# Patient Record
Sex: Male | Born: 1995 | Race: Black or African American | Hispanic: No | Marital: Single | Smoking: Current some day smoker
Health system: Southern US, Community
[De-identification: ages and names within clinical notes are randomized; demographics above are authoritative.]

## PROBLEM LIST (undated history)

## (undated) DIAGNOSIS — Q431 Hirschsprung's disease: Secondary | ICD-10-CM

## (undated) NOTE — *Deleted (*Deleted)
  Problem: Education: Goal: Knowledge of General Education information will improve Description: Including pain rating scale, medication(s)/side effects and non-pharmacologic comfort measures Outcome: Progressing   Problem: Health Behavior/Discharge Planning: Goal: Ability to manage health-related needs will improve Outcome: Progressing   Problem: Clinical Measurements: Goal: Ability to maintain clinical measurements within normal limits will improve Outcome: Progressing Goal: Will remain free from infection Outcome: Progressing Goal: Diagnostic test results will improve Outcome: Progressing Goal: Respiratory complications will improve Outcome: Progressing Goal: Cardiovascular complication will be avoided Outcome: Progressing   Problem: Activity: Goal: Risk for activity intolerance will decrease Outcome: Progressing   Problem: Nutrition: Goal: Adequate nutrition will be maintained Outcome: Progressing   Problem: Coping: Goal: Level of anxiety will decrease Outcome: Progressing   Problem: Elimination: Goal: Will not experience complications related to bowel motility Outcome: Progressing Goal: Will not experience complications related to urinary retention Outcome: Progressing   Problem: Pain Managment: Goal: General experience of comfort will improve Outcome: Progressing   Problem: Safety: Goal: Ability to remain free from injury will improve Outcome: Progressing   Problem: Skin Integrity: Goal: Risk for impaired skin integrity will decrease Outcome: Progressing   

---

## 2019-04-22 ENCOUNTER — Emergency Department (HOSPITAL_COMMUNITY): Payer: Medicaid Other

## 2019-04-22 ENCOUNTER — Other Ambulatory Visit: Payer: Self-pay

## 2019-04-22 ENCOUNTER — Inpatient Hospital Stay (HOSPITAL_COMMUNITY)
Admission: EM | Admit: 2019-04-22 | Discharge: 2019-04-25 | DRG: 389 | Disposition: A | Discharge status: Home / Self Care | Payer: Medicaid Other | Attending: Internal Medicine | Admitting: Internal Medicine

## 2019-04-22 ENCOUNTER — Encounter (HOSPITAL_COMMUNITY): Payer: Self-pay | Admitting: Emergency Medicine

## 2019-04-22 DIAGNOSIS — Z1159 Encounter for screening for other viral diseases: Secondary | ICD-10-CM

## 2019-04-22 DIAGNOSIS — R1032 Left lower quadrant pain: Secondary | ICD-10-CM | POA: Diagnosis not present

## 2019-04-22 DIAGNOSIS — D72829 Elevated white blood cell count, unspecified: Secondary | ICD-10-CM | POA: Diagnosis present

## 2019-04-22 DIAGNOSIS — K219 Gastro-esophageal reflux disease without esophagitis: Secondary | ICD-10-CM | POA: Diagnosis present

## 2019-04-22 DIAGNOSIS — E861 Hypovolemia: Secondary | ICD-10-CM | POA: Diagnosis present

## 2019-04-22 DIAGNOSIS — K5641 Fecal impaction: Secondary | ICD-10-CM | POA: Diagnosis present

## 2019-04-22 DIAGNOSIS — R000 Tachycardia, unspecified: Secondary | ICD-10-CM | POA: Diagnosis present

## 2019-04-22 DIAGNOSIS — Q431 Hirschsprung's disease: Secondary | ICD-10-CM

## 2019-04-22 DIAGNOSIS — F1290 Cannabis use, unspecified, uncomplicated: Secondary | ICD-10-CM | POA: Diagnosis present

## 2019-04-22 DIAGNOSIS — K5904 Chronic idiopathic constipation: Secondary | ICD-10-CM

## 2019-04-22 DIAGNOSIS — F419 Anxiety disorder, unspecified: Secondary | ICD-10-CM | POA: Diagnosis present

## 2019-04-22 DIAGNOSIS — E860 Dehydration: Secondary | ICD-10-CM | POA: Diagnosis present

## 2019-04-22 DIAGNOSIS — E871 Hypo-osmolality and hyponatremia: Secondary | ICD-10-CM | POA: Diagnosis present

## 2019-04-22 DIAGNOSIS — R109 Unspecified abdominal pain: Secondary | ICD-10-CM

## 2019-04-22 DIAGNOSIS — N179 Acute kidney failure, unspecified: Secondary | ICD-10-CM | POA: Diagnosis present

## 2019-04-22 DIAGNOSIS — F17210 Nicotine dependence, cigarettes, uncomplicated: Secondary | ICD-10-CM | POA: Diagnosis present

## 2019-04-22 DIAGNOSIS — K5909 Other constipation: Secondary | ICD-10-CM

## 2019-04-22 DIAGNOSIS — K5901 Slow transit constipation: Secondary | ICD-10-CM | POA: Diagnosis present

## 2019-04-22 DIAGNOSIS — Z720 Tobacco use: Secondary | ICD-10-CM

## 2019-04-22 HISTORY — DX: Hirschsprung's disease: Q43.1

## 2019-04-22 LAB — COMPREHENSIVE METABOLIC PANEL
ALT: 12 U/L (ref 0–44)
AST: 13 U/L — ABNORMAL LOW (ref 15–41)
Albumin: 3.4 g/dL — ABNORMAL LOW (ref 3.5–5.0)
Alkaline Phosphatase: 90 U/L (ref 38–126)
Anion gap: 15 (ref 5–15)
BUN: 19 mg/dL (ref 6–20)
CO2: 23 mmol/L (ref 22–32)
Calcium: 9.2 mg/dL (ref 8.9–10.3)
Chloride: 97 mmol/L — ABNORMAL LOW (ref 98–111)
Creatinine, Ser: 1.28 mg/dL — ABNORMAL HIGH (ref 0.61–1.24)
GFR calc Af Amer: >60 mL/min (ref >60)
GFR calc non Af Amer: >60 mL/min (ref >60)
Glucose, Bld: 121 mg/dL — ABNORMAL HIGH (ref 70–99)
Potassium: 5.1 mmol/L (ref 3.5–5.1)
Sodium: 135 mmol/L (ref 135–145)
Total Bilirubin: 0.5 mg/dL (ref 0.3–1.2)
Total Protein: 6.9 g/dL (ref 6.5–8.1)

## 2019-04-22 LAB — URINALYSIS, ROUTINE W REFLEX MICROSCOPIC
Bilirubin Urine: NEGATIVE
Glucose, UA: NEGATIVE mg/dL
Hgb urine dipstick: NEGATIVE
Ketones, ur: 20 mg/dL — AB
Leukocytes,Ua: NEGATIVE
Nitrite: NEGATIVE
Protein, ur: NEGATIVE mg/dL
Specific Gravity, Urine: >1.046 — ABNORMAL HIGH (ref 1.005–1.030)
pH: 5 (ref 5.0–8.0)

## 2019-04-22 LAB — CBC
HCT: 58.6 % — ABNORMAL HIGH (ref 39.0–52.0)
Hemoglobin: 19.5 g/dL — ABNORMAL HIGH (ref 13.0–17.0)
MCH: 28.3 pg (ref 26.0–34.0)
MCHC: 33.3 g/dL (ref 30.0–36.0)
MCV: 84.9 fL (ref 80.0–100.0)
Platelets: 447 10*3/uL — ABNORMAL HIGH (ref 150–400)
RBC: 6.9 MIL/uL — ABNORMAL HIGH (ref 4.22–5.81)
RDW: 13.7 % (ref 11.5–15.5)
WBC: 17.2 10*3/uL — ABNORMAL HIGH (ref 4.0–10.5)
nRBC: 0 % (ref 0.0–0.2)

## 2019-04-22 LAB — TSH: TSH: 1.342 u[IU]/mL (ref 0.350–4.500)

## 2019-04-22 LAB — LACTIC ACID, PLASMA
Lactic Acid, Venous: 2.2 mmol/L (ref 0.5–1.9)
Lactic Acid, Venous: 1.7 mmol/L (ref 0.5–1.9)

## 2019-04-22 LAB — SARS CORONAVIRUS 2 BY RT PCR (HOSPITAL ORDER, PERFORMED IN ~~LOC~~ HOSPITAL LAB): SARS Coronavirus 2: NEGATIVE

## 2019-04-22 LAB — LIPASE, BLOOD: Lipase: 16 U/L (ref 11–51)

## 2019-04-22 MED ORDER — IOHEXOL 300 MG/ML  SOLN
100.0000 mL | Freq: Once | INTRAMUSCULAR | Status: AC | PRN
  Administered 2019-04-22: 16:00:00 100 mL via INTRAVENOUS

## 2019-04-22 MED ORDER — SODIUM CHLORIDE 0.9 % IV BOLUS
1000.0000 mL | Freq: Once | INTRAVENOUS | Status: DC
  Administered 2019-04-22: 1000 mL via INTRAVENOUS

## 2019-04-22 MED ORDER — ONDANSETRON HCL 4 MG/2ML IJ SOLN: 4.0000 mg | Freq: Four times a day (QID) | INTRAMUSCULAR | Status: DC | PRN

## 2019-04-22 MED ORDER — ACETAMINOPHEN 325 MG PO TABS
650.0000 mg | ORAL_TABLET | Freq: Four times a day (QID) | ORAL | Status: DC | PRN
  Administered 2019-04-22 – 2019-04-24 (×2): 650 mg via ORAL
  Filled 2019-04-22 (×2): qty 2

## 2019-04-22 MED ORDER — ACETAMINOPHEN 650 MG RE SUPP: 650.0000 mg | Freq: Four times a day (QID) | RECTAL | Status: DC | PRN

## 2019-04-22 MED ORDER — HYDROXYZINE HCL 25 MG PO TABS: 50.0000 mg | ORAL_TABLET | Freq: Four times a day (QID) | ORAL | Status: DC | PRN

## 2019-04-22 MED ORDER — SODIUM CHLORIDE 0.9 % IV BOLUS
1000.0000 mL | Freq: Once | INTRAVENOUS | Status: AC
  Administered 2019-04-22: 17:00:00 1000 mL via INTRAVENOUS

## 2019-04-22 MED ORDER — HYDROXYZINE HCL 25 MG PO TABS
25.0000 mg | ORAL_TABLET | Freq: Four times a day (QID) | ORAL | Status: DC | PRN
  Administered 2019-04-22: 25 mg via ORAL
  Filled 2019-04-22: qty 1

## 2019-04-22 MED ORDER — ONDANSETRON HCL 4 MG PO TABS: 4.0000 mg | ORAL_TABLET | Freq: Four times a day (QID) | ORAL | Status: DC | PRN

## 2019-04-22 MED ORDER — SENNA 8.6 MG PO TABS
2.0000 | ORAL_TABLET | Freq: Two times a day (BID) | ORAL | Status: DC
  Administered 2019-04-22 – 2019-04-24 (×3): 17.2 mg via ORAL
  Filled 2019-04-22 (×4): qty 2

## 2019-04-22 MED ORDER — HEPARIN SODIUM (PORCINE) 5000 UNIT/ML IJ SOLN
5000.0000 [IU] | Freq: Three times a day (TID) | INTRAMUSCULAR | Status: DC
  Filled 2019-04-22 (×2): qty 1

## 2019-04-22 MED ORDER — POLYETHYLENE GLYCOL 3350 17 G PO PACK
17.0000 g | PACK | Freq: Once | ORAL | Status: DC
  Filled 2019-04-22: qty 1

## 2019-04-22 MED ORDER — SODIUM CHLORIDE 0.9% FLUSH: 3.0000 mL | Freq: Once | INTRAVENOUS | Status: DC

## 2019-04-22 MED ORDER — SODIUM CHLORIDE 0.9% FLUSH
3.0000 mL | Freq: Two times a day (BID) | INTRAVENOUS | Status: DC
  Administered 2019-04-22 – 2019-04-24 (×4): 3 mL via INTRAVENOUS

## 2019-04-22 MED ORDER — POLYETHYLENE GLYCOL 3350 17 G PO PACK
17.0000 g | PACK | Freq: Two times a day (BID) | ORAL | Status: DC
  Administered 2019-04-22 – 2019-04-23 (×2): 17 g via ORAL
  Filled 2019-04-22 (×2): qty 1

## 2019-04-22 NOTE — Progress Notes (Signed)
Patient's heart rate elevated 119-130 bpm. He states that he has not history of HTN or anxiety but is very anxious about being in the hospital. He denies increase abdominal pain, but admits to lower back pain. No point tenderness or CVA tenderness. Given Atarax 25mg  q6hours prn anxiety. Patient has elevated lactic acid level of 2.2.

## 2019-04-22 NOTE — ED Notes (Signed)
Attempted report- bed approval from floor pending on Covid result.

## 2019-04-22 NOTE — H&P (Addendum)
Date: 04/22/2019               Patient Name:  Thomas Rivas MRN: [id number]  DOB: 11/11/1995 Age / Sex: 23 y.o., male   PCP: Patient, No Pcp Per              Medical Service: Internal Medicine Teaching Service              Attending Physician: Dr. Inez CatalinaMullen, Emily B, MD    First Contact: Elveria Risingimelie Dulse [place], MS3 Pager: [phone]  Second Contact: Dr. Huel CoteBasaraba Pager: [phone]  Third Contact Dr. Evelene CroonSantos Pager: [phone]       After Hours (After 5p/  First Contact Pager: [phone]  weekends / holidays): Second Contact Pager: [phone]   Chief Complaint: abdominal pain  History of Present Illness: Thomas Rivas is a [age] y.o. with PMHx of Hirschsprung's disease who presents to the ED complaining of aching abdominal pain ongoing for 8 days. Patient reports intermittent, waxing and waning pain that is usually 5/10, but is 10/10 in intensity at it's worst. Patient reports treatment with GasX prior to arrival without improvement. Patient is unable to identify aggravating factors. Patient reports associated bloating, decreased appetite for the last three days due to sensation of fullness, fatigue, lower back pain; he is ablebto tolerate water. Patient has not been able to work in the last eight days due to severity of his pain. Patient denies fever, chills, nausea, vomiting, chest pain, hematochezia, fecal incontinence, numbness or weakness. Patient has a long history of severe constipation and states his last episode was a few month ago that was resolved at home with enema treatment. With his constipation he reports occasional episodes of diarrhea. He remembers last being hospitalized for his constipation at 23 years old, but is unsure of interventions. Patient reports associated acid reflux that presented in his childhood; these symptoms are worse with eating spicy foods and chocolate.  Patient denies any other medical issues.    Meds: Patient does not take any prescribed medications and denies use  of OTC drugs.  No outpatient medications have been marked as taking for the 04/22/19 encounter [facility](Hospital Encounter).     Allergies: Allergies as of 04/22/2019  . (No Known Allergies)   Past Medical History:  Diagnosis Date  . Hirschsprung's disease     Family History: Patient has one sister who is healthy and he denies any chronic illness in his parents. No one in his family has similar history of constipation.   Social History: Patient works at [employer]. States that his diet consists of "junk food" and fast food. He smokes ~2 cigarettes a day, smokes weed, and drinks socially.   Surgical History: No previous surgeries.     Review of Systems: Review of Systems  Constitutional: Negative for appetite change, chills and fever.  HENT: Negative for rhinorrhea and sore throat.   Respiratory: Negative for cough and shortness of breath.   Cardiovascular: Negative for chest pain and palpitations.  Gastrointestinal: Positive for abdominal distention, abdominal pain, constipation and diarrhea. Negative for blood in stool, nausea and vomiting.  Genitourinary: Negative for dysuria, frequency and hematuria.  Musculoskeletal: Positive for back pain. Negative for arthralgias and myalgias.  Skin: Negative for rash.  Neurological: Negative for weakness and numbness.  Hematological: Negative for adenopathy.     Physical Exam: Blood pressure 130/84, pulse 65, temperature 98.6 F (37 C), resp. rate (!) 23, SpO2 100 % on room air. Physical Exam Vitals signs reviewed.  Constitutional:  Comments: lying in bed on right side, he appears uncomfortable  HENT:     Head: Normocephalic and atraumatic.  Eyes:     Extraocular Movements: Extraocular movements intact.  Cardiovascular:     Rate and Rhythm: Tachycardia present.     Heart sounds: Normal heart sounds. No murmur.  Pulmonary:     Effort: Pulmonary effort is normal.     Breath sounds: Normal breath sounds.  Abdominal:     General: There is  distension.     Palpations: Abdomen is rigid.     Tenderness: There is abdominal tenderness.     Comments: abdomen is firm, significantly distended with taut skin  Skin:    General: Skin is warm.  Neurological:     Mental Status: He is alert and oriented to person, place, and time.      EKG: personally reviewed my interpretation is sinus tachycardia.    Abnormal Labs Reviewed  COMPREHENSIVE METABOLIC PANEL - Abnormal; Notable for the following components:      Result Value   Chloride 97 (*)    Glucose, Bld 121 (*)    Creatinine, Ser 1.28 (*)    Albumin 3.4 (*)    AST 13 (*)    All other components within normal limits  CBC - Abnormal; Notable for the following components:   WBC 17.2 (*)    RBC 6.90 (*)    Hemoglobin 19.5 (*)    HCT 58.6 (*)    Platelets 447 (*)    All other components within normal limits      Assessment & Plan by Problem: Active Problems:   Hirschsprung's disease   Tobacco use   Constipation by delayed colonic transit  Thomas Rivas is a 23 y.o. male with PMHx of Hirschsprung's disease who presents to the ED complaining of abdominal pain ongoing for 8 days with associated sensation of fullness, lower back pain, bloating, and anorexia.       Abdominal Pain      Constipation  Patient has never been formally diagnosed with Hirschsprung's disease but this is the suspected etiology that has been explained to the patient by his doctors. On admission patient's CT abdomen reveals massively distended colon from the cecum to the rectum containing an enormous stool burden, no evidence of free intraperitoneal air. Patient is hemoconcentrated with an AKI, most likely secondary to chronic dehydration and malnutrition. Lactic acid pending. Patient is tachycardiac to 155 in the ED; this is likely multifactorial resulting from the patient's pain severity, cardiac compression by his distended bowel, and compensatory response to hypovolemia.   Given the severity  of patient's colonic distention, he is at risk for perforation and massive bleeding. Per ED note, on call surgeon's recommendations are to relieve stool with enemas and oral Miralax. Patient will require monitoring for emergent complications eg acute onset/worsening of abdominal or chest pain, focal tenderness or peritoneal signs.         Leukocytosis Patient presents with leukocytosis at 17.2, though he is afebrile. This leukocytosis could be a result of acute stress response or infection as patient is at risk for enterocolitis, intra abdominal infection and sepsis. Patient may warrant further infectious work up.     Plan -telemetry -IVF for volume resuscitation -Miralax + enemas q6 hours -NPO -Tylenol for pain -Zofran for nausea -repeat CBC, CMP, lactic acid       Dispo: Admit patient to Inpatient with expected length of stay greater than 2 midnights.  Signed: Mercy Moore, Medical Student  04/22/2019, 11:24 PM  Pager: [phone]

## 2019-04-22 NOTE — ED Provider Notes (Signed)
[facility] Provider Note   CSN: [id number] Arrival date & time: 04/22/19  1427    History   Chief Complaint Chief Complaint  Patient presents with  . Abdominal Pain    HPI Thomas Rivas is a 23 y.o. male with a PMH of Hirschsprung's disease who presents with left lower quadrant abdominal pain.  He says that his pain started about 8 days ago and he lost his appetite soon after.  He denies diarrhea or vomiting but does state that he has felt nauseated.  He has also reduced his fluid intake due to nausea over the last few days.  He says that due to his Hirschsprung's disease, he has a bowel movement only when he uses an enema and does this about once every 2 weeks.  His last bowel movement was about 2 weeks ago.  He says that he will often get a distended abdomen due to his Hirschsprung's disease and his abdominal distention today as not as severe as it can sometimes be.  He is not sure if he has had a fever but does endorse feeling sweaty frequently recently.  He denies upper respiratory symptoms, chest pain, shortness of breath.  He does smoke cigarettes smokes marijuana and drinks alcohol occasionally, but he denies any other drug use.  The patient reports that his Hirschsprung disease was not diagnosed in infancy but when he was about 23 years old.  He says that he was supposed to get a rectal biopsy but was lost to follow-up.    Past Medical History:  Diagnosis Date  . Hirschsprung's disease     Patient Active Problem List   Diagnosis Date Noted  . 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 Tobacco use 04/22/2019    History reviewed. No pertinent surgical history.      Home Medications    Prior to Admission medications   Not on File    Family History No family history on file.  Social History Social History   Tobacco Use  . Smoking status: Current Every Day Smoker    Types: Cigarettes  Substance Use Topics  . Alcohol  use: Not on file  . Drug use: Not on file     Allergies   Patient has no known allergies.   Review of Systems Review of Systems  Constitutional: Positive for appetite change and diaphoresis. Negative for activity change and unexpected weight change.  HENT: Negative for congestion and sore throat.   Respiratory: Negative for cough and shortness of breath.   Cardiovascular: Negative for chest pain, palpitations and leg swelling.  Gastrointestinal: Positive for abdominal pain, constipation (chronic) and nausea. Negative for vomiting.  Genitourinary: Negative for decreased urine volume and difficulty urinating.  Musculoskeletal: Negative for back pain.  Neurological: Negative for headaches.  Psychiatric/Behavioral: The patient is nervous/anxious.      Physical Exam Updated Vital Signs BP 130/84   Pulse 65   Temp 98.6 F (37 C)   Resp (!) 24   SpO2 100%   Physical Exam Constitutional:      General: He is not in acute distress.    Appearance: He is well-developed and normal weight.  HENT:     Head: Normocephalic and atraumatic.     Mouth/Throat:     Mouth: Mucous membranes are moist.     Pharynx: No oropharyngeal exudate.  Eyes:     Extraocular Movements: Extraocular movements intact.     Pupils: Pupils are equal, round, and reactive to light.  Cardiovascular:     Rate and Rhythm: Regular rhythm. Tachycardia present.     Heart sounds: No murmur.  Pulmonary:     Effort: Pulmonary effort is normal. No respiratory distress.     Breath sounds: Normal breath sounds.  Abdominal:     General: Abdomen is protuberant. Bowel sounds are decreased. There is distension. There are no signs of injury.     Palpations: Abdomen is rigid.     Tenderness: There is abdominal tenderness in the left upper quadrant and left lower quadrant. There is no guarding or rebound.     Hernia: No hernia is present.  Skin:    General: Skin is warm and dry.  Neurological:     General: No focal  deficit present.     Mental Status: He is alert and oriented to person, place, and time.  Psychiatric:        Mood and Affect: Mood is anxious.      ED Treatments / Results  Labs (all labs ordered are listed, but only abnormal results are displayed) Labs Reviewed  COMPREHENSIVE METABOLIC PANEL - Abnormal; Notable for the following components:      Result Value   Chloride 97 (*)    Glucose, Bld 121 (*)    Creatinine, Ser 1.28 (*)    Albumin 3.4 (*)    AST 13 (*)    All other components within normal limits  CBC - Abnormal; Notable for the following components:   WBC 17.2 (*)    RBC 6.90 (*)    Hemoglobin 19.5 (*)    HCT 58.6 (*)    Platelets 447 (*)    All other components within normal limits  LIPASE, BLOOD  URINALYSIS, ROUTINE W REFLEX MICROSCOPIC  LACTIC ACID, PLASMA  LACTIC ACID, PLASMA  TSH    EKG EKG Interpretation  Date/Time:  Saturday April 22 2019 15:07:41 EDT Ventricular Rate:  152 PR Interval:    QRS Duration: 74 QT Interval:  270 QTC Calculation: 430 R Axis:   112 Text Interpretation:  Sinus tachycardia Right axis deviation Abnormal T, consider ischemia, inferior leads Borderline ST elevation, lateral leads Confirmed by Blane OharaZavitz, Joshua [id number]) on 04/22/2019 3:56:27 PM   Radiology No results found.  Procedures Procedures (including critical care time)  Medications Ordered in ED Medications  sodium chloride flush (NS) 0.9 % injection 3 mL (3 mLs Intravenous Not Given 04/22/19 1449)  sodium chloride 0.9 % bolus 1,000 mL (has no administration in time range)  sodium chloride 0.9 % bolus 1,000 mL (has no administration in time range)  iohexol (OMNIPAQUE) 300 MG/ML solution 100 mL (100 mLs Intravenous Contrast Given 04/22/19 1608)     Initial Impression / Assessment and Plan / ED Course  I have reviewed the triage vital signs and the nursing notes.  Pertinent labs & imaging results that were available during my care of the patient were reviewed by me  and considered in my medical decision making (see chart for details).       No peritoneal signs on exam, patient is very protuberant and has a rigid abdomen.  Differential includes constipation, obstruction, stercoral ulcers, enterocolitis, volvulus, and diverticulitis.  Will obtain CT abdomen/pelvis.  Also appears dehydrated on exam and has hemoconcentration on CBC.  Also has an AKI, likely due to dehydration.  Although his tachycardia is likely due to dehydration as well as anxiety, will obtain lactic acid and TSH as well.  Increased heart rate and respiratory rate also likely due to the  lack of thoracic space due to the dilation of his colon.  CT abdomen/pelvis significant for massively dilated colon and very large stool burden with impingement of abdominal viscera.  Spoke with surgeon on call, and he recommended admission to medicine with enemas every 6 hours and oral MiraLAX as well as a clear diet.  He does not think that surgery needs to be consulted on this patient during admission.  Internal medicine was consulted for admission. Final Clinical Impressions(s) / ED Diagnoses   Final diagnoses:  Abdominal pain    ED Discharge Orders    None       Lennox SoldersWinfrey, Amanda C, MD 04/22/19 [id number]    Blane OharaZavitz, Joshua, MD 04/23/19 [phone]

## 2019-04-22 NOTE — ED Triage Notes (Signed)
Pt reports LLQ pain and decreased appetite x3 days, denies chills, vomiting or diarrhea but does report some sweating.

## 2019-04-22 NOTE — ED Notes (Addendum)
Juliann Pulse 2W refuses to read hand off report in Epic, stating she did not know she was getting a patient,  will give bed side report

## 2019-04-22 NOTE — ED Notes (Signed)
Attempted report 

## 2019-04-22 NOTE — ED Notes (Signed)
Reported that pt it tachy and has untreated Hirschsprung's disease

## 2019-04-22 NOTE — ED Notes (Signed)
ED TO INPATIENT HANDOFF REPORT  ED Nurse Name and Phone #: [phone]  S Name/Age/Gender Thomas Rivas 23 y.o. male Room/Bed: 015C/015C  Code Status   Code Status: Full Code  Home/SNF/Other   Triage Complete: Triage complete  Chief Complaint left sided pain  Triage Note Pt reports LLQ pain and decreased appetite x3 days, denies chills, vomiting or diarrhea but does report some sweating.    Allergies No Known Allergies  Level of Care/Admitting Diagnosis ED Disposition    ED Disposition Condition Comment   Admit  Hospital Area: [facility] [[id number]]  Level of Care: Progressive [102]  Covid Evaluation: Confirmed COVID Negative  Diagnosis: Constipation by delayed colonic transit [671245]  Admitting Physician: Sid Falcon [phone]  Attending Physician: Sid Falcon [phone]  Estimated length of stay: past midnight tomorrow  Certification:: I certify this patient will need inpatient services for at least 2 midnights  PT Class (Do Not Modify): Inpatient [101]  PT Acc Code (Do Not Modify): Private [1]       B Medical/Surgery History Past Medical History:  Diagnosis Date  . Hirschsprung's disease    History reviewed. No pertinent surgical history.   A IV Location/Drains/Wounds Patient Lines/Drains/Airways Status   Active Line/Drains/Airways    Name:   Placement date:   Placement time:   Site:   Days:   Peripheral IV 04/22/19 Left Antecubital   04/22/19    1525    Antecubital   less than 1          Intake/Output Last 24 hours No intake or output data in the 24 hours ending 04/22/19 1805  Labs/Imaging Results for orders placed or performed during the hospital encounter of 04/22/19 (from the past 48 hour(s))  Lipase, blood     Status: None   Collection Time: 04/22/19  2:38 PM  Result Value Ref Range   Lipase 16 11 - 51 U/L    Comment: Performed at [facility], [place] [address], [place], [place] [address]  Comprehensive metabolic  panel     Status: Abnormal   Collection Time: 04/22/19  2:38 PM  Result Value Ref Range   Sodium 135 135 - 145 mmol/L   Potassium 5.1 3.5 - 5.1 mmol/L   Chloride 97 (L) 98 - 111 mmol/L   CO2 23 22 - 32 mmol/L   Glucose, Bld 121 (H) 70 - 99 mg/dL   BUN 19 6 - 20 mg/dL   Creatinine, Ser 1.28 (H) 0.61 - 1.24 mg/dL   Calcium 9.2 8.9 - 10.3 mg/dL   Total Protein 6.9 6.5 - 8.1 g/dL   Albumin 3.4 (L) 3.5 - 5.0 g/dL   AST 13 (L) 15 - 41 U/L   ALT 12 0 - 44 U/L   Alkaline Phosphatase 90 38 - 126 U/L   Total Bilirubin 0.5 0.3 - 1.2 mg/dL   GFR calc non Af Amer >60 >60 mL/min   GFR calc Af Amer >60 >60 mL/min   Anion gap 15 5 - 15    Comment: Performed at [facility] [address], [place], [place] [address]  CBC     Status: Abnormal   Collection Time: 04/22/19  2:38 PM  Result Value Ref Range   WBC 17.2 (H) 4.0 - 10.5 K/uL   RBC 6.90 (H) 4.22 - 5.81 MIL/uL   Hemoglobin 19.5 (H) 13.0 - 17.0 g/dL   HCT 58.6 (H) 39.0 - 52.0 %    Comment: REPEATED TO  VERIFY   MCV 84.9 80.0 - 100.0 fL   MCH 28.3 26.0 - 34.0 pg   MCHC 33.3 30.0 - 36.0 g/dL   RDW 16.113.7 09.611.5 - 04.515.5 %   Platelets 447 (H) 150 - 400 K/uL   nRBC 0.0 0.0 - 0.2 %    Comment: Performed at [facility], [address] [place], [place] [address]   Ct Abdomen Pelvis W Contrast  Result Date: 04/22/2019 CLINICAL DATA:  23 year old with three-day history of LEFT LOWER QUADRANT abdominal pain and diminished appetite. Personal history of Hirschsprung disease as an infant. EXAM: CT ABDOMEN AND PELVIS WITH CONTRAST TECHNIQUE: Multidetector CT imaging of the abdomen and pelvis was performed using the standard protocol following bolus administration of intravenous contrast. CONTRAST:  100mL OMNIPAQUE IOHEXOL 300 MG/ML IV. COMPARISON:  None. FINDINGS: Lower chest: Heart size normal.  Visualized lung bases clear. Hepatobiliary: Liver normal in size and appearance. Gallbladder normal in appearance without calcified gallstones. No  biliary ductal dilation. Pancreas: Normal in appearance without evidence of mass, ductal dilation, or inflammation. Spleen: Normal in size and appearance. Adrenals/Urinary Tract: Normal appearing adrenal glands. Kidneys normal in size and appearance without focal parenchymal abnormality. No hydronephrosis. No evidence of urinary tract calculi. Urinary bladder relatively decompressed, displaced anteriorly by the massively distended rectum. Stomach/Bowel: The stomach in the entire small bowel are unremarkable and are compressed to the midline by the colon which is massively distended from cecum to rectum, containing an enormous amount of stool. The colonic distension extends to just above the anal verge. The appendix is not conspicuous. Vascular/Lymphatic: No visible aortoiliofemoral atherosclerosis. Widely patent visceral arteries. Normal-appearing portal venous and systemic venous systems. No pathologic lymphadenopathy. Reproductive: Prostate gland and seminal vesicles normal in size and appearance for age. Other: No evidence free intraperitoneal air. Musculoskeletal: Regional skeleton unremarkable without acute or significant osseous abnormality. IMPRESSION: 1. Massively distended colon containing an enormous amount of stool. Rectosigmoid and descending colon fecal impaction. No evidence of free intraperitoneal air. 2. The massively distended colon causes mass effect upon the remaining abdominal viscera. 3. No acute abnormalities otherwise involving the abdomen or pelvis. Electronically Signed   By: Hulan Saashomas  Lawrence M.D.   On: 04/22/2019 16:39    Pending Labs Unresulted Labs (From admission, onward)    Start     Ordered   04/23/19 0500  CBC  Tomorrow morning,   R     04/22/19 1750   04/23/19 0500  Comprehensive metabolic panel  Tomorrow morning,   R     04/22/19 1750   04/23/19 0500  Magnesium  Tomorrow morning,   R     04/22/19 1750   04/22/19 1743  HIV antibody (Routine Testing)  Once,   STAT      04/22/19 1750   04/22/19 1556  Lactic acid, plasma  Now then every 2 hours,   STAT     04/22/19 1555   04/22/19 1556  TSH  Once,   STAT     04/22/19 1555   04/22/19 1436  Urinalysis, Routine w reflex microscopic  ONCE - STAT,   STAT     04/22/19 1435          Vitals/Pain Today's Vitals   04/22/19 1530 04/22/19 1645 04/22/19 1715 04/22/19 1802  BP:      Pulse:      Resp: (!) 24 16 (!) 23 18  Temp:      SpO2:      PainSc:  Isolation Precautions No active isolations  Medications Medications  heparin injection 5,000 Units (has no administration in time range)  sodium chloride flush (NS) 0.9 % injection 3 mL (has no administration in time range)  polyethylene glycol (MIRALAX / GLYCOLAX) packet 17 g (has no administration in time range)  acetaminophen (TYLENOL) tablet 650 mg (has no administration in time range)    Or  acetaminophen (TYLENOL) suppository 650 mg (has no administration in time range)  senna (SENOKOT) tablet 17.2 mg (has no administration in time range)  ondansetron (ZOFRAN) tablet 4 mg (has no administration in time range)    Or  ondansetron (ZOFRAN) injection 4 mg (has no administration in time range)  sodium chloride 0.9 % bolus 1,000 mL (1,000 mLs Intravenous New Bag/Given 04/22/19 1650)  iohexol (OMNIPAQUE) 300 MG/ML solution 100 mL (100 mLs Intravenous Contrast Given 04/22/19 1608)    Mobility  Low fall risk   Focused Assessments   R Recommendations: See Admitting Provider Note  Report given to:   Additional Notes: [id number]

## 2019-04-23 ENCOUNTER — Other Ambulatory Visit: Payer: Self-pay

## 2019-04-23 ENCOUNTER — Encounter (HOSPITAL_COMMUNITY): Payer: Self-pay

## 2019-04-23 DIAGNOSIS — K5900 Constipation, unspecified: Secondary | ICD-10-CM

## 2019-04-23 LAB — CBC
HCT: 47.8 % (ref 39.0–52.0)
Hemoglobin: 16.2 g/dL (ref 13.0–17.0)
MCH: 28.4 pg (ref 26.0–34.0)
MCHC: 33.9 g/dL (ref 30.0–36.0)
MCV: 83.9 fL (ref 80.0–100.0)
Platelets: 392 K/uL (ref 150–400)
RBC: 5.7 MIL/uL (ref 4.22–5.81)
RDW: 13.2 % (ref 11.5–15.5)
WBC: 23.7 K/uL — ABNORMAL HIGH (ref 4.0–10.5)
nRBC: 0 % (ref 0.0–0.2)

## 2019-04-23 LAB — COMPREHENSIVE METABOLIC PANEL
ALT: 14 U/L (ref 0–44)
AST: 17 U/L (ref 15–41)
Albumin: 2.9 g/dL — ABNORMAL LOW (ref 3.5–5.0)
Alkaline Phosphatase: 103 U/L (ref 38–126)
Anion gap: 11 (ref 5–15)
BUN: 19 mg/dL (ref 6–20)
CO2: 22 mmol/L (ref 22–32)
Calcium: 8.4 mg/dL — ABNORMAL LOW (ref 8.9–10.3)
Chloride: 102 mmol/L (ref 98–111)
Creatinine, Ser: 0.98 mg/dL (ref 0.61–1.24)
GFR calc Af Amer: >60 mL/min (ref >60)
GFR calc non Af Amer: >60 mL/min (ref >60)
Glucose, Bld: 117 mg/dL — ABNORMAL HIGH (ref 70–99)
Potassium: 4.5 mmol/L (ref 3.5–5.1)
Sodium: 135 mmol/L (ref 135–145)
Total Bilirubin: 0.5 mg/dL (ref 0.3–1.2)
Total Protein: 5.7 g/dL — ABNORMAL LOW (ref 6.5–8.1)

## 2019-04-23 LAB — LACTIC ACID, PLASMA: Lactic Acid, Venous: 1.6 mmol/L (ref 0.5–1.9)

## 2019-04-23 LAB — HIV ANTIBODY (ROUTINE TESTING W REFLEX): HIV Screen 4th Generation wRfx: NONREACTIVE

## 2019-04-23 LAB — MAGNESIUM: Magnesium: 2 mg/dL (ref 1.7–2.4)

## 2019-04-23 MED ORDER — LACTATED RINGERS IV SOLN
Freq: Once | INTRAVENOUS | Status: DC
  Administered 2019-04-23: 16:00:00 via INTRAVENOUS

## 2019-04-23 MED ORDER — BISACODYL 10 MG RE SUPP
10.0000 mg | Freq: Once | RECTAL | Status: DC
  Filled 2019-04-23: qty 1

## 2019-04-23 MED ORDER — FLEET ENEMA 7-19 GM/118ML RE ENEM
1.0000 | ENEMA | Freq: Four times a day (QID) | RECTAL | Status: DC
  Administered 2019-04-23: 1 via RECTAL
  Filled 2019-04-23 (×2): qty 1

## 2019-04-23 NOTE — Progress Notes (Signed)
Bedside report received from Autum RN from the emergency department.  Patient arrived to unit around 2045 on 04/22/19, alert and oriented x 4.  He complained of lower back pain as well as left lower abdomen pain.  Vitals were Hypertensive and tachycardia noted at 120-140 bpm.  Internal Medicine notified and provided orders to treat his anxiety.  Patient due to q6 hour soap suds enemas, he refused the midnight order.  Educated on rationale to comply with each enema order.  He verbalized understanding.  Patient attempted enema this am without success.  Will continue to with future intervention.

## 2019-04-23 NOTE — Progress Notes (Addendum)
Received a page from Mr. Defalco's nurse that most recent bladder scan was greater than 800cc. I spent 10 minutes with the patient at the bedside explaining the importance of helping him urinate since he is obstructed. However, he is insistent that he does not try in and out catheterization, or foley placement to help him urinate. He says that he "knows his body" and all he needs to do is drink water to make himself go.  I explained to him that until we are able to relieve the obstruction, drinking more water will not help.  I also noted that if were not able to get him to pee he runs the risk of injuring his bladder permanently.  He says he understands but he still does not want any in and out catheterization or foley placement.   Earlene Plater, MD Internal Medicine, PGY1 Pager: [phone]  04/23/2019,4:53 PM

## 2019-04-23 NOTE — Plan of Care (Signed)

## 2019-04-23 NOTE — Progress Notes (Signed)
   Subjective:   Patient reports feeling a bit better this morning. He is still feeling very distended, denies abdominal pain, nausea, vomiting, SOB, chest pain, palpitations, rectal bleeding. Confirms he is passing gas. Tolerating PO fluids.   He did not do his overnight enema. He attempted to do an enema around 6 am but was unsuccessful. He will try again this morning with the help of his nurse. He does not want to do Dulcolax though, as it scares him. He prefers Fleet enema and says it always works. States Miralax never works for him.   Objective:  Vital signs in last 24 hours: Vitals:   04/22/19 2045 04/22/19 2300 04/23/19 0535 04/23/19 0728  BP: (!) 154/101 (!) 157/134    Pulse: (!) 145     Resp:      Temp: 98.3 F (36.8 C) 98 F (36.7 C) 98.3 F (36.8 C)   TempSrc: Oral Oral Oral   SpO2:      Weight:    60.1 kg  Height:    5\' 10"  (1.778 m)   Physical Exam  Constitutional: He is oriented to person, place, and time. He appears well-developed and well-nourished. No distress.  HENT:  Head: Normocephalic and atraumatic.  Cardiovascular: Regular rhythm and normal heart sounds. Tachycardia present. Exam reveals no gallop.  No murmur heard. Pulmonary/Chest: Breath sounds normal. No accessory muscle usage. No respiratory distress.  Abdominal: He exhibits distension (Extremely distended on exam. No improvement since yesterday). There is no abdominal tenderness. There is no rebound.  Musculoskeletal:        General: No tenderness, deformity or edema.  Neurological: He is alert and oriented to person, place, and time.  Skin: Skin is warm and dry. He is not diaphoretic.  Psychiatric: He has a normal mood and affect. His speech is normal.  Nursing note and vitals reviewed.  Labs:   WBC: 23.7 (yesterday: 17.2) Lactic: 1.6  Albumin: 2.9  Assessment/Plan:  Active Problems:   Hirschsprung's disease   Tobacco use   Constipation by delayed colonic transit  #Abdominal  Distention, Hx of Hirschsprung's Disease (not confirmed by biopsy) Abdominal distention has not improved at all, however patient is no longer having abdominal pain. No fevers.  Patient remains tachycardic but improved from the 150s to the 130s.  Blood pressure has improved as well, likely from pain alleviation. He denies his enema last night   -Fleet enemas q6h  -Senna and Miralax BID  -Zofran PRN for nausea  -Telemetry for sinus tachycardia -Hydroxyzine 25mg  q6h PRN for anxiety  #Leukocytosis:  Worsening overnight, increasing from 17 to 23.  May be secondary to continued severe constipation.  No overt signs of peritonitis.  Afebrile.  No infectious sources on exam, likely reactive and/or inflammatory. Given risk of bowel perforation from severe colonic distention, low threshold for starting antibiotics should patient have a fever.   -CBC tomorrow  -Monitor closely for febrile events   Dispo: Anticipated discharge pending successful GI clean out.    Dr. Jose Persia Internal Medicine PGY-1  Pager: [phone] 04/23/2019, 7:59 AM

## 2019-04-23 NOTE — Progress Notes (Signed)
Paged by RN regarding Thomas Rivas's request for telemetry to be discontinued. Examined and evaluated him at bedside. He had multiple episodes of stool incontinence when getting off the bed and his bed and hospital room floor was noted to be covered in feces. Requesting telemtry leads to be off so he could take a shower. Discussed with Thomas Rivas regarding his persistent HR >140 and desire to monitor his rhythm. Thomas Rivas expressed understanding and is agreeable to resume telemetry after his shower.

## 2019-04-24 ENCOUNTER — Other Ambulatory Visit: Payer: Self-pay

## 2019-04-24 DIAGNOSIS — D72829 Elevated white blood cell count, unspecified: Secondary | ICD-10-CM

## 2019-04-24 DIAGNOSIS — R140 Abdominal distension (gaseous): Secondary | ICD-10-CM

## 2019-04-24 DIAGNOSIS — Z1159 Encounter for screening for other viral diseases: Secondary | ICD-10-CM

## 2019-04-24 DIAGNOSIS — R339 Retention of urine, unspecified: Secondary | ICD-10-CM

## 2019-04-24 DIAGNOSIS — K5909 Other constipation: Secondary | ICD-10-CM

## 2019-04-24 DIAGNOSIS — Q431 Hirschsprung's disease: Secondary | ICD-10-CM

## 2019-04-24 DIAGNOSIS — K5641 Fecal impaction: Principal | ICD-10-CM

## 2019-04-24 LAB — CBC WITH DIFFERENTIAL/PLATELET
Abs Immature Granulocytes: 0.31 10*3/uL — ABNORMAL HIGH (ref 0.00–0.07)
Basophils Absolute: 0.1 10*3/uL (ref 0.0–0.1)
Basophils Relative: 0 %
Eosinophils Absolute: 0.1 10*3/uL (ref 0.0–0.5)
Eosinophils Relative: 1 %
HCT: 43.9 % (ref 39.0–52.0)
Hemoglobin: 15 g/dL (ref 13.0–17.0)
Immature Granulocytes: 1 %
Lymphocytes Relative: 7 %
Lymphs Abs: 1.5 10*3/uL (ref 0.7–4.0)
MCH: 28.3 pg (ref 26.0–34.0)
MCHC: 34.2 g/dL (ref 30.0–36.0)
MCV: 82.8 fL (ref 80.0–100.0)
Monocytes Absolute: 1.7 10*3/uL — ABNORMAL HIGH (ref 0.1–1.0)
Monocytes Relative: 7 %
Neutro Abs: 19 10*3/uL — ABNORMAL HIGH (ref 1.7–7.7)
Neutrophils Relative %: 84 %
Platelets: 372 10*3/uL (ref 150–400)
RBC: 5.3 MIL/uL (ref 4.22–5.81)
RDW: 13.2 % (ref 11.5–15.5)
WBC: 22.7 10*3/uL — ABNORMAL HIGH (ref 4.0–10.5)
nRBC: 0 % (ref 0.0–0.2)

## 2019-04-24 LAB — BASIC METABOLIC PANEL
Anion gap: 9 (ref 5–15)
BUN: 15 mg/dL (ref 6–20)
CO2: 26 mmol/L (ref 22–32)
Calcium: 8 mg/dL — ABNORMAL LOW (ref 8.9–10.3)
Chloride: 99 mmol/L (ref 98–111)
Creatinine, Ser: 0.93 mg/dL (ref 0.61–1.24)
GFR calc Af Amer: >60 mL/min (ref >60)
GFR calc non Af Amer: >60 mL/min (ref >60)
Glucose, Bld: 91 mg/dL (ref 70–99)
Potassium: 3.8 mmol/L (ref 3.5–5.1)
Sodium: 134 mmol/L — ABNORMAL LOW (ref 135–145)

## 2019-04-24 MED ORDER — METOCLOPRAMIDE HCL 5 MG/ML IJ SOLN: 10.0000 mg | Freq: Four times a day (QID) | INTRAMUSCULAR | Status: DC | PRN

## 2019-04-24 MED ORDER — LACTATED RINGERS IV SOLN
INTRAVENOUS | Status: DC
  Administered 2019-04-24: 13:00:00 via INTRAVENOUS

## 2019-04-24 MED ORDER — POLYETHYLENE GLYCOL 3350 17 GM/SCOOP PO POWD
1.0000 | Freq: Once | ORAL | Status: AC
  Administered 2019-04-24: 255 g via ORAL
  Filled 2019-04-24: qty 255

## 2019-04-24 NOTE — Progress Notes (Signed)
Notified by telemetry of pt pulse between 160-170, then tele removed. When room entered, pt standing nude at the bedside with all monitoring wires disconnected. Pt stated he wanted to take a shower. When informed that a doctor's order was required to DC telemetry, especially in light off his current cardiac status, the pt verbally expressed displeasure at the rules of the facility. MD paged, informed of pt status. Pt requested discharge papers, stated he wanted to leave. MD paged to be informed of pt request- before page answered, MD on floor to assess pt.

## 2019-04-24 NOTE — Progress Notes (Signed)
MD notified pt heart rate reaching 130s unsustained. Orders for call parameters requested.  Will continue to monitor.

## 2019-04-24 NOTE — Progress Notes (Signed)
Pt refusing morning enema.  Pt educated about importance of q6 enemas.  Bladder scan showing 707 mL. RN discussed possible I/O cath to which patient stated he already discussed with doctors how he would not have I/O cath.

## 2019-04-24 NOTE — Consult Note (Addendum)
[facility] Consult: 12:27 PM 04/24/2019  LOS: 2 days    Referring Provider: Dr Rogelia BogaButcher  Primary Care Physician:  Patient, No Pcp Per Primary Gastroenterologist:  unassigned     Reason for Consultation:  ?  Hirschsprung's disease.   HPI: Thomas StanleyMalik Hakeem Rivas is a 23 y.o. male.   At 23 years old patient was told he had Hirschsprung's.  [place] in [place] with obstipation, constipation, distention which had been chronic.  Treated with enemas and discharged.  He was to have follow-up rectal biopsies [facility] but when he arrived for bx, the case was canceled because he had had eaten ice cream.  Lacking healthcare insurance he never followed up.   Manages his chronic, severe constipation with enemas periodically.  His pattern of bowel movements is sometimes to go for 3 or 4 weeks without a BM.  Sometimes his bowels will start moving spontaneously and for a week or so he will have 2 or 3 bowel movements within the week.  If he uses an enema he will have bowel movements.  Never sees blood in his stool.  When he gets constipated he develops abdominal distention with at most mild discomfort.  He never has nausea or vomiting or inability to eat/drink or anorexia.  His weight is stable.  Even during times of belonged constipation, he is still able to pass flatus, sometimes excessively.  Leading up to this admission, patient had not had a bowel movement for ~ 3 weeks and developed increasing abdominal distention and pain beyond what he is used to.  Denies nausea, vomiting..  By the time he presented to the ED on 7/18 he was dehydrated with a creatinine of 1.2 which resolved with IV fluids.  Slightly hyponatremic at 134.  Albumin 2.9 but otherwise LFTs normal. WBCs 23.7 >> 22.7.  No anemia. CTAP with contrast showed  massive colonic distention with an "enormous" amount of stool.  Rectosigmoid and descending colon fecal impaction.  Colonic distention causing mass-effect on remaining abdominal viscera. This morning he has 700 mL's of retained urine on a bladder scan. Has had tachycardia in  130s to 140 at times.  Medical team ordered q 6 hour fleets enemas, BID MiraLAX, Senokot.  However he has received just one fleets enema yesterday as well as 1 soapsuds enema.  These measures resulted in large amount of stool output, distention is better.  Currently he can tell that he still has quite a bit more stool to evacuate if his current pattern is consistent with previous stool pattern.  Family history Patient says that his father may have had Hirschsprung's but that he had some sort of intestinal problem.  No colon cancer. Social history Patient works at [employer] on [address]    Past Medical History:  Diagnosis Date  . Hirschsprung's disease     History reviewed. No pertinent surgical history.  Prior to Admission medications   Not on File    Scheduled Meds: . heparin  5,000 Units Subcutaneous Q8H  . polyethylene glycol  17 g Oral BID  .  senna  2 tablet Oral BID  . sodium chloride flush  3 mL Intravenous Q12H  . sodium phosphate  1 enema Rectal Q6H   Infusions: . lactated ringers 75 mL/hr at 04/23/19 1544   PRN Meds: acetaminophen **OR** acetaminophen, hydrOXYzine, ondansetron **OR** ondansetron (ZOFRAN) IV   Allergies as of 04/22/2019  . (No Known Allergies)    History reviewed. No pertinent family history.  Social History   Socioeconomic History  . Marital status: Single    Spouse name: Not on file  . Number of children: Not on file  . Years of education: Not on file  . Highest education level: Not on file  Occupational History  . Not on file  Social Needs  . Financial resource strain: Not on file  . Food insecurity    Worry: Not on file    Inability: Not on file  .  Transportation needs    Medical: Not on file    Non-medical: Not on file  Tobacco Use  . Smoking status: Current Every Day Smoker    Types: Cigarettes  . Smokeless tobacco: Never Used  Substance and Sexual Activity  . Alcohol use: Not on file  . Drug use: Not on file  . Sexual activity: Not on file  Lifestyle  . Physical activity    Days per week: Not on file    Minutes per session: Not on file  . Stress: Not on file  Relationships  . Social Herbalist on phone: Not on file    Gets together: Not on file    Attends religious service: Not on file    Active member of club or organization: Not on file    Attends meetings of clubs or organizations: Not on file    Relationship status: Not on file  . Intimate partner violence    Fear of current or ex partner: Not on file    Emotionally abused: Not on file    Physically abused: Not on file    Forced sexual activity: Not on file  Other Topics Concern  . Not on file  Social History Narrative  . Not on file    REVIEW OF SYSTEMS: Constitutional: Patient feels well.  Denies weakness and dizziness. ENT:  No nose bleeds Pulm: No trouble breathing. CV: Not subjectively aware of tachycardia or palpitations.  No LE edema.  No chest pain. GU:  No hematuria, no frequency.  Sometimes has difficulty voiding GI: See HPI.  No heartburn, no dysphagia. Heme: No excessive or unusual bleeding or bruising. Transfusions: None Neuro:  No dizziness, headaches.  No peripheral tingling or numbness Derm:  No itching, no rash or sores.  Endocrine:  No sweats or chills.  No polyuria or dysuria Immunization: Not queried Travel:  None beyond local counties in last few months.    PHYSICAL EXAM: Vital signs in last 24 hours: Vitals:   04/24/19 0730 04/24/19 1145  BP: 128/84 117/81  Pulse: (!) 110 97  Resp: 17 20  Temp: 98.1 F (36.7 C) 97.7 F (36.5 C)  SpO2:     Wt Readings from Last 3 Encounters:  04/23/19 60.1 kg    General:  Pleasant, comfortable appearing, non-ill-appearing. Head: No facial asymmetry or swelling.  No signs of head trauma. Eyes: No scleral icterus, no conjunctival pallor. Ears: Not hard of hearing. Nose: No congestion or discharge. Mouth: Oropharynx moist, pink, clear.  Good dentition.  Tongue midline. Neck: No JVD, no masses, no scleral icterus. Lungs: Clear  bilaterally no labored breathing or cough. Heart: Tachy, regular.  No MRG. Abdomen: Slightly distended, quite firm.  Not tender.  Bowel sounds diminished but not tinkling or tympanitic.  No organomegaly, bruits, hernias appreciated..   Rectal: Deferred. Musc/Skeltl: No joint redness, swelling or gross deformity. Extremities: No CCE. Neurologic: Alert.  Appropriate.  Oriented x3.  Moves all 4 limbs.  No tremor, no gross weakness or neurologic deficits. Skin: No rash, no sores, no suspicious lesions. Tattoos: Yes on the upper body. Nodes: No cervical adenopathy. Psych: Calm, pleasant, cooperative.  Intake/Output from previous day: 07/19 0701 - 07/20 0700 In: 18.4 [I.V.:18.4] Out: -  Intake/Output this shift: No intake/output data recorded.  LAB RESULTS: Recent Labs    04/22/19 1438 04/23/19 0808 04/24/19 0719  WBC 17.2* 23.7* 22.7*  HGB 19.5* 16.2 15.0  HCT 58.6* 47.8 43.9  PLT 447* 392 372   BMET Lab Results  Component Value Date   NA 134 (L) 04/24/2019   NA 135 04/23/2019   NA 135 04/22/2019   K 3.8 04/24/2019   K 4.5 04/23/2019   K 5.1 04/22/2019   CL 99 04/24/2019   CL 102 04/23/2019   CL 97 (L) 04/22/2019   CO2 26 04/24/2019   CO2 22 04/23/2019   CO2 23 04/22/2019   GLUCOSE 91 04/24/2019   GLUCOSE 117 (H) 04/23/2019   GLUCOSE 121 (H) 04/22/2019   BUN 15 04/24/2019   BUN 19 04/23/2019   BUN 19 04/22/2019   CREATININE 0.93 04/24/2019   CREATININE 0.98 04/23/2019   CREATININE 1.28 (H) 04/22/2019   CALCIUM 8.0 (L) 04/24/2019   CALCIUM 8.4 (L) 04/23/2019   CALCIUM 9.2 04/22/2019   LFT Recent Labs     04/22/19 1438 04/23/19 0808  PROT 6.9 5.7*  ALBUMIN 3.4* 2.9*  AST 13* 17  ALT 12 14  ALKPHOS 90 103  BILITOT 0.5 0.5   PT/INR No results found for: INR, PROTIME Hepatitis Panel No results for input(s): HEPBSAG, HCVAB, HEPAIGM, HEPBIGM in the last 72 hours. C-Diff No components found for: CDIFF Lipase     Component Value Date/Time   LIPASE 16 04/22/2019 1438    Drugs of Abuse  No results found for: LABOPIA, COCAINSCRNUR, LABBENZ, AMPHETMU, THCU, LABBARB   RADIOLOGY STUDIES: Ct Abdomen Pelvis W Contrast  Result Date: 04/22/2019 CLINICAL DATA:  23 year old with three-day history of LEFT LOWER QUADRANT abdominal pain and diminished appetite. Personal history of Hirschsprung disease as an infant. EXAM: CT ABDOMEN AND PELVIS WITH CONTRAST TECHNIQUE: Multidetector CT imaging of the abdomen and pelvis was performed using the standard protocol following bolus administration of intravenous contrast. CONTRAST:  100mL OMNIPAQUE IOHEXOL 300 MG/ML IV. COMPARISON:  None. FINDINGS: Lower chest: Heart size normal.  Visualized lung bases clear. Hepatobiliary: Liver normal in size and appearance. Gallbladder normal in appearance without calcified gallstones. No biliary ductal dilation. Pancreas: Normal in appearance without evidence of mass, ductal dilation, or inflammation. Spleen: Normal in size and appearance. Adrenals/Urinary Tract: Normal appearing adrenal glands. Kidneys normal in size and appearance without focal parenchymal abnormality. No hydronephrosis. No evidence of urinary tract calculi. Urinary bladder relatively decompressed, displaced anteriorly by the massively distended rectum. Stomach/Bowel: The stomach in the entire small bowel are unremarkable and are compressed to the midline by the colon which is massively distended from cecum to rectum, containing an enormous amount of stool. The colonic distension extends to just above the anal verge. The appendix is not conspicuous.  Vascular/Lymphatic: No visible aortoiliofemoral atherosclerosis. Widely patent visceral arteries.  Normal-appearing portal venous and systemic venous systems. No pathologic lymphadenopathy. Reproductive: Prostate gland and seminal vesicles normal in size and appearance for age. Other: No evidence free intraperitoneal air. Musculoskeletal: Regional skeleton unremarkable without acute or significant osseous abnormality. IMPRESSION: 1. Massively distended colon containing an enormous amount of stool. Rectosigmoid and descending colon fecal impaction. No evidence of free intraperitoneal air. 2. The massively distended colon causes mass effect upon the remaining abdominal viscera. 3. No acute abnormalities otherwise involving the abdomen or pelvis. Electronically Signed   By: Hulan Saashomas  Lawrence M.D.   On: 04/22/2019 16:39     IMPRESSION:   *   Chronic obstipation, constipation.  Patient advised he may have Hirschsprung's disease at age 23 but never ended up getting rectal biopsy.  He has been self managing with periodic enemas and adapted to having constipation lasting 4+ weeks at times.  *  Lack of health care insurance.    PLAN:     *      Dr. Lavon PaganiniNandigam will be seeing the patient. ?  Pursue contrast enema vs rectal biopsy vs manometry?  *   Advance diet if he tolerates current clears    Jennye MoccasinSarah Gribbin  04/24/2019, 12:27 PM Phone [phone]   Attending physician's note   I have taken a history, examined the patient and reviewed the chart. I agree with the Advanced Practitioner's note, impression and recommendations.  23 yr M with chronic constipation and diagnosed with ?Hirschsprung's at age 23, no history of surgery or intervention??  Leucocytosis with left shift Colon severely distended with fecal impaction  Continue bowel purge with Miralax, dulcolax suppository and enema.  Continue liquid diet as tolerated  Will plan for anorectal manometry to evaluate chronic severe constipation and  exclude or confirm diagnosis of Hirschsprung's. [facility] available [name], can be performed as outpatient on discharge  Will need full thickness rectal biopsies to confirm the diagnosis of Hirschsprung's, mucosal biopsies taken during colonoscopy will be inadequate and non diagnostic.       Iona BeardK. Veena Nandigam , MD [phone]

## 2019-04-24 NOTE — Progress Notes (Signed)
  Date: 04/24/2019  Patient name: Koby Pickup [facility]  Medical record number: [id number]  Date of birth: 21-Dec-1995   I have seen and evaluated this patient and I have discussed the plan of care with the house staff. Please see their note for complete details. I concur with their findings with the following additions/corrections: Mr Murchinson was seen this morning on team rounds.  He has used one enema and has had some stool output along with some urine output.  He subjectively feels better and is ready to try some oral intake.  His tachycardia has improved and he remains afebrile.  He has positive bowel sounds and per Dr Charleen Kirks is less distended and tense today.  We are continuing current treatment and have consulted GI.  To diagnose HD, he will need rectal biopsies and surgical consultation to see if he would be a surgical candidate.  However, his colon is so severely distended that it will need time to decompress.  Bartholomew Crews, MD 04/24/2019, 1:51 PM

## 2019-04-24 NOTE — Progress Notes (Signed)
Subjective:   Thomas Rivas reports that he is feeling much better today.  He had multiple bowel movements overnight after 2 enemas.  He endorses some rib pain due to abdominal distention.  He denies abdominal pain, nausea, vomiting, shortness of breath, palpitations.  Objective:  Vital signs in last 24 hours: Vitals:   04/23/19 2047 04/23/19 2351 04/24/19 0351 04/24/19 0730  BP: 134/79 (!) 133/93 131/89 128/84  Pulse: (!) 122 (!) 120 (!) 111 (!) 110  Resp: 20 19 18 17   Temp: 98.9 F (37.2 C) 98.1 F (36.7 C) 98.3 F (36.8 C) 98.1 F (36.7 C)  TempSrc: Oral Oral Oral Oral  SpO2: 100% 100% 100%   Weight:      Height:       Physical Exam  Constitutional: He is oriented to person, place, and time. He appears well-developed and well-nourished. No distress.  HENT:  Head: Normocephalic and atraumatic.  Cardiovascular: Regular rhythm. Tachycardia present.  No murmur heard. Pulmonary/Chest: Effort normal and breath sounds normal. No respiratory distress. He has no wheezes. He has no rales.  Abdominal: He exhibits distension (Significantly improved from yesterday). There is no abdominal tenderness. There is no rebound and no guarding.  Musculoskeletal:        General: No tenderness, deformity or edema.  Neurological: He is alert and oriented to person, place, and time.  Skin: Skin is warm and dry. No rash noted. He is not diaphoretic. No erythema.  Psychiatric: He has a normal mood and affect. His behavior is normal.   Labs:  WBC: 22.7 (23.7)   Assessment/Plan:  Active Problems:   Hirschsprung's disease   Tobacco use   Constipation by delayed colonic transit  #Abdominal Distention, Hx of Hirschsprung's Disease (not confirmed by biopsy) Abdominal distention has significantly improved since patient had multiple bowel movements after 2 enemas in the past 24 hours, one was a Fleet enema and one was soapsuds.  He reports he is feeling much better and less distended.  We will continue  enemas every 6 hours.  Telemetry was discontinued due to patient's discomfort.  He is starting to feel hungry and would like to try advancing his diet from n.p.o. to clears.   -Soapsuds enemas q6h  -Senna and Miralax BID  -Zofran PRN for nausea  -Hydroxyzine 25mg  q6h PRN for anxiety  -Advance diet to clears  -GI consulted and we appreciate their input. Dr. Glyn Ade noted that Dr. Silverio Decamp will be seeing the patient. She also noted workup may require contrast enema vs rectal biopsy vs manometry.    #Leukocytosis:  WBC has not improved significantly, however this is understandable as patient just had first bowel movement this morning.  He continues to be afebrile without any signs of peritonitis. Given risk of bowel perforation from severe colonic distention, low threshold for starting antibiotics should patient have a fever.   -CBC daily to trend WBC -Monitor closely for febrile events   #Urinary Retention: Urinary retention was noted on bladder scan this morning which showed 800 cc.  Could be secondary to bowel distention.  Patient refused an in and out cath and insisted he can urinate on his own.  When checking in on him this morning, he confirms he had a large urine output right before.  We will continue to BladderScan him though.   -Bladder scan q8h -Discuss in and out cath further with patient if continues to not have adequate output.     Dispo: Anticipated discharge pending bowel clean out.   Dr.  Verdene LennertIulia Hezekiah Veltre Internal Medicine PGY-1  Pager: [phone] 04/24/2019, 3:23 PM

## 2019-04-25 DIAGNOSIS — R000 Tachycardia, unspecified: Secondary | ICD-10-CM

## 2019-04-25 DIAGNOSIS — K5904 Chronic idiopathic constipation: Secondary | ICD-10-CM

## 2019-04-25 LAB — BASIC METABOLIC PANEL
Anion gap: 7 (ref 5–15)
BUN: 8 mg/dL (ref 6–20)
CO2: 31 mmol/L (ref 22–32)
Calcium: 8.1 mg/dL — ABNORMAL LOW (ref 8.9–10.3)
Chloride: 98 mmol/L (ref 98–111)
Creatinine, Ser: 0.82 mg/dL (ref 0.61–1.24)
GFR calc Af Amer: >60 mL/min (ref >60)
GFR calc non Af Amer: >60 mL/min (ref >60)
Glucose, Bld: 116 mg/dL — ABNORMAL HIGH (ref 70–99)
Potassium: 3.7 mmol/L (ref 3.5–5.1)
Sodium: 136 mmol/L (ref 135–145)

## 2019-04-25 LAB — CBC WITH DIFFERENTIAL/PLATELET
Abs Immature Granulocytes: 0.11 10*3/uL — ABNORMAL HIGH (ref 0.00–0.07)
Basophils Absolute: 0 10*3/uL (ref 0.0–0.1)
Basophils Relative: 0 %
Eosinophils Absolute: 0.1 10*3/uL (ref 0.0–0.5)
Eosinophils Relative: 1 %
HCT: 44.2 % (ref 39.0–52.0)
Hemoglobin: 14.7 g/dL (ref 13.0–17.0)
Immature Granulocytes: 1 %
Lymphocytes Relative: 12 %
Lymphs Abs: 1.4 10*3/uL (ref 0.7–4.0)
MCH: 27.9 pg (ref 26.0–34.0)
MCHC: 33.3 g/dL (ref 30.0–36.0)
MCV: 83.9 fL (ref 80.0–100.0)
Monocytes Absolute: 1 10*3/uL (ref 0.1–1.0)
Monocytes Relative: 8 %
Neutro Abs: 9.6 10*3/uL — ABNORMAL HIGH (ref 1.7–7.7)
Neutrophils Relative %: 78 %
Platelets: 343 10*3/uL (ref 150–400)
RBC: 5.27 MIL/uL (ref 4.22–5.81)
RDW: 13.3 % (ref 11.5–15.5)
WBC: 12.3 10*3/uL — ABNORMAL HIGH (ref 4.0–10.5)
nRBC: 0 % (ref 0.0–0.2)

## 2019-04-25 NOTE — Plan of Care (Signed)
  Problem: Education: Goal: Knowledge of General Education information will improve Description Including pain rating scale, medication(s)/side effects and non-pharmacologic comfort measures Outcome: Progressing   

## 2019-04-25 NOTE — Progress Notes (Addendum)
          Daily Rounding Note  04/25/2019, 12:14 PM  LOS: 3 days   SUBJECTIVE:   Chief complaint: Obstipation, constipation. Patient has been having liquid bowel movements.  3 or 4 so far today, not large volumes.  Abdominal distention and pain feels better.  No nausea or vomiting.  Advancing from full liquids to soft diet for lunch today.  OBJECTIVE:         Vital signs in last 24 hours:    Temp:  [97.7 F (36.5 C)-98 F (36.7 C)] 97.9 F (36.6 C) (07/21 0721) Pulse Rate:  [96-110] 96 (07/21 0721) Resp:  [14-20] 20 (07/21 0721) BP: (116-123)/(73-92) 123/92 (07/21 0721) SpO2:  [99 %-100 %] 99 % (07/21 0721) Weight:  [60 kg] 60 kg (07/21 0300) Last BM Date: 04/25/19 Filed Weights   04/23/19 0728 04/25/19 0300  Weight: 60.1 kg 60 kg   General: NAD. Heart: RRR Chest: Labored breathing, clear. Abdomen: Still somewhat firm, nontender.  Very slight distention active bowel sounds. Extremities: No CCE. Neuro/Psych: Oriented x3.  No gross weakness.  Intake/Output from previous day: 07/20 0701 - 07/21 0700 In: 1131.3 [P.O.:500; I.V.:631.3] Out: -   Intake/Output this shift: No intake/output data recorded.  Lab Results: Recent Labs    04/22/19 1438 04/23/19 0808 04/24/19 0719  WBC 17.2* 23.7* 22.7*  HGB 19.5* 16.2 15.0  HCT 58.6* 47.8 43.9  PLT 447* 392 372   BMET Recent Labs    04/22/19 1438 04/23/19 0808 04/24/19 0719  NA 135 135 134*  K 5.1 4.5 3.8  CL 97* 102 99  CO2 23 22 26   GLUCOSE 121* 117* 91  BUN 19 19 15   CREATININE 1.28* 0.98 0.93  CALCIUM 9.2 8.4* 8.0*   LFT Recent Labs    04/22/19 1438 04/23/19 0808  PROT 6.9 5.7*  ALBUMIN 3.4* 2.9*  AST 13* 17  ALT 12 14  ALKPHOS 90 103  BILITOT 0.5 0.5   Scheduled Meds: . heparin  5,000 Units Subcutaneous Q8H  . senna  2 tablet Oral BID  . sodium chloride flush  3 mL Intravenous Q12H   Continuous Infusions: . lactated ringers 75 mL/hr at  04/24/19 1235   PRN Meds:.acetaminophen **OR** acetaminophen, hydrOXYzine, metoCLOPramide (REGLAN) injection, ondansetron **OR** ondansetron (ZOFRAN) IV   ASSESMENT:   *     Chronic obstipation, constipation, suspicion for Hirschsprung's disease initially raised at age 23 but patient never followed up for confirmatory studies.  *     Tachycardia.   PLAN   *   Outpatient rectal manometry on 05/15/2019 at 19 at [facility].  Dr. Silverio Decamp will arrange outpatient follow-up after that.  *   Needs surgical, full-thickness rectal biopsies by a general surgeon.  Colonoscopy based biopsies would not be adequate.   I do not see any general surgical consultation in his current record.  *   Continue MiraLAX, Dulcolax PR, fleets or other type enemas as an outpatient  *    GI signing off.   Azucena Freed  04/25/2019, 12:14 PM Phone [phone]   Attending physician's note   I have taken an interval history, reviewed the chart and examined the patient. I agree with the Advanced Practitioner's note, impression and recommendations.   Continue bowel regimen  Scheduled for anorectal manometry as outpatient. Further work up based on findings.   We will sign off, available if have any questions  K. Denzil Magnuson , MD [phone]

## 2019-04-25 NOTE — Discharge Summary (Signed)
Name: Thomas Rivas MRN: [id number] DOB: 03/19/1996 23 y.o. PCP: Patient, No Pcp Per  Date of Admission: 04/22/2019  2:36 PM Date of Discharge: 04/25/2019 Attending Physician: No att. providers found  Discharge Diagnosis: 1. Severe constipation 2. Acute Leukocytosis 3. Sinus tachycardia  Discharge Medications: Allergies as of 04/25/2019   No Known Allergies     Medication List    You have not been prescribed any medications.     Disposition and follow-up:   Thomas Rivas [facility] Good [facility].  At the hospital follow up visit please address:  1.  Status of constipation (when was last BM) and abdominal distention. Ensure he is ready for his procedure on 08/10.   2.  Labs / imaging needed at time of follow-up: CBC  3.  Pending labs/ test needing follow-up:  Follow-up Appointments: Follow-up Information    [facility] Follow up on 05/15/2019.   Why: 12:30 PM. Contact information: [id number] [place] [id number] [phone]       [facility] Follow up.   Why: The clinic will call you to schedule a follow up visit. Contact information: [address] [place] [id number] [phone]          Hospital Course by problem list:  1. Severe Constipation, Possibly due to undiagnosed Hirschsprung's Disease.  Patient presented to the ER with significant abdominal distention and a history of no BM for two weeks. He was started on q6h enemas, soap sud or fleet, as well as Miralax. Overall, he only had a total of 3 enemas and 2 very large bowel movements, which relieved a majority of his distention. GI was consulted and agreed with treatment regimen. They recommended he have anorectal manometry first to start diagnosis process for HD. If positive, diagnosis could be confirmed with full thickness rectal  biopsy. Manometry is scheduled for August 10th.   2. Acute Leukocytosis:  No febrile events or peritoneal signs to suggest bowel perforation. Significantly improved after the two bowel movements. See below for lab values.   3. Sinus Tachycardia:  Likely secondary to severity of constipation, with abdominal pain and anxiety as contributory factors. Resolved after bowel movements.     Discharge Vitals:   BP 93/82 (BP Location: Left Arm)   Pulse (!) 101   Temp 97.9 F (36.6 C) (Oral)   Resp 16   Ht 5\' 10"  (1.778 m)   Wt 60 kg   SpO2 96%   BMI 18.98 kg/m   Pertinent Labs, Studies, and Procedures:   CT Abdomen: (04/22/2019) IMPRESSION: 1. Massively distended colon containing an enormous amount of stool. Rectosigmoid and descending colon fecal impaction. No evidence of free intraperitoneal air. 2. The massively distended colon causes mass effect upon the remaining abdominal viscera. 3. No acute abnormalities otherwise involving the abdomen or pelvis.    WBC: 23.7 > 22.7 > 12.3    U/A:  Negative, with the exception of Ketones: 20   Discharge Instructions: Discharge Instructions    Call MD for:  severe uncontrolled pain   Complete by: As directed    Call MD for:  temperature >100.4   Complete by: As directed    Discharge instructions   Complete by: As directed    Thank you for allowing us to take care of your during your hospitalization!   You were admitted for severe constipation due to possible Hirschsprung's Diease. After fleet  and soap sud enemas, you were able to have a couple large bowel movements. Gastroenterology was consulted and they came to see you. Their recommendation is to have an anal manometry, a procedure that measures the pressures in your rectum, which can help officially diagnose this disorder. It is the first step in making sure you get the correct treatment! This procedure is scheduled for May 15, 2019 at [facility].   Before that  procedure, we want to help you get health insurance. I provided you a list of paperwork to bring to your hospital follow up appointment to get the [facility]. If you are interested in reading more about this type of insurance before your appointment with [place], you can always google "[facility]." [facility] will call you to make this follow up visit.  In the meantime, continue doing Fleet Enemas at home as needed to make sure your constipation does not worsen again. If you do feel like your stomach is getting bloated again or you develop a fever, please return to the ER!   Increase activity slowly   Complete by: As directed       Signed:  Dr. Jose Persia Internal Medicine PGY-1  Pager: [phone] 04/26/2019, 5:06 PM

## 2019-04-25 NOTE — Progress Notes (Signed)
DISCHARGE NOTE HOME [facility] to be discharged Home per MD order. Discussed prescriptions and follow up appointments with the patient. Prescriptions given to patient; medication list explained in detail. Patient verbalized understanding.  Skin clean, dry and intact without evidence of skin break down, no evidence of skin tears noted. IV catheter discontinued intact. Site without signs and symptoms of complications. Dressing and pressure applied. Pt denies pain at the site currently. No complaints noted.  Patient free of lines, drains, and wounds.   An After Visit Summary (AVS) was printed and given to the patient. Patient escorted via wheelchair, and discharged home via private auto.  Arlyss Repress, RN

## 2019-04-25 NOTE — Progress Notes (Signed)
   Subjective:   Patient states that he has had two bowel movements overnight, after 1 additional enema.  Feels as though distention has come down quite a bit but would still like to do at least 1 more enema and have another bowel movement.  He has been able to urinate without difficulty.  States bladder pain has resolved.  Denies nausea, vomiting, abdominal pain.  Tolerated clear liquids diet.   Objective:  Vital signs in last 24 hours: Vitals:   04/24/19 2300 04/25/19 0300 04/25/19 0721 04/25/19 1218  BP: 116/73 119/83 (!) 123/92 117/84  Pulse: (!) 105 100 96 80  Resp: 14 14 20 16   Temp: 97.7 F (36.5 C) 97.9 F (36.6 C) 97.9 F (36.6 C) 98.2 F (36.8 C)  TempSrc: Oral Oral Oral Oral  SpO2: 100% 100% 99% 97%  Weight:  60 kg    Height:       Physical Exam  Constitutional: He is oriented to person, place, and time. He appears well-developed and well-nourished. No distress.  HENT:  Head: Normocephalic and atraumatic.  Cardiovascular: Regular rhythm and normal heart sounds. Tachycardia present.  No murmur heard. Pulmonary/Chest: Effort normal and breath sounds normal. No respiratory distress. He has no wheezes. He has no rales.  Abdominal: Soft. Bowel sounds are normal. He exhibits distension (improving). There is no abdominal tenderness. There is no rebound and no guarding.  Neurological: He is alert and oriented to person, place, and time.  Skin: Skin is warm and dry. No rash noted. He is not diaphoretic. No erythema. No pallor.  Psychiatric: He has a normal mood and affect. His behavior is normal.  Nursing note and vitals reviewed.    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 Consulted gen surgery regarding rectal biopsy.  They were unclear  regarding GIs recommendations so they spoke with GI directly.  GI clarified that they would like the full thickness rectal biopsy after manometry has ordered to be done, not to be done before.  Manometry has been scheduled for August 10.  Discharge pending an additional bowel movement and enema.  -Soapsuds enemasq6h  -Senna and Miralax BID -Zofran PRN for nausea  -Hydroxyzine 25mg  q6h PRN for anxiety -Advance diet to soft foods   #Leukocytosis:  CBC today still pending. If WBC is higher than previous values, patient may need additional work up to rule out any infectious causes. However, if it continues to trend downwards, we will just continue to monitor while patient is admitted.   - Follow up CBC results today. Still waiting for labs to be drawn.    #Urinary Retention: Wynell Balloon notes that he has been able to urinate without difficulty.  Bladder scan done last night showed only 18 cc.  -Bladder scan q8h     Dispo: Anticipated discharge pending bowel clean out.    Dr. Jose Persia Internal Medicine PGY-1  Pager: [phone] 04/25/2019, 12:49 PM

## 2019-05-08 ENCOUNTER — Ambulatory Visit: Payer: Self-pay

## 2019-05-08 ENCOUNTER — Encounter: Payer: Self-pay | Admitting: General Practice

## 2019-05-15 ENCOUNTER — Ambulatory Visit (HOSPITAL_COMMUNITY): Admission: RE | Admit: 2019-05-15 | Payer: Self-pay | Source: Home / Self Care | Admitting: Gastroenterology

## 2019-05-15 ENCOUNTER — Encounter (HOSPITAL_COMMUNITY): Admission: RE | Payer: Self-pay | Source: Home / Self Care

## 2019-05-15 ENCOUNTER — Telehealth: Payer: Self-pay

## 2019-05-15 SURGERY — MANOMETRY, ANORECTAL

## 2019-05-15 NOTE — Progress Notes (Signed)
Attempted to call patient regarding AR appointment . A person answered the phone that did not speak [place]. I then attempted to call back with an interpreter. No one answered. The spanish interpreter left a message asking the person to call [phone] because AR for today has to be cancelled due to not being covid testing .Will attempt to call again

## 2019-05-15 NOTE — Telephone Encounter (Signed)
-----   Message from Vena Rua, PA-C sent at 04/24/2019  2:54 PM EDT ----- Regarding: set up manometry [name] Delane Ginger would like this pt set up for out pt rectal manometry.  Indication is constipation, obstipation, possible Hirschsprungs disease.   She asked me to let you know.   Thanks, S

## 2019-05-15 NOTE — Progress Notes (Signed)
Made Dr. Woodward Ku nurse aware that patient did not show up for covid appointment and we were not able to get in touch with him regarding the Anal manometry

## 2019-12-21 HISTORY — PX: COLON SURGERY: SHX602

## 2019-12-25 HISTORY — PX: EXPLORATORY LAPAROTOMY: SUR591

## 2019-12-31 HISTORY — PX: GASTROJEJUNOSTOMY: SHX1697

## 2020-01-17 ENCOUNTER — Ambulatory Visit: Payer: Self-pay | Admitting: Surgery

## 2020-01-17 ENCOUNTER — Ambulatory Visit (INDEPENDENT_AMBULATORY_CARE_PROVIDER_SITE_OTHER): Payer: Self-pay | Admitting: Surgery

## 2020-01-17 ENCOUNTER — Encounter: Payer: Self-pay | Admitting: Surgery

## 2020-01-17 ENCOUNTER — Other Ambulatory Visit: Payer: Self-pay

## 2020-01-17 VITALS — BP 116/75 | HR 120 | Temp 98.0°F | Ht 70.0 in | Wt 122.0 lb

## 2020-01-17 DIAGNOSIS — Z932 Ileostomy status: Secondary | ICD-10-CM

## 2020-01-17 NOTE — Patient Instructions (Signed)
Continue to be sure that you drink plenty of fluids.  May use Ensure via tube to increase calories.  Follow up here in 1 month.   High-Protein and High-Calorie Diet Eating high-protein and high-calorie foods can help you to gain weight, heal after an injury, and recover after an illness or surgery. The specific amount of daily protein and calories you need depends on:  Your body weight.  The reason this diet is recommended for you. What is my plan? Generally, a high-protein, high-calorie diet involves:  Eating 250-500 extra calories each day.  Making sure that you get enough of your daily calories from protein. Ask your health care provider how many of your calories should come from protein. Talk with a health care provider, such as a diet and nutrition specialist (dietitian), about how much protein and how many calories you need each day. Follow the diet as directed by your health care provider. What are tips for following this plan?  Preparing meals  Add whole milk, half-and-half, or heavy cream to cereal, pudding, soup, or hot cocoa.  Add whole milk to instant breakfast drinks.  Add peanut butter to oatmeal or smoothies.  Add powdered milk to baked goods, smoothies, or milkshakes.  Add powdered milk, cream, or butter to mashed potatoes.  Add cheese to cooked vegetables.  Make whole-milk yogurt parfaits. Top them with granola, fruit, or nuts.  Add cottage cheese to your fruit.  Add avocado, cheese, or both to sandwiches or salads.  Add meat, poultry, or seafood to rice, pasta, casseroles, salads, and soups.  Use mayonnaise when making egg salad, chicken salad, or tuna salad.  Use peanut butter as a dip for vegetables or as a topping for pretzels, celery, or crackers.  Add beans to casseroles, dips, and spreads.  Add pureed beans to sauces and soups.  Replace calorie-free drinks with calorie-containing drinks, such as milk and fruit juice.  Replace water with milk  or heavy cream when making foods such as oatmeal, pudding, or cocoa. General instructions  Ask your health care provider if you should take a nutritional supplement.  Try to eat six small meals each day instead of three large meals.  Eat a balanced diet. In each meal, include one food that is high in protein.  Keep nutritious snacks available, such as nuts, trail mixes, dried fruit, and yogurt.  If you have kidney disease or diabetes, talk with your health care provider about how much protein is safe for you. Too much protein may put extra stress on your kidneys.  Drink your calories. Choose high-calorie drinks and have them after your meals. What high-protein foods should I eat?  Vegetables Soybeans. Peas. Grains Quinoa. Bulgur wheat. Meats and other proteins Beef, pork, and poultry. Fish and seafood. Eggs. Tofu. Textured vegetable protein (TVP). Peanut butter. Nuts and seeds. Dried beans. Protein powders. Dairy Whole milk. Whole-milk yogurt. Powdered milk. Cheese. [employer]. Eggnog. Beverages High-protein supplement drinks. Soy milk. Other foods Protein bars. The items listed above may not be a complete list of high-protein foods and beverages. Contact a dietitian for more options. What high-calorie foods should I eat? Fruits Dried fruit. Fruit leather. Canned fruit in syrup. Fruit juice. Avocado. Vegetables Vegetables cooked in oil or butter. Fried potatoes. Grains Pasta. Quick breads. Muffins. Pancakes. Ready-to-eat cereal. Meats and other proteins Peanut butter. Nuts and seeds. Dairy Heavy cream. Whipped cream. Cream cheese. Sour cream. Ice cream. Custard. Pudding. Beverages Meal-replacement beverages. Nutrition shakes. Fruit juice. Sugar-sweetened soft drinks. Seasonings and  condiments Salad dressing. Mayonnaise. Alfredo sauce. Fruit preserves or jelly. Honey. Syrup. Sweets and desserts Cake. Cookies. Pie. Pastries. Candy bars. Chocolate. Fats and oils Butter  or margarine. Oil. Gravy. Other foods Meal-replacement bars. The items listed above may not be a complete list of high-calorie foods and beverages. Contact a dietitian for more options. Summary  A high-protein, high-calorie diet can help you gain weight or heal faster after an injury, illness, or surgery.  To increase your protein and calories, add ingredients such as whole milk, peanut butter, cheese, beans, meat, or seafood to meal items.  To get enough extra calories each day, include high-calorie foods and beverages at each meal.  Adding a high-calorie drink or shake can be an easy way to help you get enough calories each day. Talk with your healthcare provider or dietitian about the best options for you. This information is not intended to replace advice given to you by your health care provider. Make sure you discuss any questions you have with your health care provider. Document Revised: 09/03/2017 Document Reviewed: 08/03/2017 Elsevier Patient Education  2020 [employer].

## 2020-01-18 NOTE — Progress Notes (Signed)
Patient ID: Thomas Rivas, male   DOB: 1996/03/19, 24 y.o.   MRN: [id number]  HPI Bravery Ketcham is a 24 y.o. male charge from the hospital 3 days ago he was Thomas Rivas. have personally reviewed extensive records from there . 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 He subsequently wire after intervention and gastrostomy tube was placed. He was in the hospital for about 3 weeks and now has recovered well. He is wearing and colostomy and has some intermittent incisional pain that is mild in nature intermittent. He is currently using Imodium and lomotil to control his output. No fevers no chills. He is eating well.  HPI  Past Medical History:  Diagnosis Date  . Hirschsprung's disease     Past Surgical History:  Procedure Laterality Date  . COLON SURGERY  12/21/2019   abdominal colectomy w/ileostomy  . EXPLORATORY LAPAROTOMY  12/25/2019  . GASTROJEJUNOSTOMY  12/31/2019    No family history on file.  Social History Social History   Tobacco Use  . Smoking status: Former Smoker    Types: Cigarettes  . Smokeless tobacco: Never Used  Substance Use Topics  . Alcohol use: Not Currently  . Drug use: Yes    Types: Marijuana    No Known Allergies  Current Outpatient Medications  Medication Sig Dispense Refill  . Acetaminophen 325 MG CAPS Take 2 tablets by mouth every 6 (six) hours as needed.    . diltiazem (TIAZAC) 120 MG 24 hr capsule Take 120 mg by mouth daily.    . diphenoxylate-atropine (LOMOTIL) 2.5-0.025 MG tablet Take 2 tablets by mouth 4 (four) times daily.    Marland Kitchen loperamide (IMODIUM) 2 MG capsule Take 4 mg by mouth every 8 (eight) hours.    . methocarbamol (ROBAXIN) 500 MG tablet Take 1,000 mg by mouth 4 (four) times daily.    . metoCLOPramide (REGLAN) 5 MG tablet Take 5 mg by mouth 4  (four) times daily.    Marland Kitchen oxycodone (OXY-IR) 5 MG capsule Take 5 mg by mouth every 6 (six) hours as needed.     No current facility-administered medications for this visit.     Review of Systems Full ROS  was asked and was negative except for the information on the HPI  Physical Exam Blood pressure 116/75, pulse (!) 120, temperature 98 F (36.7 C), height 5\' 10"  (1.778 m), weight 122 lb (55.3 kg), SpO2 95 %. CONSTITUTIONAL: Very thin. No acute distress. EYES: Pupils are equal, round, and reactive to light, Sclera are non-icteric. EARS, NOSE, MOUTH AND THROAT: He is wearing a mask . Hearing is intact to voice. LYMPH NODES:  Lymph nodes in the neck are normal. RESPIRATORY:  Lungs are clear. There is normal respiratory effort, with equal breath sounds bilaterally, and without pathologic use of accessory muscles. CARDIOVASCULAR: Heart is regular without murmurs, gallops, or rubs. GI: The abdomen is soft, nontender, and nondistended. There there is right lower quadrant and ileostomy pink and patent. There is no peritonitis. Midline wound is healing well without infection. GU: Rectal deferred.   MUSCULOSKELETAL: Normal muscle strength and tone. No cyanosis or edema.   SKIN: Turgor is good and there are no pathologic skin lesions or ulcers. NEUROLOGIC: Motor and sensation is grossly normal. Cranial nerves are grossly intact. PSYCH:  Oriented to person, place and time. Affect is normal.  Data Reviewed  I  have personally reviewed the patient's imaging, laboratory findings and medical records.    Assessment/Plan One two 36-year-old male status post total abdominal colectomy with end ileostomy and malnutrition. Continue diet as tolerated. I do not think that he needs tube feeds at this time since he is recovering and tolerating well. No surgical intervention. I will see him back in 1 to 2 months and at that time we will likely need to study his anatomy and decide whether or not he will be a  candidate for reversal. Referral for ostomy nurse done as well as supplies for ostomy bags.   Sterling Big, MD FACS General Surgeon 01/18/2020, 5:34 PM

## 2020-02-14 ENCOUNTER — Encounter: Payer: Self-pay | Admitting: Surgery

## 2020-02-14 ENCOUNTER — Other Ambulatory Visit: Payer: Self-pay

## 2020-02-14 ENCOUNTER — Ambulatory Visit (INDEPENDENT_AMBULATORY_CARE_PROVIDER_SITE_OTHER): Payer: Self-pay | Admitting: Surgery

## 2020-02-14 VITALS — BP 121/86 | HR 102 | Temp 98.6°F | Resp 12 | Wt 117.2 lb

## 2020-02-14 DIAGNOSIS — Z932 Ileostomy status: Secondary | ICD-10-CM

## 2020-02-14 DIAGNOSIS — R1084 Generalized abdominal pain: Secondary | ICD-10-CM

## 2020-02-14 MED ORDER — LOPERAMIDE HCL 2 MG PO CAPS: 4.0000 mg | ORAL_CAPSULE | Freq: Three times a day (TID) | ORAL | 0 refills | Status: DC

## 2020-02-14 MED ORDER — DIPHENOXYLATE-ATROPINE 2.5-0.025 MG PO TABS: 2.0000 | ORAL_TABLET | Freq: Four times a day (QID) | ORAL | 0 refills | Status: DC

## 2020-02-14 NOTE — Patient Instructions (Addendum)
Your CT Abdomen and Pelvis with contrast is scheduled for 02/22/20 at the [facility] [place] 1:45pm. Nothing to eat or drink 4 hours prior. Please pick up your prep kit prior to appointment.   Pick up your medications at your local pharmacy.   Please work on getting a primary care provider. I will also look for a community clinic and contact you with any information  Please see your appointment below.

## 2020-02-14 NOTE — Progress Notes (Signed)
Outpatient Surgical Follow Up  02/14/2020  Thomas Rivas is an 24 y.o. male.   Chief Complaint  Patient presents with  . Follow-up     total colectomy & gastrostomy tube in [place] 12/21/19    HPI: 23-year-old male well-known to me with a prior history of total abdominal colectomy with a rocky complicated postoperative course performed at outside facility now comes in for follow-up.  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  He takes both Lomotil and Imodium.  He has gained weight and is tolerating regular diet.  No major abdominal pain or concerns.  He still has a G-tube wants to have these remove.  He denies any fevers any chills.  Past Medical History:  Diagnosis Date  . Hirschsprung's disease     Past Surgical History:  Procedure Laterality Date  . COLON SURGERY  12/21/2019   abdominal colectomy w/ileostomy  . EXPLORATORY LAPAROTOMY  12/25/2019  . GASTROJEJUNOSTOMY  12/31/2019    History reviewed. No pertinent family history.  Social History:  reports that he has quit smoking. His smoking use included cigarettes. He has never used smokeless tobacco. He reports previous alcohol use. He reports current drug use. Drug: Marijuana.  Allergies: No Known Allergies  Medications reviewed.    ROS Full ROS performed and is otherwise negative other than what is stated in HPI   BP 121/86   Pulse (!) 102   Temp 98.6 F (37 C)   Resp 12   Wt 117 lb 3.2 oz (53.2 kg)   SpO2 99%   BMI 16.82 kg/m   Physical Exam Vitals and nursing note reviewed. Exam conducted with a chaperone present.  Constitutional:      General: He is not in acute distress.    Appearance: Normal appearance. He is normal weight.  Eyes:     General: No scleral icterus.       Left eye: No discharge.  Pulmonary:     Effort: Pulmonary effort is normal. No respiratory distress.     Breath sounds: No stridor.  Abdominal:     General: Abdomen is flat. There is no distension.     Palpations:  There is no mass.     Tenderness: There is no abdominal tenderness. There is no rebound.     Hernia: No hernia is present.     Comments: ET tube in place and well-healed midline laparotomy scar.  No peritonitis.  Ileostomy is working well  Musculoskeletal:        General: No swelling. Normal range of motion.     Cervical back: Normal range of motion and neck supple. No rigidity.  Skin:    General: Skin is warm.     Capillary Refill: Capillary refill takes less than 2 seconds.  Neurological:     General: No focal deficit present.     Mental Status: He is alert and oriented to person, place, and time.  Psychiatric:        Mood and Affect: Mood normal.        Behavior: Behavior normal.        Thought Content: Thought content normal.        Judgment: Judgment normal.       Assessment/Plan: 24 year old male status post total abdominal colectomy at outside facility with an end ileostomy. Discussed with him about refilling of Lomotil and Imodium.  He seems to be doing well.  He does have a G-tube and I would like to have an idea of  his abdominal anatomy and make sure there is no acute intra-abdominal process before removing the G-tube.  We will see him back in about 2 weeks and hopefully remove the tube.  He is on diltiazem and I have encouraged him to find a primary care doctor and we have made all arrangements possible for him to be seen at the free clinic in [place].  He does not have any coverage and it may take a little bit of time.  Greater than 50% of the 25 minutes  visit was spent in counseling/coordination of care   Sterling Big, MD [facility] General Surgeon

## 2020-02-22 ENCOUNTER — Ambulatory Visit
Admission: RE | Admit: 2020-02-22 | Discharge: 2020-02-22 | Disposition: A | Discharge status: Home / Self Care | Payer: Medicaid Other | Source: Ambulatory Visit | Attending: Surgery | Admitting: Surgery

## 2020-02-22 ENCOUNTER — Other Ambulatory Visit: Payer: Self-pay

## 2020-02-22 DIAGNOSIS — R1084 Generalized abdominal pain: Secondary | ICD-10-CM

## 2020-02-22 MED ORDER — IOHEXOL 300 MG/ML  SOLN
75.0000 mL | Freq: Once | INTRAMUSCULAR | Status: AC | PRN
  Administered 2020-02-22: 75 mL via INTRAVENOUS

## 2020-02-23 ENCOUNTER — Telehealth: Payer: Self-pay

## 2020-02-23 NOTE — Telephone Encounter (Signed)
-----   Message from Leafy Ro, MD sent at 02/23/2020  9:47 AM EDT ----- Please let him know CT did not show any surprises. No new areas of concern ----- Message ----- From: Interface, Rad Results In Sent: 02/23/2020   8:08 AM EDT To: Leafy Ro, MD

## 2020-02-23 NOTE — Telephone Encounter (Signed)
Spoke with patient's mother and notified her of recent CT results per Dr.Pabon. She had questions regarding removal of tube patient currently has. I informed her that they have an appointment Wednesday at 9:45a with Dr.Pabon and he will discuss it then. She verbalized understanding and has no further questions.

## 2020-02-28 ENCOUNTER — Ambulatory Visit (INDEPENDENT_AMBULATORY_CARE_PROVIDER_SITE_OTHER): Payer: Self-pay | Admitting: Surgery

## 2020-02-28 ENCOUNTER — Encounter: Payer: Self-pay | Admitting: Surgery

## 2020-02-28 ENCOUNTER — Other Ambulatory Visit: Payer: Self-pay

## 2020-02-28 VITALS — BP 116/80 | HR 94 | Temp 96.8°F | Ht 70.0 in | Wt 118.6 lb

## 2020-02-28 DIAGNOSIS — Z9049 Acquired absence of other specified parts of digestive tract: Secondary | ICD-10-CM

## 2020-02-28 DIAGNOSIS — Z932 Ileostomy status: Secondary | ICD-10-CM

## 2020-02-28 NOTE — Patient Instructions (Signed)
Dr.Pabon removed tube at today's visit.   Dr.Pabon recommends patient to keep the dressing on for 1-2 days and then removed, clean the area and apply a big band-aid to the area.   Patient will follow up with Dr.Anna and then with our office in 1-2 months.

## 2020-03-01 ENCOUNTER — Encounter: Payer: Self-pay | Admitting: Surgery

## 2020-03-01 NOTE — Progress Notes (Signed)
    Outpatient Surgical Follow Up  03/01/2020  Delta Deshmukh Emberton is an 24 y.o. male.   Chief Complaint  Patient presents with  . Follow-up     f/u total colectomy & gastrostomy tube in [place] 12/21/19-discuss CT A&P 02/22/20    HPI: 24 year old male well-known to me with a prior history of total abdominal colectomy with a rocky complicated postoperative course performed at outside facility now comes in for follow-up.  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  He takes both Lomotil and Imodium.  He has gained weight and is tolerating regular diet.  No major abdominal pain or concerns.  He still has a G-tube wants this  removed.  He denies any fevers any chills. T scan personally reviewed showing no evidence of any acute intra-abdominal abnormalities.  Past Medical History:  Diagnosis Date  . Hirschsprung's disease     Past Surgical History:  Procedure Laterality Date  . COLON SURGERY  12/21/2019   abdominal colectomy w/ileostomy  . EXPLORATORY LAPAROTOMY  12/25/2019  . GASTROJEJUNOSTOMY  12/31/2019    History reviewed. No pertinent family history.  Social History:  reports that he has quit smoking. His smoking use included cigarettes. He has never used smokeless tobacco. He reports previous alcohol use. He reports current drug use. Drug: Marijuana.  Allergies: No Known Allergies  Medications reviewed.    ROS Full ROS performed and is otherwise negative other than what is stated in HPI   BP 116/80   Pulse 94   Temp (!) 96.8 F (36 C) (Temporal)   Ht 5\' 10"  (1.778 m)   Wt 118 lb 9.6 oz (53.8 kg)   SpO2 98%   BMI 17.02 kg/m   Physical Exam Vitals and nursing note reviewed. Exam conducted with a chaperone present.  Constitutional:      General: He is not in acute distress.    Appearance: Normal appearance. He is normal weight.  Pulmonary:     Effort: Pulmonary effort is normal. No respiratory distress.     Breath sounds: No stridor.  Abdominal:   General: There is no distension.     Palpations: There is no mass.     Tenderness: There is no abdominal tenderness. There is no guarding.     Hernia: No hernia is present.     Comments: I removed the G-tube without any issues and a dry dressing placed  Musculoskeletal:        General: Normal range of motion.  Skin:    General: Skin is warm and dry.     Capillary Refill: Capillary refill takes less than 2 seconds.  Neurological:     General: No focal deficit present.     Mental Status: He is alert and oriented to person, place, and time.  Psychiatric:        Mood and Affect: Mood normal.        Behavior: Behavior normal.        Thought Content: Thought content normal.        Judgment: Judgment normal.      Assessment/Plan: Doing well. Continue nutritional support at antidiarrheals. See him back in him couple months. No surgical issues at this time. He might be interested in reversal.  Greater than 50% of the 25 minutes  visit was spent in counseling/coordination of care   , MD [facility] General Surgeon

## 2020-03-14 ENCOUNTER — Telehealth: Payer: Self-pay | Admitting: *Deleted

## 2020-03-14 DIAGNOSIS — Z9049 Acquired absence of other specified parts of digestive tract: Secondary | ICD-10-CM

## 2020-03-14 MED ORDER — ONDANSETRON 4 MG PO TBDP: 4.0000 mg | ORAL_TABLET | Freq: Four times a day (QID) | ORAL | 0 refills | Status: DC | PRN

## 2020-03-14 NOTE — Telephone Encounter (Signed)
Patients mother called and wants a nurse to give the patient a call, she didn't really know what is going on she stated that she just wants a nurse to call him and let him explain it.

## 2020-03-14 NOTE — Telephone Encounter (Signed)
Per Dr.Pabon prescribed the patient Zofran 4 mg po every 6 hrs prn. Order was place for xray of the abdomen with two views per Dr.Pabon. Patient made aware of the recommendations and verbalized understanding. Patient was informed that [facility] company is supposed to contact him for his medical supplies. Patient verbalized understanding.

## 2020-03-15 ENCOUNTER — Ambulatory Visit
Admission: RE | Admit: 2020-03-15 | Discharge: 2020-03-15 | Disposition: A | Discharge status: Home / Self Care | Payer: No Typology Code available for payment source | Source: Ambulatory Visit | Attending: Surgery | Admitting: Surgery

## 2020-03-15 ENCOUNTER — Other Ambulatory Visit: Payer: Self-pay

## 2020-03-15 DIAGNOSIS — Z9049 Acquired absence of other specified parts of digestive tract: Secondary | ICD-10-CM

## 2020-03-18 ENCOUNTER — Telehealth: Payer: Self-pay | Admitting: Surgery

## 2020-03-18 NOTE — Telephone Encounter (Signed)
Spoke with the patient and let him know per Dr [name] states that the xray was normal. He reports that he is very nauseated still. He reports that it is hard for him to eat. He states that the nausea medication does help but he still does not feel right. He also reports some reflux that the Tums is not helping. I advised him he may try another over the counter acid reducer like Prilosec. I offered for him to come in Monday to see Dr [name] but he wanted to be seen sooner. The patient states that he does not have a primary care provider yet. I advised him he may try Urgent Care or a walk in clinic to be seen by a clinician, as we are general surgery. He states he will try this first.

## 2020-03-18 NOTE — Telephone Encounter (Signed)
Mom is following up on results of x-ray done of his stomach.  Please call her.  Thank you

## 2020-04-24 ENCOUNTER — Ambulatory Visit (INDEPENDENT_AMBULATORY_CARE_PROVIDER_SITE_OTHER): Payer: Medicaid Other | Admitting: Surgery

## 2020-04-24 ENCOUNTER — Encounter: Payer: Self-pay | Admitting: Surgery

## 2020-04-24 ENCOUNTER — Other Ambulatory Visit: Payer: Self-pay

## 2020-04-24 VITALS — BP 105/66 | HR 107 | Temp 98.6°F | Ht 70.0 in

## 2020-04-24 DIAGNOSIS — Z9049 Acquired absence of other specified parts of digestive tract: Secondary | ICD-10-CM

## 2020-04-24 NOTE — Progress Notes (Signed)
Outpatient Surgical Follow Up  04/24/2020  Asa Baudoin Lavalley is an 24 y.o. male. well-known to me with a prior history of total abdominal colectomy with a rocky complicated postoperative course performed at outside facility now comes in for follow-up. 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 He takes both Lomotil and Imodium. He has gained weight and is tolerating regular diet. No major abdominal pain or concerns. He still has a G-tube wants this  removed. He denies any fevers any chills. T scan personally reviewed showing no evidence of any acute intra-abdominal abnormalities.  Chief Complaint  Patient presents with  . Follow-up    Follow up total colectomy & tube removal wDr. Fredrik Mogel (02/28/20)    HPI: 24 year old male well-known to me with a prior history of total abdominal colectomy with a rocky complicated postoperative course performed at outside facility now comes in for follow-up. 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 He has wean off lomotil and takes loperamide prn. He has gained weight and is tolerating regular diet. No major abdominal pain or concerns. He denies any fevers any chills. I do not have clear evidence that he has Hirschprung's as this was a presumptive diagnosis but never had concrete histopathological findings.  He wishes to have his ileostomy reversed if possible    Past Medical History:  Diagnosis Date  . Hirschsprung's disease     Past Surgical History:  Procedure Laterality Date  . COLON SURGERY  12/21/2019   abdominal colectomy w/ileostomy  . EXPLORATORY LAPAROTOMY  12/25/2019  . GASTROJEJUNOSTOMY  12/31/2019    History reviewed. No pertinent family history.  Social History:  reports that he has quit smoking. His smoking use included cigarettes. He has never used smokeless tobacco. He reports previous alcohol use. He reports current drug use. Drug: Marijuana.  Allergies: No Known Allergies  Medications  reviewed.    ROS Full ROS performed and is otherwise negative other than what is stated in HPI  Physical Exam Constitutional:      General: He is not in acute distress.    Appearance: Normal appearance. He is normal weight.  Pulmonary:     Effort: Pulmonary effort is normal. No respiratory distress.     Breath sounds: No stridor.  Abdominal:     General: There is no distension.     Palpations: There is no mass.     Tenderness: There is no abdominal tenderness. There is no guarding.     Hernia: No hernia is present.     Comments: Midline laparotomy scar, previous gastrostomy site healed. Ileostomy pink and patent Musculoskeletal:        General: Normal range of motion.  Skin:    General: Skin is warm and dry.     Capillary Refill: Capillary refill takes less than 2 seconds.  Neurological:     General: No focal deficit present.     Mental Status: He is alert and oriented to person, place, and time.  Psychiatric:        Mood and Affect: Mood normal.        Behavior: Behavior normal.        Thought Content: Thought content normal.        Judgment: Judgment normal.      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  He does have significant functional GI issues that will need to be evaluated further by GI, I would also like for them to look  at the diagnosis or Hirschprung's.  After that evaluation we may consider ileostomy takedown.  I will order a barium enema in the meantime and she will let GI determine whether or not he needs an upper and lower scopes. We will follow him up after he completes appropriate work-up. Greater than 50% of the 25 minutes  visit was spent in counseling/coordination of care   Sterling Big, MD [facility] General Surgeon

## 2020-04-24 NOTE — Patient Instructions (Signed)
Dr.Pabon advised patient to have a Gastroenterologist Evaluation referral was sent at today's visit. Patient will receive a call once referral is received. Will contact patient with appointment details to have Barium Enema Xray done. Patient will follow up in one month with Dr.Pabon.  Ileostomy Reversal An ileostomy reversal is a procedure that reverses a temporary ileostomy. The small intestine is disconnected from the opening (stoma) in the abdomen. It is then reconnected to the rest of the intestine inside the body. After this procedure, a stoma and ostomy bag are no longer needed, and bowel movements can pass through the intestines and the rectum (bowel). Tell a health care provider about:  Any allergies you have.  All medicines you are taking, including vitamins, herbs, eye drops, creams, and over-the-counter medicines.  Any problems you or family members have had with anesthetic medicines.  Any blood disorders you have.  Any surgeries you have had.  Any medical conditions you have or have had.  Whether you are pregnant or may be pregnant. What are the risks? Generally, this is a safe procedure. However, problems may occur, including:  Infection.  Bleeding.  Allergic reactions to medicines.  Damage to other structures or organs.  Narrowing of the intestine at the place where it was reconnected.  Blockage of the intestine (ileus). What happens before the procedure? Exams and tests  You will have a physical exam, which may include a rectal exam.  You may have tests, such as: ? Blood tests. ? Stool tests. ? X-rays. ? Colonoscopy. Medicines Ask your health care provider about:  Changing or stopping your regular medicines. This is especially important if you are taking diabetes medicines or blood thinners.  Taking medicines such as aspirin and ibuprofen. These medicines can thin your blood. Do not take these medicines unless your health care provider tells you to  take them.  Taking over-the-counter medicines, vitamins, herbs, and supplements. Staying hydrated Follow instructions from your health care provider about hydration, which may include:  Up to 2 hours before the procedure - you may continue to drink clear liquids, such as water, clear fruit juice, black coffee, and plain tea. Eating and drinking Follow instructions from your health care provider about eating and drinking, which may include:  8 hours before the procedure - stop eating heavy meals or foods, such as meat, fried foods, or fatty foods.  6 hours before the procedure - stop eating light meals or foods, such as toast or cereal.  6 hours before the procedure - stop drinking milk or drinks that contain milk.  2 hours before the procedure - stop drinking clear liquids. General instructions  Ask your health care provider: ? How your surgery site will be marked. ? What steps will be taken to help prevent infection. These may include:  Removing hair at the surgery site.  Washing skin with a germ-killing soap.  Taking antibiotic medicine.  Do not use any products that contain nicotine or tobacco for at least 4-6 weeks before the procedure. These products include cigarettes, e-cigarettes, and chewing tobacco. If you need help quitting, ask your health care provider. What happens during the procedure?   An IV will be inserted into one of your veins.  You will be given a medicine to make you fall asleep (general anesthetic). You may also be given a medicine to help you relax (sedative).  One or more incisions will be made around your stoma to separate your small intestine from your skin.  If there is  scar tissue in your abdomen, it will be removed.  The small intestine will be reconnected to the rest of the intestine inside your abdomen.  Your incisions will be closed with stitches (sutures), skin glue, or adhesive strips. The area may be covered with a bandage  (dressing). The procedure may vary among health care providers and hospitals. What happens after the procedure?  You may continue to receive fluids and medicines through an IV.  You will have some pain. Medicine will be available to help you.  Your blood pressure, heart rate, breathing rate, and blood oxygen level will be monitored until you leave the hospital or clinic.  You may not be able to drink fluids normally or eat solid food until at least 24 hours after your procedure. You may be given ice chips to suck on until you are able to drink fluids normally.  You may have to wear compression stockings. These stockings help to prevent blood clots and reduce swelling in your legs. Summary  An ileostomy reversal is a procedure that reverses a temporary ileostomy.  Before the procedure, follow instructions from your health care provider about taking medicines.  Before the procedure, follow instructions from your health care provider about eating and drinking.  Do not use any products that contain nicotine or tobacco for at least 4-6 weeks before the procedure. These products include cigarettes, e-cigarettes, and chewing tobacco. If you need help quitting, ask your health care provider.  After the procedure, you will have some pain. Medicine will be available to help you. This information is not intended to replace advice given to you by your health care provider. Make sure you discuss any questions you have with your health care provider. Document Revised: 05/16/2018 Document Reviewed: 05/17/2018 Elsevier Patient Education  2020 [employer].

## 2020-04-30 ENCOUNTER — Ambulatory Visit: Payer: No Typology Code available for payment source

## 2020-05-08 ENCOUNTER — Ambulatory Visit: Payer: No Typology Code available for payment source

## 2020-05-10 ENCOUNTER — Ambulatory Visit
Admission: RE | Admit: 2020-05-10 | Discharge: 2020-05-10 | Disposition: A | Discharge status: Home / Self Care | Payer: Medicaid Other | Source: Ambulatory Visit | Attending: Surgery | Admitting: Surgery

## 2020-05-10 ENCOUNTER — Other Ambulatory Visit: Payer: Self-pay | Admitting: Surgery

## 2020-05-10 ENCOUNTER — Other Ambulatory Visit: Payer: Self-pay

## 2020-05-10 ENCOUNTER — Telehealth: Payer: Self-pay | Admitting: Emergency Medicine

## 2020-05-10 DIAGNOSIS — Z9049 Acquired absence of other specified parts of digestive tract: Secondary | ICD-10-CM

## 2020-05-10 NOTE — Telephone Encounter (Signed)
-----   Message from Leafy Ro, MD sent at 05/10/2020  1:12 PM EDT ----- Please let him know enema study was nml ----- Message ----- From: Interface, Rad Results In Sent: 05/10/2020  12:55 PM EDT To: Leafy Ro, MD

## 2020-05-10 NOTE — Telephone Encounter (Signed)
Pt made aware of results and reminded of f/u appt with Dr [name]. Voiced understanding and has no further concerns.

## 2020-05-22 ENCOUNTER — Ambulatory Visit: Payer: Self-pay | Admitting: Surgery

## 2020-06-03 ENCOUNTER — Other Ambulatory Visit: Payer: Self-pay

## 2020-06-03 ENCOUNTER — Ambulatory Visit (INDEPENDENT_AMBULATORY_CARE_PROVIDER_SITE_OTHER): Payer: Medicaid Other | Admitting: Surgery

## 2020-06-03 ENCOUNTER — Encounter: Payer: Self-pay | Admitting: Surgery

## 2020-06-03 VITALS — BP 110/70 | HR 82 | Temp 98.2°F | Ht 70.0 in | Wt 135.0 lb

## 2020-06-03 DIAGNOSIS — Z9049 Acquired absence of other specified parts of digestive tract: Secondary | ICD-10-CM

## 2020-06-03 NOTE — Patient Instructions (Signed)
Follow up here after your consultation with the GI doctor to make sure you are ok for surgery.

## 2020-06-03 NOTE — Progress Notes (Signed)
Outpatient Surgical Follow Up  06/03/2020  Thomas Rivas is an 24 y.o. male.   Chief Complaint  Patient presents with  . Follow-up    HPI: Thomas Rivas is an 24 y.o. male well-known to me with a prior history of total abdominal colectomy with a rocky complicated postoperative course performed at outside facility now comes in for follow-up. 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 He has gained weight and is tolerating regular diet. No major abdominal pain or concerns. His G-tube was removed and he continues to gain weight and is in the best shape he has ever been . He denies any fevers any chills. CT scan personally reviewed showing no evidence of any acute intra-abdominal abnormalities. BE showing good rectal stump, no masses.  There was a question of Hirschsprung's disease but w/o definitive pathological evidence to prove this.  Moreover barium enema does not show a stricture or narrowing. Reviewing his records she was supposed to get rectal biopsy at [facility] but apparently surgery was canceled because he ate ice cream.  That has been several years ago.   Past Medical History:  Diagnosis Date  . Hirschsprung's disease     Past Surgical History:  Procedure Laterality Date  . COLON SURGERY  12/21/2019   abdominal colectomy w/ileostomy  . EXPLORATORY LAPAROTOMY  12/25/2019  . GASTROJEJUNOSTOMY  12/31/2019    No family history on file.  Social History:  reports that he has quit smoking. His smoking use included cigarettes. He has never used smokeless tobacco. He reports previous alcohol use. He reports current drug use. Drug: Marijuana.  Allergies: No Known Allergies  Medications reviewed.    ROS Full ROS performed and is otherwise negative other than what is stated in HPI   BP 110/70   Pulse 82   Temp 98.2 F (36.8 C)   Ht 5\' 10"  (1.778 m)   Wt 135 lb (61.2 kg)   SpO2 97%   BMI 19.37 kg/m   Physical Exam Vitals and nursing note  reviewed. Exam conducted with a chaperone present.  Constitutional:      General: He is not in acute distress.    Appearance: Normal appearance. He is normal weight. He is not ill-appearing.  Eyes:     General: No scleral icterus.       Right eye: No discharge.        Left eye: No discharge.  Cardiovascular:     Rate and Rhythm: Normal rate and regular rhythm.     Heart sounds: No murmur heard.   Pulmonary:     Effort: Pulmonary effort is normal.     Breath sounds: No stridor.  Abdominal:     General: Abdomen is flat. There is no distension.     Palpations: Abdomen is soft. There is no mass.     Tenderness: There is no abdominal tenderness. There is no guarding.     Hernia: No hernia is present.     Comments: Laparotomy scar and right ileostomy in place.  No complications.  Musculoskeletal:     Cervical back: Normal range of motion and neck supple.  Neurological:     General: No focal deficit present.     Mental Status: He is alert and oriented to person, place, and time.  Psychiatric:        Mood and Affect: Mood normal.        Behavior: Behavior normal.        Thought Content: Thought content normal.  Judgment: Judgment normal.    Assessment/Plan: 24 year old male with toxic megacolon less likely his Crohn's disease without any evidence of histopathological diagnosis.  Now significant improvement.  Patient is interested in ileostomy takedown.  I will like to ask GI for assistance in order work-up and if they feel that he may need a rectal biopsy or further work-up to exclude Hirschsprung's.  All the imaging studies and barium enema failed to demonstrate evidence of Hirschsprung's disease. I  Do think at minimum he is going to need a flex endoscopy. We will see him after GI completes evaluation. Greater than 50% of the 25 minutes  visit was spent in counseling/coordination of care   Sterling Big, MD [facility] General Surgeon

## 2020-06-26 ENCOUNTER — Ambulatory Visit: Payer: No Typology Code available for payment source | Admitting: Gastroenterology

## 2020-07-03 ENCOUNTER — Ambulatory Visit: Payer: Medicaid Other | Admitting: Surgery

## 2020-07-03 ENCOUNTER — Encounter: Payer: Self-pay | Admitting: Gastroenterology

## 2020-07-03 ENCOUNTER — Ambulatory Visit (INDEPENDENT_AMBULATORY_CARE_PROVIDER_SITE_OTHER): Payer: Medicaid Other | Admitting: Gastroenterology

## 2020-07-03 ENCOUNTER — Other Ambulatory Visit: Payer: Self-pay

## 2020-07-03 VITALS — BP 102/63 | HR 83 | Temp 98.1°F | Ht 70.0 in | Wt 140.2 lb

## 2020-07-03 DIAGNOSIS — Z9049 Acquired absence of other specified parts of digestive tract: Secondary | ICD-10-CM | POA: Diagnosis not present

## 2020-07-03 DIAGNOSIS — Z9889 Other specified postprocedural states: Secondary | ICD-10-CM | POA: Diagnosis not present

## 2020-07-03 NOTE — Progress Notes (Signed)
Wyline Mood MD, MRCP(U.K) [address]  [address]  [place], [place] [address]  Main: [phone]  Fax: [phone]   Gastroenterology Consultation  Referring Provider:     Leafy Ro, MD Primary Care Physician:  Patient, No Pcp Per Primary Gastroenterologist:  Dr. 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  His postoperative course was complicated.  Review of epic and surgery note from March 2021 at [facility] clinic provides the history. At that time in March 2021 he underwent total abdominal colectomy.  Subsequently had ileus and massive gastric distention concern for pneumoperitoneum underwent reexploratory laparotomy, EGD where he is found to have extensively dilated but viable bowel, no evidence of leak, normal EGD, commenced on TPN.  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  Developed high output ileostomy subsequently.  Was on TPN.  Had a G-tube placed.  Subsequent follow-up with abdomen surgery.  The pathology report demonstrated dilated colonic lumen up to 20 cm, extensive ischemic colitis distal rectum with dense submucosal fibrosis ganglion cells are present within all sections of examined bowel from proximal small bowel resection margin to the distal resection margin.  Benign and viable proximal small bowel distal colonic resection margins with identifiable ganglion cells.  Since the surgery he has been doing very well.  No abdominal pain or no issues with his ileostomy bag.  He recollects he has had constipation his entire life.  Had gone 4 weeks without having a bowel movement  I spent over 30 minutes reviewing old notes, pathology  reports and imaging.  Above the summary Past Medical History:  Diagnosis Date  . Hirschsprung's disease     Past Surgical History:  Procedure Laterality Date  . COLON SURGERY  12/21/2019   abdominal colectomy w/ileostomy  . EXPLORATORY LAPAROTOMY  12/25/2019  . GASTROJEJUNOSTOMY  12/31/2019    Prior to Admission medications   Medication Sig Start Date End Date Taking? Authorizing Provider  Ensure (ENSURE) Take 237 mLs by mouth.    [provider]    No family history on file.   Social History   Tobacco Use  . Smoking status: Former Smoker    Types: Cigarettes  . Smokeless tobacco: Never Used  Vaping Use  . Vaping Use: Never used  Substance Use Topics  . Alcohol use: Not Currently  . Drug use: Yes    Types: Marijuana    Allergies as of 07/03/2020  . (No Known Allergies)    Review of Systems:    All systems reviewed and negative except where noted in HPI.   Physical Exam:  There were no vitals taken for this visit. No LMP for male patient. Psych:  Alert and cooperative. Normal mood and affect. General:   Alert,  Well-developed, well-nourished, pleasant and cooperative in NAD Head:  Normocephalic and atraumatic. Eyes:  Sclera clear, no icterus.   Conjunctiva pink. Ears:  Normal auditory acuity. Lungs:  Respirations even and unlabored.  Clear throughout to auscultation.   No wheezes, crackles, or rhonchi. No acute distress. Heart:  Regular rate and rhythm; no murmurs, clicks, rubs, or gallops. Abdomen: Long midline central scar right lower quadrant ileostomy normal bowel  sounds.  No bruits.  Soft, non-tender and non-distended without masses, hepatosplenomegaly or hernias noted.  No guarding or rebound tenderness.    Neurologic:  Alert and oriented x3;  grossly normal neurologically. Psych:  Alert and cooperative. Normal mood and affect.  Imaging Studies: No results found.  Assessment and Plan:   Thomas Rivas is a 24 y.o. 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  He has a longstanding history of constipation and in March 2021 had a very long course at a hospital in [place] where he underwent emergency total colectomy and 2 rounds of exploratory laparotomy ,his pathology report after surgery does not support the same diagnosis of Hirschsprung's disease as ganglion cells were seen all the way up to the resected margins.  Plan 1.  Flexible sigmoidoscopy unsedated to evaluate the rectum 2.  I will obtain anorectal manometry with balloon expulsion test either from [facility] or from [facility] todetermine if it would be safe for [place] to reconnect the ileum  to the rectum.  Often with dyssynergy defecation if the anus is not able to relax appropriately with simultaneous proximal contraction of the rectum, reconnecting the Ileum to the rectum will lead to recurrence of the problem that he has had previously which is severe constipation.  I have discussed alternative options, risks & benefits,  which include, but are not limited to, bleeding, infection, perforation,respiratory complication & drug reaction.  The patient agrees with this plan & written consent will be obtained.     Follow up in 3 months  Dr Wyline Mood MD,MRCP(U.K)

## 2020-07-08 ENCOUNTER — Ambulatory Visit: Payer: Medicaid Other | Admitting: Surgery

## 2020-07-19 ENCOUNTER — Other Ambulatory Visit: Admission: RE | Admit: 2020-07-19 | Payer: Medicaid Other | Source: Ambulatory Visit

## 2020-07-23 ENCOUNTER — Encounter: Admission: RE | Payer: Self-pay | Source: Home / Self Care

## 2020-07-23 ENCOUNTER — Ambulatory Visit: Admission: RE | Admit: 2020-07-23 | Payer: Medicaid Other | Source: Home / Self Care | Admitting: Gastroenterology

## 2020-07-23 SURGERY — SIGMOIDOSCOPY, FLEXIBLE

## 2020-07-28 ENCOUNTER — Inpatient Hospital Stay
Admission: AD | Admit: 2020-07-28 | Discharge: 2020-08-26 | DRG: 335 | Disposition: A | Discharge status: Home / Self Care | Payer: Medicaid Other | Source: Other Acute Inpatient Hospital | Attending: Surgery | Admitting: Surgery

## 2020-07-28 ENCOUNTER — Emergency Department (HOSPITAL_BASED_OUTPATIENT_CLINIC_OR_DEPARTMENT_OTHER): Payer: Medicaid Other

## 2020-07-28 ENCOUNTER — Other Ambulatory Visit: Payer: Self-pay

## 2020-07-28 ENCOUNTER — Encounter: Payer: Self-pay | Admitting: Internal Medicine

## 2020-07-28 ENCOUNTER — Emergency Department (HOSPITAL_BASED_OUTPATIENT_CLINIC_OR_DEPARTMENT_OTHER)
Admission: EM | Admit: 2020-07-28 | Discharge: 2020-07-28 | Payer: Medicaid Other | Attending: Emergency Medicine | Admitting: Emergency Medicine

## 2020-07-28 DIAGNOSIS — K651 Peritoneal abscess: Secondary | ICD-10-CM | POA: Diagnosis not present

## 2020-07-28 DIAGNOSIS — E871 Hypo-osmolality and hyponatremia: Secondary | ICD-10-CM | POA: Diagnosis not present

## 2020-07-28 DIAGNOSIS — R739 Hyperglycemia, unspecified: Secondary | ICD-10-CM | POA: Diagnosis not present

## 2020-07-28 DIAGNOSIS — R188 Other ascites: Secondary | ICD-10-CM

## 2020-07-28 DIAGNOSIS — D638 Anemia in other chronic diseases classified elsewhere: Secondary | ICD-10-CM | POA: Diagnosis present

## 2020-07-28 DIAGNOSIS — Z932 Ileostomy status: Secondary | ICD-10-CM

## 2020-07-28 DIAGNOSIS — K56609 Unspecified intestinal obstruction, unspecified as to partial versus complete obstruction: Secondary | ICD-10-CM | POA: Diagnosis not present

## 2020-07-28 DIAGNOSIS — Z20822 Contact with and (suspected) exposure to covid-19: Secondary | ICD-10-CM | POA: Insufficient documentation

## 2020-07-28 DIAGNOSIS — R109 Unspecified abdominal pain: Secondary | ICD-10-CM | POA: Diagnosis present

## 2020-07-28 DIAGNOSIS — R000 Tachycardia, unspecified: Secondary | ICD-10-CM | POA: Diagnosis present

## 2020-07-28 DIAGNOSIS — K56699 Other intestinal obstruction unspecified as to partial versus complete obstruction: Secondary | ICD-10-CM | POA: Insufficient documentation

## 2020-07-28 DIAGNOSIS — N179 Acute kidney failure, unspecified: Secondary | ICD-10-CM | POA: Diagnosis not present

## 2020-07-28 DIAGNOSIS — E861 Hypovolemia: Secondary | ICD-10-CM | POA: Diagnosis not present

## 2020-07-28 DIAGNOSIS — E781 Pure hyperglyceridemia: Secondary | ICD-10-CM | POA: Diagnosis not present

## 2020-07-28 DIAGNOSIS — K5651 Intestinal adhesions [bands], with partial obstruction: Secondary | ICD-10-CM | POA: Diagnosis present

## 2020-07-28 DIAGNOSIS — Q431 Hirschsprung's disease: Secondary | ICD-10-CM | POA: Diagnosis not present

## 2020-07-28 DIAGNOSIS — K567 Ileus, unspecified: Secondary | ICD-10-CM

## 2020-07-28 DIAGNOSIS — Z716 Tobacco abuse counseling: Secondary | ICD-10-CM | POA: Diagnosis not present

## 2020-07-28 DIAGNOSIS — R1013 Epigastric pain: Secondary | ICD-10-CM

## 2020-07-28 DIAGNOSIS — T8132XA Disruption of internal operation (surgical) wound, not elsewhere classified, initial encounter: Secondary | ICD-10-CM | POA: Diagnosis not present

## 2020-07-28 DIAGNOSIS — E739 Lactose intolerance, unspecified: Secondary | ICD-10-CM | POA: Diagnosis present

## 2020-07-28 DIAGNOSIS — Z87891 Personal history of nicotine dependence: Secondary | ICD-10-CM | POA: Diagnosis not present

## 2020-07-28 DIAGNOSIS — R066 Hiccough: Secondary | ICD-10-CM | POA: Diagnosis not present

## 2020-07-28 DIAGNOSIS — Z9049 Acquired absence of other specified parts of digestive tract: Secondary | ICD-10-CM

## 2020-07-28 DIAGNOSIS — D75839 Thrombocytosis, unspecified: Secondary | ICD-10-CM | POA: Diagnosis not present

## 2020-07-28 DIAGNOSIS — K3184 Gastroparesis: Secondary | ICD-10-CM | POA: Diagnosis not present

## 2020-07-28 DIAGNOSIS — Y838 Other surgical procedures as the cause of abnormal reaction of the patient, or of later complication, without mention of misadventure at the time of the procedure: Secondary | ICD-10-CM | POA: Diagnosis not present

## 2020-07-28 DIAGNOSIS — E43 Unspecified severe protein-calorie malnutrition: Secondary | ICD-10-CM | POA: Diagnosis present

## 2020-07-28 DIAGNOSIS — Z6820 Body mass index (BMI) 20.0-20.9, adult: Secondary | ICD-10-CM

## 2020-07-28 DIAGNOSIS — Z9119 Patient's noncompliance with other medical treatment and regimen: Secondary | ICD-10-CM | POA: Diagnosis not present

## 2020-07-28 DIAGNOSIS — F17210 Nicotine dependence, cigarettes, uncomplicated: Secondary | ICD-10-CM | POA: Diagnosis present

## 2020-07-28 DIAGNOSIS — R112 Nausea with vomiting, unspecified: Secondary | ICD-10-CM | POA: Diagnosis not present

## 2020-07-28 DIAGNOSIS — Z452 Encounter for adjustment and management of vascular access device: Secondary | ICD-10-CM

## 2020-07-28 DIAGNOSIS — K449 Diaphragmatic hernia without obstruction or gangrene: Secondary | ICD-10-CM | POA: Diagnosis present

## 2020-07-28 DIAGNOSIS — K560 Paralytic ileus: Secondary | ICD-10-CM | POA: Diagnosis not present

## 2020-07-28 DIAGNOSIS — E870 Hyperosmolality and hypernatremia: Secondary | ICD-10-CM | POA: Diagnosis not present

## 2020-07-28 DIAGNOSIS — D72829 Elevated white blood cell count, unspecified: Secondary | ICD-10-CM | POA: Diagnosis not present

## 2020-07-28 DIAGNOSIS — K469 Unspecified abdominal hernia without obstruction or gangrene: Secondary | ICD-10-CM | POA: Diagnosis present

## 2020-07-28 DIAGNOSIS — I10 Essential (primary) hypertension: Secondary | ICD-10-CM | POA: Diagnosis present

## 2020-07-28 DIAGNOSIS — Z720 Tobacco use: Secondary | ICD-10-CM | POA: Diagnosis present

## 2020-07-28 DIAGNOSIS — T8131XA Disruption of external operation (surgical) wound, not elsewhere classified, initial encounter: Secondary | ICD-10-CM | POA: Diagnosis not present

## 2020-07-28 DIAGNOSIS — E0781 Sick-euthyroid syndrome: Secondary | ICD-10-CM | POA: Diagnosis present

## 2020-07-28 DIAGNOSIS — Z4659 Encounter for fitting and adjustment of other gastrointestinal appliance and device: Secondary | ICD-10-CM

## 2020-07-28 DIAGNOSIS — K565 Intestinal adhesions [bands], unspecified as to partial versus complete obstruction: Secondary | ICD-10-CM | POA: Diagnosis not present

## 2020-07-28 LAB — CBC WITH DIFFERENTIAL/PLATELET
Abs Immature Granulocytes: 0.06 10*3/uL (ref 0.00–0.07)
Basophils Absolute: 0.1 10*3/uL (ref 0.0–0.1)
Basophils Relative: 0 %
Eosinophils Absolute: 0 10*3/uL (ref 0.0–0.5)
Eosinophils Relative: 0 %
HCT: 47.7 % (ref 39.0–52.0)
Hemoglobin: 14.7 g/dL (ref 13.0–17.0)
Immature Granulocytes: 0 %
Lymphocytes Relative: 5 %
Lymphs Abs: 0.7 10*3/uL (ref 0.7–4.0)
MCH: 22.4 pg — ABNORMAL LOW (ref 26.0–34.0)
MCHC: 30.8 g/dL (ref 30.0–36.0)
MCV: 72.8 fL — ABNORMAL LOW (ref 80.0–100.0)
Monocytes Absolute: 0.9 10*3/uL (ref 0.1–1.0)
Monocytes Relative: 7 %
Neutro Abs: 12.6 10*3/uL — ABNORMAL HIGH (ref 1.7–7.7)
Neutrophils Relative %: 88 %
Platelets: 347 10*3/uL (ref 150–400)
RBC: 6.55 MIL/uL — ABNORMAL HIGH (ref 4.22–5.81)
RDW: 19.7 % — ABNORMAL HIGH (ref 11.5–15.5)
WBC: 14.4 10*3/uL — ABNORMAL HIGH (ref 4.0–10.5)
nRBC: 0 % (ref 0.0–0.2)

## 2020-07-28 LAB — BASIC METABOLIC PANEL
Anion gap: 9 (ref 5–15)
BUN: 13 mg/dL (ref 6–20)
CO2: 26 mmol/L (ref 22–32)
Calcium: 8.8 mg/dL — ABNORMAL LOW (ref 8.9–10.3)
Chloride: 104 mmol/L (ref 98–111)
Creatinine, Ser: 0.88 mg/dL (ref 0.61–1.24)
GFR, Estimated: >60 mL/min (ref >60)
Glucose, Bld: 115 mg/dL — ABNORMAL HIGH (ref 70–99)
Potassium: 3.7 mmol/L (ref 3.5–5.1)
Sodium: 139 mmol/L (ref 135–145)

## 2020-07-28 LAB — COMPREHENSIVE METABOLIC PANEL
ALT: 16 U/L (ref 0–44)
AST: 31 U/L (ref 15–41)
Albumin: 4.9 g/dL (ref 3.5–5.0)
Alkaline Phosphatase: 118 U/L (ref 38–126)
Anion gap: 14 (ref 5–15)
BUN: 13 mg/dL (ref 6–20)
CO2: 22 mmol/L (ref 22–32)
Calcium: 9.9 mg/dL (ref 8.9–10.3)
Chloride: 100 mmol/L (ref 98–111)
Creatinine, Ser: 1.02 mg/dL (ref 0.61–1.24)
GFR, Estimated: >60 mL/min (ref >60)
Glucose, Bld: 158 mg/dL — ABNORMAL HIGH (ref 70–99)
Potassium: 3.9 mmol/L (ref 3.5–5.1)
Sodium: 136 mmol/L (ref 135–145)
Total Bilirubin: 1.1 mg/dL (ref 0.3–1.2)
Total Protein: 8.6 g/dL — ABNORMAL HIGH (ref 6.5–8.1)

## 2020-07-28 LAB — CBC
HCT: 42.3 % (ref 39.0–52.0)
Hemoglobin: 12.9 g/dL — ABNORMAL LOW (ref 13.0–17.0)
MCH: 22.6 pg — ABNORMAL LOW (ref 26.0–34.0)
MCHC: 30.5 g/dL (ref 30.0–36.0)
MCV: 74.2 fL — ABNORMAL LOW (ref 80.0–100.0)
Platelets: 335 10*3/uL (ref 150–400)
RBC: 5.7 MIL/uL (ref 4.22–5.81)
RDW: 19.2 % — ABNORMAL HIGH (ref 11.5–15.5)
WBC: 8.4 10*3/uL (ref 4.0–10.5)
nRBC: 0 % (ref 0.0–0.2)

## 2020-07-28 LAB — PROTIME-INR
INR: 1.1 (ref 0.8–1.2)
Prothrombin Time: 13.5 seconds (ref 11.4–15.2)

## 2020-07-28 LAB — RESPIRATORY PANEL BY RT PCR (FLU A&B, COVID)
Influenza A by PCR: NEGATIVE
Influenza B by PCR: NEGATIVE
SARS Coronavirus 2 by RT PCR: NEGATIVE

## 2020-07-28 LAB — LIPASE, BLOOD: Lipase: 18 U/L (ref 11–51)

## 2020-07-28 LAB — MAGNESIUM: Magnesium: 2 mg/dL (ref 1.7–2.4)

## 2020-07-28 MED ORDER — SODIUM CHLORIDE 0.9 % IV SOLN: INTRAVENOUS | Status: DC

## 2020-07-28 MED ORDER — NICOTINE 14 MG/24HR TD PT24: 14.0000 mg | MEDICATED_PATCH | Freq: Every day | TRANSDERMAL | Status: DC | PRN

## 2020-07-28 MED ORDER — HYDROMORPHONE HCL 1 MG/ML IJ SOLN
0.5000 mg | Freq: Once | INTRAMUSCULAR | Status: AC
  Administered 2020-07-28: 0.5 mg via INTRAVENOUS
  Filled 2020-07-28: qty 1

## 2020-07-28 MED ORDER — ONDANSETRON HCL 4 MG/2ML IJ SOLN
4.0000 mg | Freq: Four times a day (QID) | INTRAMUSCULAR | Status: DC | PRN
  Administered 2020-07-28 – 2020-08-18 (×11): 4 mg via INTRAVENOUS
  Filled 2020-07-28 (×12): qty 2

## 2020-07-28 MED ORDER — ONDANSETRON HCL 4 MG/2ML IJ SOLN: 4.0000 mg | Freq: Once | INTRAMUSCULAR | Status: AC

## 2020-07-28 MED ORDER — ONDANSETRON HCL 4 MG/2ML IJ SOLN
4.0000 mg | Freq: Once | INTRAMUSCULAR | Status: AC
  Administered 2020-07-28: 4 mg via INTRAVENOUS
  Filled 2020-07-28: qty 2

## 2020-07-28 MED ORDER — HYDROMORPHONE HCL 1 MG/ML IJ SOLN
1.0000 mg | Freq: Once | INTRAMUSCULAR | Status: AC
  Administered 2020-07-28: 1 mg via INTRAVENOUS
  Filled 2020-07-28: qty 1

## 2020-07-28 MED ORDER — HYDROMORPHONE HCL 1 MG/ML IJ SOLN
0.5000 mg | INTRAMUSCULAR | Status: DC | PRN
  Administered 2020-07-28 – 2020-07-31 (×18): 0.5 mg via INTRAVENOUS
  Filled 2020-07-28 (×19): qty 0.5

## 2020-07-28 MED ORDER — ONDANSETRON HCL 4 MG/2ML IJ SOLN
INTRAMUSCULAR | Status: AC
  Administered 2020-07-28: 4 mg via INTRAVENOUS
  Filled 2020-07-28: qty 2

## 2020-07-28 MED ORDER — IOHEXOL 300 MG/ML  SOLN
100.0000 mL | Freq: Once | INTRAMUSCULAR | Status: AC | PRN
  Administered 2020-07-28: 100 mL via INTRAVENOUS

## 2020-07-28 NOTE — ED Notes (Signed)
He became somewhat drowsy after receiving the pain med. His SPO2 was observed to drop to ~88%, so on the advise of Dr. Deretha Emory we placed him on O2 at 2 l.p.m.

## 2020-07-28 NOTE — H&P (Signed)
History and Physical    PLEASE NOTE THAT DRAGON DICTATION SOFTWARE WAS USED IN THE CONSTRUCTION OF THIS NOTE.   Thomas Rivas [id number] DOB: 09/03/1996 DOA: 07/28/2020  PCP: Pcp, No Patient coming from: [facility]  I have personally briefly reviewed patient's old medical records in [facility] Link  Chief Complaint: Abdominal pain  HPI: Thomas Rivas is a 24 y.o. male with medical history significant for Hirschsprung disease status post colectomy and ileostomy, who is admitted to [facility] on 07/28/2020 as transfer from med [facility] ED for further evaluation management of small bowel obstruction after presenting from home to the latter facility complaining of abdominal pain.  The patient reports 2 days of intermittent, sharp, nonradiating epigastric discomfort, and notes exacerbation of the associated intensity of discomfort with palpation of the abdomen.  He reports associated intermittent nausea over that timeframe, resulting in one episode of nonbilious, nonbloody emesis that occurred on the evening of 07/27/2020.  He also notes diminished output in the the ileostomy bag, reporting that the existing output has changed very little in terms of volume over the last 24 hours.  In the setting of aforementioned intermittent nausea, the patient conveys diminished p.o. intake over the last 2 days.   Denies any associated dysuria, gross hematuria, or urinary urgency.  No recent trauma.  Denies any associated subjective fever, chills, rigors, or generalized myalgias. Denies any recent headache, neck stiffness, rhinitis, rhinorrhea, sore throat, shortness of breath, cough, or rash.  No recent traveling or known COVID-19 exposures.  Denies any associated chest pain, palpitations, or diaphoresis.  He also denies any recent orthopnea, PND, or peripheral edema.  Not on any blood thinners as an outpatient.  In the context of a reported history  of Hirschsprung disease, it appears that the patient underwent colectomy with ileostomy in March 2021.  He follows with Dr. Sterling Bigiego Pabon of [facility] as an outpatient.     [facility] ED Course:  Vital signs in [facility] ED were notable for the following: Afebrile; heart rate 75-90; blood pressure 140/89; respiratory rate 16, and oxygen saturation 100% on room air.  Labs performed at [facility] ED were notable for the following: CMP notable for the following: Sodium 136, potassium 3.9, bicarbonate 22, BUN 13, creatinine 1.02 relative to most recent prior creatinine data point of 0.82 on 04/25/2019, albumin 4.9, alkaline phosphatase 118, AST 31, ALT 16, total bilirubin 1.1.  Lipase 18.  CBC notable for white blood cell count of 14,400 with 80% neutrophils, hemoglobin 14.7, platelets 347.  Underwent nasopharyngeal COVID-19 PCR at [facility] ED and found to be negative.  CT abdomen/pelvis showed evidence of a distal small bowel obstruction with transition zone in the right upper quadrant.  [facility] emergency department physician discussed the patient's case and imaging with the on-call general surgeon, Dr. Duanne GuessJennifer Cannon, who requested patient be transferred for admission to the hospitalist service at [facility], with plan for Dr. Lady Garyannon to consult. Dr. Lady Garyannon requested to be notified upon the patient's arrival at [facility].   [facility], the following were administered: Dilaudid 0.5 mg IV x2, Dilaudid 1 mg IV x1, Zofran 4 mg IV x3, and continuous normal saline at 100 cc/h was initiated. NGT was not placed in [facility] ED. subsequently, the patient was transferred to [facility] for admission to the hospital service for further evaluation management of presenting small bowel obstruction.  Review of Systems: As per HPI otherwise 10 point review of systems negative.   Past Medical History:  Diagnosis Date  . Hirschsprung's disease      Past Surgical History:  Procedure Laterality Date  . COLON SURGERY  12/21/2019   abdominal colectomy w/ileostomy  . EXPLORATORY LAPAROTOMY  12/25/2019  . GASTROJEJUNOSTOMY  12/31/2019    Social History:  reports that he has been smoking cigarettes. He has never used smokeless tobacco. He reports previous alcohol use. He reports current drug use. Drug: Marijuana.   No Known Allergies  Family history reviewed and not pertinent.    Prior to Admission medications   Medication Sig Start Date End Date Taking? Authorizing Provider  Ensure (ENSURE) Take 237 mLs by mouth.    [provider]     Objective    Physical Exam: Most recent set of vital signs:afebrile; heart rate 75-90; blood pressure 140/89; respiratory rate 16, and oxygen saturation 100% on room air. General: appears to be stated age; alert, oriented Skin: warm, dry, no rash Head:  AT/[place] Mouth:  Oral mucosa membranes appear dry, normal dentition Neck: supple; trachea midline Heart:  RRR; did not appreciate any M/R/G Lungs: CTAB, did not appreciate any wheezes, rales, or rhonchi Abdomen: Hypoactive bowel sounds; soft, ND; mild tenderness over the epigastrium in the absence of any associated guarding, rigidity, or rebound tenderness; ileostomy bag noted to contain a small amount of clear non-bloody appearing fluid. Vascular: 2+ pedal pulses b/l; 2+ radial pulses b/l Extremities: no peripheral edema, no muscle wasting Neuro: strength and sensation intact in upper and lower extremities b/l    Labs on Admission: I have personally reviewed following labs and imaging studies  CBC: Recent Labs  Lab 07/28/20 0821  WBC 14.4*  NEUTROABS 12.6*  HGB 14.7  HCT 47.7  MCV 72.8*  PLT 347   Basic Metabolic Panel: Recent Labs  Lab 07/28/20 0821  NA 136  K 3.9  CL 100  CO2 22  GLUCOSE 158*  BUN 13  CREATININE 1.02  CALCIUM 9.9   GFR: Estimated Creatinine Clearance: 113.7 mL/min (by C-G formula based  on SCr of 1.02 mg/dL). Liver Function Tests: Recent Labs  Lab 07/28/20 0821  AST 31  ALT 16  ALKPHOS 118  BILITOT 1.1  PROT 8.6*  ALBUMIN 4.9   Recent Labs  Lab 07/28/20 0821  LIPASE 18   No results for input(s): AMMONIA in the last 168 hours. Coagulation Profile: No results for input(s): INR, PROTIME in the last 168 hours. Cardiac Enzymes: No results for input(s): CKTOTAL, CKMB, CKMBINDEX, TROPONINI in the last 168 hours. BNP (last 3 results) No results for input(s): PROBNP in the last 8760 hours. HbA1C: No results for input(s): HGBA1C in the last 72 hours. CBG: No results for input(s): GLUCAP in the last 168 hours. Lipid Profile: No results for input(s): CHOL, HDL, LDLCALC, TRIG, CHOLHDL, LDLDIRECT in the last 72 hours. Thyroid Function Tests: No results for input(s): TSH, T4TOTAL, FREET4, T3FREE, THYROIDAB in the last 72 hours. Anemia Panel: No results for input(s): VITAMINB12, FOLATE, FERRITIN, TIBC, IRON, RETICCTPCT in the last 72 hours. Urine analysis:    Component Value Date/Time   COLORURINE YELLOW 04/22/2019 2102   APPEARANCEUR CLEAR 04/22/2019 2102   LABSPEC >1.046 (H) 04/22/2019 2102   PHURINE 5.0 04/22/2019 2102   GLUCOSEU NEGATIVE 04/22/2019 2102   HGBUR NEGATIVE 04/22/2019 2102   BILIRUBINUR NEGATIVE 04/22/2019 2102   KETONESUR 20 (A) 04/22/2019 2102   PROTEINUR NEGATIVE 04/22/2019 2102  NITRITE NEGATIVE 04/22/2019 2102   LEUKOCYTESUR NEGATIVE 04/22/2019 2102    Radiological Exams on Admission: CT Abdomen Pelvis W Contrast  Result Date: 07/28/2020 CLINICAL DATA:  Acute generalized abdominal pain. EXAM: CT ABDOMEN AND PELVIS WITH CONTRAST TECHNIQUE: Multidetector CT imaging of the abdomen and pelvis was performed using the standard protocol following bolus administration of intravenous contrast. CONTRAST:  OMNIPAQUE IOHEXOL 300 MG/ML  SOLN COMPARISON:  Feb 22, 2020. FINDINGS: Lower chest: No acute abnormality. Hepatobiliary: No focal liver  abnormality is seen. No gallstones, gallbladder wall thickening, or biliary dilatation. Pancreas: Unremarkable. No pancreatic ductal dilatation or surrounding inflammatory changes. Spleen: Normal in size without focal abnormality. Adrenals/Urinary Tract: Adrenal glands and kidneys are unremarkable. No hydronephrosis or renal obstruction is noted. Urinary bladder is not well visualized secondary to scatter artifact arising from what appears to be residual contrast in the rectum. Stomach/Bowel: Status post total colectomy with ostomy seen in the right lower quadrant. There is interval development of gastric distention and severe small bowel dilatation most consistent with distal obstruction. Transition zone appears to be in the right upper quadrant best seen on image number 16 of series 6. As noted above, there appears to be a large amount of residual contrast in the residual rectum. Vascular/Lymphatic: No significant vascular findings are present. No enlarged abdominal or pelvic lymph nodes. Reproductive: Prostate is unremarkable. Other: No abdominal wall hernia or abnormality. No abdominopelvic ascites. Musculoskeletal: No acute or significant osseous findings. IMPRESSION: Status post total colectomy with ostomy seen in the right lower quadrant. There is interval development of gastric distention and severe small bowel dilatation most consistent with distal small bowel obstruction. Transition zone appears to be in the right upper quadrant best seen on image number 16 of series 6. Electronically Signed   By: Lupita Raider M.D.   On: 07/28/2020 09:55      Assessment/Plan   Alixander Rallis is a 24 y.o. male with medical history significant for Hirschsprung disease status post colectomy and ileostomy, who is admitted to [facility] on 07/28/2020 as transfer from med [facility] ED for further evaluation management of small bowel obstruction after presenting from home to the  latter facility complaining of abdominal pain.   Principal Problem:   SBO (small bowel obstruction) (HCC) Active Problems:   Tobacco use   Abdominal pain   Nausea & vomiting   Leukocytosis    #) Small bowel obstruction: Diagnosis on the basis 2 days of progressive abdominal discomfort associated with nausea and vomiting, diminished stool output in the ileostomy bag, with CT abdomen/pelvis demonstrating evidence of small bowel obstruction with transition zone in the right upper quadrant in the absence of any associated evidence of abscess or perforation.  In the context of a history of Hirschsprung disease prompting colectomy and ileostomy, suspect mechanical obstruction stemming from postoperative adhesions.  The patient's case and imaging were discussed with the on-call general surgeon, Dr. Duanne Guess, who recommended admission to the hospitalist service with plan for her to consult on patient's arrival at [facility].  Of note, physical exam upon arrival at [facility] is not associated with any overt peritoneal signs.  We will initiate conservative measures and observe for return of normal bowel function, as described below.  The possibility of placing an NG tube was discussed with the patient, who politely refused for now, but with the possibility of reconsideration should his symptoms subsequently worsen.   Plan: Strict NPO.  Continue saline at 100 cc/h.  As needed IV Dilaudid.  As needed IV Zofran.  Consider NG TE placement attached to low intermittent wall suction should patient's symptoms subsequently worsen.  SCDs for DVT prophylaxis in case the patient should ultimately require surgical intervention.  Check INR.  As it has been approximately 12 hours since most recent set of previous labs performed at [facility] ED, will recheck BMP and CBC at this time, with plan to repeat CMP and CBC in the morning.  Check serum magnesium level given recent diminished oral intake as well as single  episode of vomiting on 07/27/2020.  Monitor strict I's and O's.  Formal general surgery consultation. Dr. Lady Gary notified of patient's arrival at [facility].     #) Epigastric abdominal pain: 2 days of intermittent, nonradiating sharp epigastric abdominal pain that appears to be associated with presenting small bowel obstruction, as above described above, in the absence of peritoneal findings on physical exam.  Of note, CT abdomen/pelvis showed no evidence of associated abscess or bowel perforation.  We will also check urinalysis to ensure no concomitant urinary tract infection.  Plan: Work-up and management of presenting small bowel obstruction, as further described above, including strict n.p.o., IV fluids, as needed IV Dilaudid.  General surgery consultation, as above.  Check urinalysis.  Repeat CMP and CBC in the morning.      #) Leukocytosis: CBC performed in [facility] [place] at approximately 8 AM on 07/28/2020 showed white blood cell count of 14,400 with 88% neutrophils.  Suspect that this is reactive in the context of presenting small bowel obstruction, with no radiographic or physical exam evidence to suggest perforation or abscess at this time.  Overall, no evidence of underlying infectious process at this time.  Patient has remained afebrile and does not appear septic.  Plan: Check urinalysis.  Repeat CBC in the morning.  Work-up and management of presenting SBO, as further detailed above.      #) Chronic tobacco abuse: The patient reports that the current smoker, smoking 1 to 2 cigarettes 2-3 times per week, and has been smoking at this point frequency over the last few years.  Plan: Counseled the patient on the importance of complete smoking discontinuation.  I also ordered a as needed nicotine patch for use during this hospitalization.     DVT prophylaxis: SCDs Code Status: Full code Family Communication: none Disposition Plan: Per Rounding Team Consults called: Case and  imaging were discussed with the on-call general surgeon, Dr. Duanne Guess, as further described above. Admission status: Inpatient; MedSurg at [facility].  COVID-19 Vaccination status: Patient conveys that he has not yet received the COVID-19 vaccine.    PLEASE NOTE THAT DRAGON DICTATION SOFTWARE WAS USED IN THE CONSTRUCTION OF THIS NOTE.   Angie Fava DO [facility] Pager [phone] From 12PM- 12AM  Otherwise, please contact night-coverage  [email or web address] Password [facility]  07/28/2020, 8:28 PM

## 2020-07-28 NOTE — ED Notes (Signed)
[employer] responded @ [phone]

## 2020-07-28 NOTE — ED Provider Notes (Addendum)
[facility] EMERGENCY DEPARTMENT Provider Note   CSN: [id number] Arrival date & time: 07/28/20  0258     History Chief Complaint  Patient presents with  . Abdominal Pain    Thomas Rivas is a 24 y.o. male.  Patient with onset of abdominal pain crampy in nature about 8 PM yesterday.  About 2 hours ago things got worse.  Patient is concerned about bowel obstruction.  Patient followed by Dr. Sterling Big at [facility].  In the [place] area.  They have not operated on him but they follow him along with a GI medicine.  Last seen by them in August of this year.  Patient is status post total abdominal colectomy with a rocky complicated postoperative course performed at outside facility now comes for them to follow-up.  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  He had a G-tube in the past but is been removed.  I think the surgery was done for concerns for Hirschsprung's disease.  But pathological specimen showed no definitive evidence of that.  Patient's had nausea but no vomiting.  Patient brought here by EMS.  I think that the original surgeries were done in the [facility] system.  Patient also states that the ileostomy is had no output.  Otherwise patient's past medical history is noncontributory.        Past Medical History:  Diagnosis Date  . Hirschsprung's disease     Patient Active Problem List   Diagnosis Date Noted  . Chronic idiopathic constipation   . 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 Tobacco use 04/22/2019  . Constipation by delayed colonic transit 04/22/2019    Past Surgical History:  Procedure Laterality Date  . COLON SURGERY  12/21/2019   abdominal colectomy w/ileostomy  . EXPLORATORY LAPAROTOMY  12/25/2019  . GASTROJEJUNOSTOMY  12/31/2019       No family history on file.  Social History   Tobacco Use  . Smoking status: Former Smoker    Types: Cigarettes  . Smokeless tobacco: Never Used  Vaping Use  . Vaping Use: Never  used  Substance Use Topics  . Alcohol use: Not Currently  . Drug use: Yes    Types: Marijuana    Home Medications Prior to Admission medications   Medication Sig Start Date End Date Taking? Authorizing Provider  Ensure (ENSURE) Take 237 mLs by mouth.    [provider]    Allergies    Patient has no known allergies.  Review of Systems   Review of Systems  Constitutional: Negative for chills and fever.  HENT: Negative for congestion, rhinorrhea and sore throat.   Eyes: Negative for visual disturbance.  Respiratory: Negative for cough and shortness of breath.   Cardiovascular: Negative for chest pain and leg swelling.  Gastrointestinal: Positive for abdominal pain and nausea. Negative for diarrhea and vomiting.  Genitourinary: Negative for dysuria.  Musculoskeletal: Negative for back pain and neck pain.  Skin: Negative for rash.  Neurological: Negative for dizziness, light-headedness and headaches.  Hematological: Does not bruise/bleed easily.  Psychiatric/Behavioral: Negative for confusion.    Physical Exam Updated Vital Signs BP 129/85   Pulse 75   Temp 97.7 F (36.5 C) (Oral)   Resp 16   Ht 1.778 m (5\' 10" )   Wt 72.6 kg   SpO2 100%   BMI 22.96 kg/m   Physical Exam Vitals and nursing note reviewed.  Constitutional:      General: He is not in acute distress.    Appearance: Normal appearance.  He is well-developed.  HENT:     Head: Normocephalic and atraumatic.  Eyes:     Extraocular Movements: Extraocular movements intact.     Conjunctiva/sclera: Conjunctivae normal.     Pupils: Pupils are equal, round, and reactive to light.  Cardiovascular:     Rate and Rhythm: Normal rate and regular rhythm.     Heart sounds: No murmur heard.   Pulmonary:     Effort: Pulmonary effort is normal. No respiratory distress.     Breath sounds: Normal breath sounds.  Abdominal:     Palpations: Abdomen is soft.     Tenderness: There is no abdominal tenderness.      Comments: Large midline scar well-healed.  Ileostomy good color right side of abdomen.  No output.  Musculoskeletal:        General: No swelling.     Cervical back: Normal range of motion and neck supple.  Skin:    General: Skin is warm and dry.     Capillary Refill: Capillary refill takes less than 2 seconds.  Neurological:     General: No focal deficit present.     Mental Status: He is alert and oriented to person, place, and time.     Cranial Nerves: No cranial nerve deficit.     Sensory: No sensory deficit.     ED Results / Procedures / Treatments   Labs (all labs ordered are listed, but only abnormal results are displayed) Labs Reviewed  CBC WITH DIFFERENTIAL/PLATELET - Abnormal; Notable for the following components:      Result Value   WBC 14.4 (*)    RBC 6.55 (*)    MCV 72.8 (*)    MCH 22.4 (*)    RDW 19.7 (*)    Neutro Abs 12.6 (*)    All other components within normal limits  COMPREHENSIVE METABOLIC PANEL - Abnormal; Notable for the following components:   Glucose, Bld 158 (*)    Total Protein 8.6 (*)    All other components within normal limits  LIPASE, BLOOD    EKG None  Radiology CT Abdomen Pelvis W Contrast  Result Date: 07/28/2020 CLINICAL DATA:  Acute generalized abdominal pain. EXAM: CT ABDOMEN AND PELVIS WITH CONTRAST TECHNIQUE: Multidetector CT imaging of the abdomen and pelvis was performed using the standard protocol following bolus administration of intravenous contrast. CONTRAST:  OMNIPAQUE IOHEXOL 300 MG/ML  SOLN COMPARISON:  Feb 22, 2020. FINDINGS: Lower chest: No acute abnormality. Hepatobiliary: No focal liver abnormality is seen. No gallstones, gallbladder wall thickening, or biliary dilatation. Pancreas: Unremarkable. No pancreatic ductal dilatation or surrounding inflammatory changes. Spleen: Normal in size without focal abnormality. Adrenals/Urinary Tract: Adrenal glands and kidneys are unremarkable. No hydronephrosis or renal obstruction  is noted. Urinary bladder is not well visualized secondary to scatter artifact arising from what appears to be residual contrast in the rectum. Stomach/Bowel: Status post total colectomy with ostomy seen in the right lower quadrant. There is interval development of gastric distention and severe small bowel dilatation most consistent with distal obstruction. Transition zone appears to be in the right upper quadrant best seen on image number 16 of series 6. As noted above, there appears to be a large amount of residual contrast in the residual rectum. Vascular/Lymphatic: No significant vascular findings are present. No enlarged abdominal or pelvic lymph nodes. Reproductive: Prostate is unremarkable. Other: No abdominal wall hernia or abnormality. No abdominopelvic ascites. Musculoskeletal: No acute or significant osseous findings. IMPRESSION: Status post total colectomy with ostomy seen  in the right lower quadrant. There is interval development of gastric distention and severe small bowel dilatation most consistent with distal small bowel obstruction. Transition zone appears to be in the right upper quadrant best seen on image number 16 of series 6. Electronically Signed   By: Lupita Raider M.D.   On: 07/28/2020 09:55    Procedures Procedures (including critical care time)  Medications Ordered in ED Medications  0.9 %  sodium chloride infusion ( Intravenous New Bag/Given 07/28/20 0827)  ondansetron (ZOFRAN) injection 4 mg (4 mg Intravenous Given 07/28/20 0827)  HYDROmorphone (DILAUDID) injection 1 mg (1 mg Intravenous Given 07/28/20 0827)  iohexol (OMNIPAQUE) 300 MG/ML solution 100 mL (100 mLs Intravenous Contrast Given 07/28/20 0914)    ED Course  I have reviewed the triage vital signs and the nursing notes.  Pertinent labs & imaging results that were available during my care of the patient were reviewed by me and considered in my medical decision making (see chart for details).    MDM  Rules/Calculators/A&P                          Agitation concerning for possible small bowel obstruction.  Patient does have a leukocytosis white count 14,000.  Hemoglobin 14.7.  CT scan is consistent with a small bowel obstruction.  With transition zone in the right upper quadrant area.  Discussed with on-call general surgery at [facility] Dr. Maisie Fus.  She wanted me to contact [facility] since patient followed by them.  Contacted Dr. Doylene Canard on-call for them.  She has agreed to see the patient in consultation but as per our normal protocol internal medicine will admit so have contacted the [facility] service at [facility] to receive the admission.  Waiting to hear back from them.  Patient will require transfer to [facility] hospital.  Covid testing is pending.    Final Clinical Impression(s) / ED Diagnoses Final diagnoses:  SBO (small bowel obstruction) (HCC)    Rx / DC Orders ED Discharge Orders    None       Vanetta Mulders, MD 07/28/20 1051  Patient accepted by hospitalist service at [facility].  Accepting physician is Clyde Lundborg.  General surgery Dr. Lady Gary will consult from [facility].       Vanetta Mulders, MD 07/28/20 1112

## 2020-07-28 NOTE — ED Notes (Signed)
[facility] paged at [employer]

## 2020-07-28 NOTE — ED Notes (Signed)
Patient transported to CT 

## 2020-07-28 NOTE — ED Provider Notes (Signed)
Signout from Dr. Jodelle Gross.  24 year old male with colostomy complaining of increased abdominal pain nausea and no ostomy output.  CT evidence of small bowel obstruction.  He is pending transfer to [facility] for admission. Physical Exam  BP 134/89   Pulse 73   Temp 97.7 F (36.5 C) (Oral)   Resp 16   Ht 5\' 10"  (1.778 m)   Wt 72.6 kg   SpO2 100%   BMI 22.96 kg/m   Physical Exam  ED Course/Procedures     Procedures  MDM   5:15 PM-I was informed that the patient has been assigned at [facility] and [facility] is on the way for transport.  Patient is asking for another dose of pain medicine.  EM[facility]ALA reupdated.      [facility], MD 07/29/20 1047

## 2020-07-28 NOTE — ED Triage Notes (Signed)
Pt with colostomy placed in March, no complications, abdominal pain since yesterday, nausea, states colostomy not active.

## 2020-07-28 NOTE — ED Notes (Signed)
He is in no distress and tells [place] he is "much more comfortable". We re-iterate that we are simply awaiting for a bed to become available at [facility].

## 2020-07-28 NOTE — ED Notes (Addendum)
[facility] Hospitalist consult paged at 1038 via Phil @ [facility]

## 2020-07-28 NOTE — ED Notes (Signed)
Pt transferred to [facility] via [facility] with NS in fusing at 155ml/hour

## 2020-07-29 ENCOUNTER — Inpatient Hospital Stay: Payer: Medicaid Other

## 2020-07-29 DIAGNOSIS — K56609 Unspecified intestinal obstruction, unspecified as to partial versus complete obstruction: Secondary | ICD-10-CM | POA: Diagnosis not present

## 2020-07-29 LAB — COMPREHENSIVE METABOLIC PANEL
ALT: 11 U/L (ref 0–44)
AST: 15 U/L (ref 15–41)
Albumin: 3.8 g/dL (ref 3.5–5.0)
Alkaline Phosphatase: 93 U/L (ref 38–126)
Anion gap: 9 (ref 5–15)
BUN: 11 mg/dL (ref 6–20)
CO2: 25 mmol/L (ref 22–32)
Calcium: 8.8 mg/dL — ABNORMAL LOW (ref 8.9–10.3)
Chloride: 104 mmol/L (ref 98–111)
Creatinine, Ser: 0.77 mg/dL (ref 0.61–1.24)
GFR, Estimated: >60 mL/min (ref >60)
Glucose, Bld: 103 mg/dL — ABNORMAL HIGH (ref 70–99)
Potassium: 3.7 mmol/L (ref 3.5–5.1)
Sodium: 138 mmol/L (ref 135–145)
Total Bilirubin: 0.9 mg/dL (ref 0.3–1.2)
Total Protein: 7 g/dL (ref 6.5–8.1)

## 2020-07-29 LAB — CBC
HCT: 42.2 % (ref 39.0–52.0)
Hemoglobin: 13 g/dL (ref 13.0–17.0)
MCH: 22.8 pg — ABNORMAL LOW (ref 26.0–34.0)
MCHC: 30.8 g/dL (ref 30.0–36.0)
MCV: 74 fL — ABNORMAL LOW (ref 80.0–100.0)
Platelets: 337 10*3/uL (ref 150–400)
RBC: 5.7 MIL/uL (ref 4.22–5.81)
RDW: 18.9 % — ABNORMAL HIGH (ref 11.5–15.5)
WBC: 6.5 10*3/uL (ref 4.0–10.5)
nRBC: 0 % (ref 0.0–0.2)

## 2020-07-29 LAB — MAGNESIUM: Magnesium: 2 mg/dL (ref 1.7–2.4)

## 2020-07-29 LAB — HIV ANTIBODY (ROUTINE TESTING W REFLEX): HIV Screen 4th Generation wRfx: NONREACTIVE

## 2020-07-29 MED ORDER — ENOXAPARIN SODIUM 40 MG/0.4ML ~~LOC~~ SOLN
40.0000 mg | SUBCUTANEOUS | Status: DC
  Administered 2020-08-02 – 2020-08-03 (×2): 40 mg via SUBCUTANEOUS
  Filled 2020-07-29 (×3): qty 0.4

## 2020-07-29 NOTE — Progress Notes (Addendum)
[facility]  - [place] [facility]   PATIENT NAME: Thomas Rivas    MR#:  [id number]  DATE OF BIRTH:  06-21-1996  SUBJECTIVE:  complains of generalized abdominal pain. No vomiting since came to the hospital. Does not want NG tube. Girlfriend in the room.  REVIEW OF SYSTEMS:   Review of Systems  Constitutional: Negative for chills, fever and weight loss.  HENT: Negative for ear discharge, ear pain and nosebleeds.   Eyes: Negative for blurred vision, pain and discharge.  Respiratory: Negative for sputum production, shortness of breath, wheezing and stridor.   Cardiovascular: Negative for chest pain, palpitations, orthopnea and PND.  Gastrointestinal: Positive for abdominal pain. Negative for diarrhea, nausea and vomiting.  Genitourinary: Negative for frequency and urgency.  Musculoskeletal: Negative for back pain and joint pain.  Neurological: Negative for sensory change, speech change, focal weakness and weakness.  Psychiatric/Behavioral: Negative for depression and hallucinations. The patient is not nervous/anxious.    Tolerating Diet:nppo Tolerating PT: not needed  DRUG ALLERGIES:  No Known Allergies  VITALS:  Blood pressure (!) 135/91, pulse 84, temperature 98.7 F (37.1 C), temperature source Oral, resp. rate 16, weight 65.6 kg, SpO2 100 %.  PHYSICAL EXAMINATION:   Physical Exam  GENERAL:  24 y.o.-year-old patient lying in the bed with no acute distress. Thin HEENT: Head atraumatic, normocephalic. Oropharynx and nasopharynx clear.  NECK:  Supple, no jugular venous distention. No thyroid enlargement, no tenderness.  LUNGS: Normal breath sounds bilaterally, no wheezing, rales, rhonchi. No use of accessory muscles of respiration.  CARDIOVASCULAR: S1, S2 normal. No murmurs, rubs, or gallops.  ABDOMEN: Soft, nontender, nondistended. No organomegaly or mass. Midline abdominal scar healed well. ileosotmy + EXTREMITIES: No cyanosis, clubbing or edema b/l.     NEUROLOGIC: Cranial nerves II through XII are intact. No focal Motor or sensory deficits b/l.   PSYCHIATRIC:  patient is alert and oriented x 3.  SKIN: No obvious rash, lesion, or ulcer.   LABORATORY PANEL:  CBC Recent Labs  Lab 07/29/20 0556  WBC 6.5  HGB 13.0  HCT 42.2  PLT 337    Chemistries  Recent Labs  Lab 07/29/20 0556  NA 138  K 3.7  CL 104  CO2 25  GLUCOSE 103*  BUN 11  CREATININE 0.77  CALCIUM 8.8*  MG 2.0  AST 15  ALT 11  ALKPHOS 93  BILITOT 0.9   Cardiac Enzymes No results for input(s): TROPONINI in the last 168 hours. RADIOLOGY:  CT Abdomen Pelvis W Contrast  Result Date: 07/28/2020 CLINICAL DATA:  Acute generalized abdominal pain. EXAM: CT ABDOMEN AND PELVIS WITH CONTRAST TECHNIQUE: Multidetector CT imaging of the abdomen and pelvis was performed using the standard protocol following bolus administration of intravenous contrast. CONTRAST:  OMNIPAQUE IOHEXOL 300 MG/ML  SOLN COMPARISON:  Feb 22, 2020. FINDINGS: Lower chest: No acute abnormality. Hepatobiliary: No focal liver abnormality is seen. No gallstones, gallbladder wall thickening, or biliary dilatation. Pancreas: Unremarkable. No pancreatic ductal dilatation or surrounding inflammatory changes. Spleen: Normal in size without focal abnormality. Adrenals/Urinary Tract: Adrenal glands and kidneys are unremarkable. No hydronephrosis or renal obstruction is noted. Urinary bladder is not well visualized secondary to scatter artifact arising from what appears to be residual contrast in the rectum. Stomach/Bowel: Status post total colectomy with ostomy seen in the right lower quadrant. There is interval development of gastric distention and severe small bowel dilatation most consistent with distal obstruction. Transition zone appears to be in the right upper quadrant best  seen on image number 16 of series 6. As noted above, there appears to be a large amount of residual contrast in the residual rectum.  Vascular/Lymphatic: No significant vascular findings are present. No enlarged abdominal or pelvic lymph nodes. Reproductive: Prostate is unremarkable. Other: No abdominal wall hernia or abnormality. No abdominopelvic ascites. Musculoskeletal: No acute or significant osseous findings. IMPRESSION: Status post total colectomy with ostomy seen in the right lower quadrant. There is interval development of gastric distention and severe small bowel dilatation most consistent with distal small bowel obstruction. Transition zone appears to be in the right upper quadrant best seen on image number 16 of series 6. Electronically Signed   By: Lupita Raider M.D.   On: 07/28/2020 09:55   DG ABD ACUTE 2+V W 1V CHEST  Result Date: 07/29/2020 CLINICAL DATA:  Small bowel obstruction. 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 EXAM: DG ABDOMEN ACUTE WITH 1 VIEW CHEST COMPARISON:  Abdominal CT from yesterday FINDINGS: History of colectomy and ileostomy. Marked small bowel dilatation which was also seen on prior. High-density contrast is seen within the patient's mucous fistula. No apparent pneumoperitoneum on the upright view. Low volume but clear lungs. Normal heart size IMPRESSION: High-grade small bowel obstruction. Electronically Signed   By: Marnee Spring M.D.   On: 07/29/2020 10:36   ASSESSMENT AND PLAN:   Thomas Rivas is a 24 y.o. male with medical history significant for Possible Hirschsprung disease (not proven with biopsy) status post colectomy and ileostomy, who is admitted to [facility] on 07/28/2020 as transfer from med [facility] ED for further evaluation management of small bowel obstruction after presenting from home to the latter facility complaining of abdominal pain.  #Small bowel obstruction -patient came in with abdominal pain nausea and vomiting. -He reports not having vomiting since admission. -Complains of abdominal pain -refuses to placed NG tube. -Appreciate  Dr. Elenor Legato surgery input. Continue conservative management. 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  Benign and viable proximal small bowel distal colonic resection margins with identifiable ganglion cells -Patient follows with G.I. Dr. Tobi Bastos.-- He's in the process of getting rectal manometry -CT abdomen shows Status post total colectomy with ostomy seen in the right lower quadrant. There is interval development of gastric distention and severe small bowel dilatation most consistent with distal small bowel obstruction. Transition zone appears to be in the right upper Quadrant  #Leukocytosis appears reactive resolved  #Chronic tobacco abuse. Consult smoking cessation. Continue nicotine patch  Procedures: none Family communication : girlfriend in the room Consults : surgery CODE STATUS: full DVT Prophylaxis : Lovenox  Status is: Inpatient  Remains inpatient appropriate because:IV treatments appropriate due to intensity of illness or inability to take PO   Dispo: The patient is from: Home              Anticipated d/c is to: Home              Anticipated d/c date is: 2 days              Patient currently is not medically stable to d/c. patient admitted with high-grade small bowel obstruction. Following surgery recommendations with conservative management  at present.       TOTAL TIME TAKING CARE OF THIS PATIENT: 25 minutes.  >50% time spent on counselling and coordination of care  Note: This dictation was prepared with Dragon dictation along with smaller phrase technology. Any transcriptional errors that result from this  process are unintentional.  Enedina Finner M.D    [facility]   CC: Primary care physician; Pcp, NoPatient ID: [name], male   DOB: [birth year], 24 y.o.   MRN: [id number]

## 2020-07-29 NOTE — Plan of Care (Signed)
Discussed plan of care, no questions verbalized Problem: Education: Goal: Knowledge of General Education information will improve Description: Including pain rating scale, medication(s)/side effects and non-pharmacologic comfort measures Outcome: Progressing   Problem: Activity: Goal: Risk for activity intolerance will decrease Outcome: Progressing   Patient's colostomy has stool in it - will continue to monitor  Problem: Elimination: Goal: Will not experience complications related to bowel motility Outcome: Progressing  Discussed pain scale and encouraged patient to request when needed Problem: Pain Managment: Goal: General experience of comfort will improve Outcome: Progressing

## 2020-07-29 NOTE — Consult Note (Signed)
Patient ID: Thomas Rivas, male   DOB: Jul 28, 1996, 24 y.o.   MRN: [id number]  HPI Rayquon Uselman is a 24 y.o. male seen in consultation at the request of [name] for small bowel obstruction.  He is well-known to me and had a significant history of a total abdominal colectomy with an end ileostomy [facility] in [place], comes in for abdominal pain that is colicky type pain that started last night.  There is no specific alleviating or aggravating factors.  He also reports nausea decreased output from the end ileostomy. One episode of  emesis.  CT scan personally reviewed showing evidence of dilated stomach and small bowel.  There is no evidence of pneumatosis there is no evidence of free air.  There is evidence of a transition point. As noted the patient had a questionable history of Hirschsprung's disease but without any definitive pathologic diagnosis and more importantly the initial presentation which I do not think that he actually has Hirschsprung's.  He has been seen by GI and currently is in the process of excluding Hirschsprung's.  BC and CMP were completely normal this morning.  He has not had an NG.  INR is normal. Actually has improved significantly over the last 6 months with significant improvement in nutritional status.  He used to have a G-tube and that has been removed and he was tolerating regular diet up to couple of days ago   HPI  Past Medical History:  Diagnosis Date  . Hirschsprung's disease     Past Surgical History:  Procedure Laterality Date  . COLON SURGERY  12/21/2019   abdominal colectomy w/ileostomy  . EXPLORATORY LAPAROTOMY  12/25/2019  . GASTROJEJUNOSTOMY  12/31/2019    History reviewed. No pertinent family history.  Social History Social History   Tobacco Use  . Smoking status: Current Some Day Smoker    Types: Cigarettes  . Smokeless tobacco: Never Used  . Tobacco comment: 1-2 cigarettes 2-3 days per week.  Vaping Use  . Vaping  Use: Never used  Substance Use Topics  . Alcohol use: Not Currently  . Drug use: Yes    Types: Marijuana    No Known Allergies  Current Facility-Administered Medications  Medication Dose Route Frequency Provider Last Rate Last Admin  . 0.9 %  sodium chloride infusion   Intravenous Continuous Howerter, Justin B, DO 100 mL/hr at 07/28/20 2048 New Bag at 07/28/20 2048  . HYDROmorphone (DILAUDID) injection 0.5 mg  0.5 mg Intravenous Q2H PRN Howerter, Justin B, DO   0.5 mg at 07/29/20 3762  . nicotine (NICODERM CQ - dosed in mg/24 hours) patch 14 mg  14 mg Transdermal Daily PRN Howerter, Justin B, DO      . ondansetron (ZOFRAN) injection 4 mg  4 mg Intravenous Q6H PRN Howerter, Justin B, DO   4 mg at 07/29/20 8315     Review of Systems Full ROS  was asked and was negative except for the information on the HPI  Physical Exam Blood pressure (!) 135/91, pulse 84, temperature 98.7 F (37.1 C), temperature source Oral, resp. rate 16, weight 65.6 kg, SpO2 100 %. CONSTITUTIONAL: NAD EYES: Pupils are equal, round, Sclera are non-icteric. EARS, NOSE, MOUTH AND THROAT: Wearing a mask. Hearing is intact to voice. LYMPH NODES:  Lymph nodes in the neck are normal. RESPIRATORY:  Lungs are clear. There is normal respiratory effort, with equal breath sounds bilaterally, and without pathologic use of accessory muscles. CARDIOVASCULAR: Heart is regular without murmurs, gallops,  or rubs. GI: The abdomen is  soft, nontender, and nondistended.  Evidence of an end ileostomy with liquid output. there are no palpable masses. There is no hepatosplenomegaly. There are normal bowel sounds in all quadrants. GU: Rectal deferred.   MUSCULOSKELETAL: Normal muscle strength and tone. No cyanosis or edema.   SKIN: Turgor is good and there are no pathologic skin lesions or ulcers. NEUROLOGIC: Motor and sensation is grossly normal. Cranial nerves are grossly intact. PSYCH:  Oriented to person, place and time. Affect is  normal.  Data Reviewed  I have personally reviewed the patient's imaging, laboratory findings and medical records.    Assessment/Plan 24 year old male with a prior history of total abdominal colectomy and end ileostomy at outside hospital now presents with signs and symptoms consistent with small bowel obstruction.  His ileostomy is working I do think that this is a partial.  I have placed an order for NG tube IV fluids and correction of electrolytes.  We will continue to follow him.  We will perform serial abdominal exams and serial KUBs.  No need emergent surgical intervention at this time. Sterling Big, MD FACS General Surgeon 07/29/2020, 9:48 AM

## 2020-07-30 ENCOUNTER — Inpatient Hospital Stay: Payer: Medicaid Other

## 2020-07-30 DIAGNOSIS — K56609 Unspecified intestinal obstruction, unspecified as to partial versus complete obstruction: Secondary | ICD-10-CM | POA: Diagnosis not present

## 2020-07-30 LAB — CBC
HCT: 44 % (ref 39.0–52.0)
Hemoglobin: 13.7 g/dL (ref 13.0–17.0)
MCH: 23 pg — ABNORMAL LOW (ref 26.0–34.0)
MCHC: 31.1 g/dL (ref 30.0–36.0)
MCV: 73.9 fL — ABNORMAL LOW (ref 80.0–100.0)
Platelets: 299 10*3/uL (ref 150–400)
RBC: 5.95 MIL/uL — ABNORMAL HIGH (ref 4.22–5.81)
RDW: 19.6 % — ABNORMAL HIGH (ref 11.5–15.5)
WBC: 2.4 10*3/uL — ABNORMAL LOW (ref 4.0–10.5)
nRBC: 0 % (ref 0.0–0.2)

## 2020-07-30 LAB — BASIC METABOLIC PANEL
Anion gap: 11 (ref 5–15)
BUN: 11 mg/dL (ref 6–20)
CO2: 28 mmol/L (ref 22–32)
Calcium: 9.3 mg/dL (ref 8.9–10.3)
Chloride: 104 mmol/L (ref 98–111)
Creatinine, Ser: 0.85 mg/dL (ref 0.61–1.24)
GFR, Estimated: >60 mL/min (ref >60)
Glucose, Bld: 114 mg/dL — ABNORMAL HIGH (ref 70–99)
Potassium: 3.7 mmol/L (ref 3.5–5.1)
Sodium: 143 mmol/L (ref 135–145)

## 2020-07-30 LAB — URINALYSIS, ROUTINE W REFLEX MICROSCOPIC
Bacteria, UA: NONE SEEN
Bilirubin Urine: NEGATIVE
Glucose, UA: NEGATIVE mg/dL
Hgb urine dipstick: NEGATIVE
Ketones, ur: 80 mg/dL — AB
Leukocytes,Ua: NEGATIVE
Nitrite: NEGATIVE
Protein, ur: 100 mg/dL — AB
Specific Gravity, Urine: 1.031 — ABNORMAL HIGH (ref 1.005–1.030)
pH: 5 (ref 5.0–8.0)

## 2020-07-30 LAB — MAGNESIUM: Magnesium: 2.1 mg/dL (ref 1.7–2.4)

## 2020-07-30 MED ORDER — METOPROLOL TARTRATE 5 MG/5ML IV SOLN
2.5000 mg | Freq: Once | INTRAVENOUS | Status: AC
  Administered 2020-07-30: 2.5 mg via INTRAVENOUS
  Filled 2020-07-30: qty 5

## 2020-07-30 MED ORDER — LACTATED RINGERS IV BOLUS
500.0000 mL | Freq: Once | INTRAVENOUS | Status: AC
  Administered 2020-07-30: 500 mL via INTRAVENOUS

## 2020-07-30 NOTE — Progress Notes (Signed)
[facility] SURGICAL PROGRESS NOTE (cpt [phone])  Hospital Day(s): 2.   Interval History: Patient seen and examined, no acute events or new complaints overnight. Patient reports he continues to have nausea, abdominal soreness, distension, and frequent hiccupping and burping. He denies fever, chills. Renal function remains normal, sCr - 0.85, UO - 350 ccs + unmeasured. No electrolyte derangements appreciated. Refused NGT placement yesterday. Ileostomy output significantly decreased. .   Review of Systems:  Constitutional: denies fever, chills  HEENT: denies cough or congestion  Respiratory: denies any shortness of breath  Cardiovascular: denies chest pain or palpitations  Gastrointestinal: + Abdominal pain, + nausea, + distension Genitourinary: denies burning with urination or urinary frequency Musculoskeletal: denies pain, decreased motor or sensation  Vital signs in last 24 hours: [min-max] current  Temp:  [98.3 F (36.8 C)-98.9 F (37.2 C)] 98.3 F (36.8 C) (10/26 0408) Pulse Rate:  [71-89] 89 (10/26 0408) Resp:  [16-18] 18 (10/26 0408) BP: (133-147)/(88-96) 147/96 (10/26 0408) SpO2:  [100 %] 100 % (10/26 0408) Weight:  [64 kg-65.5 kg] 64 kg (10/26 0500)     Height: 5\' 10"  (177.8 cm) Weight: 64 kg BMI (Calculated): 20.24   Intake/Output last 2 shifts:  10/25 0701 - 10/26 0700 In: 1406.5 [I.V.:1406.5] Out: 350 [Urine:350]   Physical Exam:  Constitutional: alert, cooperative and no distress  HENT: normocephalic without obvious abnormality, I was able to place NGT and got about 1L of feculent appearing output immediately Eyes: PERRL, EOM's grossly intact and symmetric  Respiratory: breathing non-labored at rest  Cardiovascular: regular rate and sinus rhythm  Gastrointestinal: Soft, markedly distended and tympanic, no rebound/guarding, ileostomy in LLQ with minimal stool in bag Musculoskeletal: no edema or wounds, motor and sensation grossly intact, NT    Labs:   CBC Latest Ref Rng & Units 07/29/2020 07/28/2020 07/28/2020  WBC 4.0 - 10.5 K/uL 6.5 8.4 14.4(H)  Hemoglobin 13.0 - 17.0 g/dL 07/30/2020 12.9(L) 14.7  Hematocrit 39 - 52 % 42.2 42.3 47.7  Platelets 150 - 400 K/uL 337 335 347   CMP Latest Ref Rng & Units 07/30/2020 07/29/2020 07/28/2020  Glucose 70 - 99 mg/dL 07/30/2020) 572(I) 203(T)  BUN 6 - 20 mg/dL 11 11 13   Creatinine 0.61 - 1.24 mg/dL 597(C 1.63  Sodium 135 - 145 mmol/L 143 138 139  Potassium 3.5 - 5.1 mmol/L 3.7 3.7 3.7  Chloride 98 - 111 mmol/L 104 104 104  CO2 22 - 32 mmol/L 28 25 26   Calcium 8.9 - 10.3 mg/dL 9.3 8.45) 3.64)  Total Protein 6.5 - 8.1 g/dL - 7.0 -  Total Bilirubin 0.3 - 1.2 mg/dL - 0.9 -  Alkaline Phos 38 - 126 U/L - 93 -  AST 15 - 41 U/L - 15 -  ALT 0 - 44 U/L - 11 -     Imaging studies:   KUB + CXR (07/30/2020) personally reviewed with persistent bowel distension, and radiologist report reviewed:  IMPRESSION: Persistent high-grade small bowel obstruction.   Assessment/Plan: (ICD-10's: K73.609) 24 y.o. 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 .   - I was able to place NGT at bedside this morning; output was immediately feculent with about 1L out. Will get KUB to confirm although I am certain this is in good position. Continue to  LIS   - NPO + IVF resuscitation   - Monitor abdominal examination; on-going ileostomy function  - Pain control prn (minimize narcotics); antiemetics prn  - Mobilization encouraged  - No surgical intervention  - Further management per primary service   All of the above findings and recommendations were discussed with the patient, patient's family (Girlfriend at bedside), and the medical team, and all of  patient's and family's questions were answered to his expressed satisfaction.  -- Lynden Oxford, PA-C [facility] 07/30/2020, 7:07 AM [phone] M-F: 7am - 4pm

## 2020-07-30 NOTE — Treatment Plan (Signed)
NG tube pulled back 5cm to 65 per Zack S since xray shows coiling.

## 2020-07-30 NOTE — Progress Notes (Signed)
[facility]  - [place] [facility]   PATIENT NAME: Thomas Rivas    MR#:  [id number]  DATE OF BIRTH:  12-Oct-1995  SUBJECTIVE:  complains of generalized abdominal pain. No vomiting since came to the hospital.  Patient is in agreement to get surgery placed NG tube. He feels abdominal pretty tight. His ileostomy output has decreased.  REVIEW OF SYSTEMS:   Review of Systems  Constitutional: Negative for chills, fever and weight loss.  HENT: Negative for ear discharge, ear pain and nosebleeds.   Eyes: Negative for blurred vision, pain and discharge.  Respiratory: Negative for sputum production, shortness of breath, wheezing and stridor.   Cardiovascular: Negative for chest pain, palpitations, orthopnea and PND.  Gastrointestinal: Positive for abdominal pain. Negative for diarrhea, nausea and vomiting.  Genitourinary: Negative for frequency and urgency.  Musculoskeletal: Negative for back pain and joint pain.  Neurological: Negative for sensory change, speech change, focal weakness and weakness.  Psychiatric/Behavioral: Negative for depression and hallucinations. The patient is not nervous/anxious.    Tolerating Diet:npo Tolerating PT: not needed  DRUG ALLERGIES:  No Known Allergies  VITALS:  Blood pressure (!) 144/106, pulse (!) 109, temperature 98.8 F (37.1 C), temperature source Oral, resp. rate 16, height 5\' 10"  (1.778 m), weight 64 kg, SpO2 100 %.  PHYSICAL EXAMINATION:   Physical Exam  GENERAL:  24 y.o.-year-old patient lying in the bed with no acute distress. Thin HEENT: Head atraumatic, normocephalic. Oropharynx and nasopharynx clear.  NECK:  Supple, no jugular venous distention. No thyroid enlargement, no tenderness.  LUNGS: Normal breath sounds bilaterally, no wheezing, rales, rhonchi. No use of accessory muscles of respiration.  CARDIOVASCULAR: S1, S2 normal. No murmurs, rubs, or gallops.  ABDOMEN: Soft, nontender, nondistended. No organomegaly or  mass. Midline abdominal scar healed well. ileosotmy + EXTREMITIES: No cyanosis, clubbing or edema b/l.    NEUROLOGIC: Cranial nerves II through XII are intact. No focal Motor or sensory deficits b/l.   PSYCHIATRIC:  patient is alert and oriented x 3.  SKIN: No obvious rash, lesion, or ulcer.   LABORATORY PANEL:  CBC Recent Labs  Lab 07/29/20 0556  WBC 6.5  HGB 13.0  HCT 42.2  PLT 337    Chemistries  Recent Labs  Lab 07/29/20 0556 07/29/20 0556 07/30/20 0444  NA 138   < > 143  K 3.7   < > 3.7  CL 104   < > 104  CO2 25   < > 28  GLUCOSE 103*   < > 114*  BUN 11   < > 11  CREATININE 0.77   < > 0.85  CALCIUM 8.8*   < > 9.3  MG 2.0   < > 2.1  AST 15  --   --   ALT 11  --   --   ALKPHOS 93  --   --   BILITOT 0.9  --   --    < > = values in this interval not displayed.   Cardiac Enzymes No results for input(s): TROPONINI in the last 168 hours. RADIOLOGY:  DG ABD ACUTE 2+V W 1V CHEST  Result Date: 07/30/2020 CLINICAL DATA:  Small-bowel obstruction EXAM: DG ABDOMEN ACUTE WITH 1 VIEW CHEST COMPARISON:  07/29/2020 FINDINGS: Persistent marked small bowel dilatation throughout the abdomen. No gross free intraperitoneal air. 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 Normal heart size. Clear lungs. IMPRESSION: Persistent high-grade small bowel obstruction. Electronically Signed   By: 07/31/2020 D.O.  On: 07/30/2020 08:31   DG ABD ACUTE 2+V W 1V CHEST  Result Date: 07/29/2020 CLINICAL DATA:  Small bowel obstruction. 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 EXAM: DG ABDOMEN ACUTE WITH 1 VIEW CHEST COMPARISON:  Abdominal CT from yesterday FINDINGS: History of colectomy and ileostomy. Marked small bowel dilatation which was also seen on prior. High-density contrast is seen within the patient's mucous fistula. No apparent pneumoperitoneum on the upright view. Low volume but clear lungs. Normal heart size IMPRESSION: High-grade small bowel obstruction.  Electronically Signed   By: Marnee Spring M.D.   On: 07/29/2020 10:36   ASSESSMENT AND PLAN:   Thomas Rivas is a 24 y.o. 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  #Small bowel obstruction -patient came in with abdominal pain nausea and vomiting. -Complains of abdominal pain with decreased ileostomy output. -10/25--refuses to placed NG tube. -10/26-- NG tube replaced by surgery. Patient now agreeable. -Appreciate Dr. [name] surgery input. Continue conservative management. -Patient recently in March 2021 underwent total colectomy and ileostomy at [facility] in [place] - from old records The pathology report demonstrated dilated colonic lumen up to 20 cm, extensive ischemic colitis distal rectum with dense submucosal fibrosis ganglion cells are present within all sections of examined bowel from proximal small bowel resection margin to the distal resection margin.  Benign and viable proximal small bowel distal colonic resection margins with identifiable ganglion cells -Patient follows with G.I. Dr. Tobi Bastos.-- He's in the process of getting anal/rectal manometry -CT abdomen on admission shows status post total colectomy with ostomy seen in the right lower quadrant. There is interval development of gastric distention and severe small bowel dilatation most consistent with distal small bowel obstruction. Transition zone appears to be in the right upper Quadrant -continue IV fluids  #Leukocytosis appears reactive resolved  #Chronic tobacco abuse. Consult smoking cessation. Continue nicotine patch  Procedures: none Family communication : none  today Consults : surgery CODE STATUS: full DVT Prophylaxis : Lovenox  Status is: Inpatient  Remains inpatient appropriate because:IV treatments appropriate due to intensity of illness or inability to take PO   Dispo: The patient is from: Home              Anticipated d/c is to: Home              Anticipated d/c date is:2-3day              Patient currently is not medically stable to d/c. patient admitted with high-grade small bowel obstruction. Following surgery recommendations with conservative management at present. Patient to get NG placed today and follow clinically with surgery    TOTAL TIME TAKING CARE OF THIS PATIENT: 22 minutes.  >50% time spent on counselling and coordination of care  Note: This dictation was prepared with Dragon dictation along with smaller phrase technology. Any transcriptional errors that result from this process are unintentional.  Enedina Finner M.D    [facility]   CC: Primary care physician; Pcp, NoPatient ID: Thomas Rivas, male   DOB: [birth year], 24 y.o.   MRN: [id number]

## 2020-07-31 ENCOUNTER — Inpatient Hospital Stay: Payer: Self-pay

## 2020-07-31 ENCOUNTER — Inpatient Hospital Stay: Payer: Medicaid Other

## 2020-07-31 DIAGNOSIS — K56609 Unspecified intestinal obstruction, unspecified as to partial versus complete obstruction: Secondary | ICD-10-CM | POA: Diagnosis not present

## 2020-07-31 LAB — COMPREHENSIVE METABOLIC PANEL
ALT: 10 U/L (ref 0–44)
AST: 16 U/L (ref 15–41)
Albumin: 3.6 g/dL (ref 3.5–5.0)
Alkaline Phosphatase: 62 U/L (ref 38–126)
Anion gap: 12 (ref 5–15)
BUN: 17 mg/dL (ref 6–20)
CO2: 27 mmol/L (ref 22–32)
Calcium: 8.8 mg/dL — ABNORMAL LOW (ref 8.9–10.3)
Chloride: 102 mmol/L (ref 98–111)
Creatinine, Ser: 0.89 mg/dL (ref 0.61–1.24)
GFR, Estimated: >60 mL/min (ref >60)
Glucose, Bld: 157 mg/dL — ABNORMAL HIGH (ref 70–99)
Potassium: 3.8 mmol/L (ref 3.5–5.1)
Sodium: 141 mmol/L (ref 135–145)
Total Bilirubin: 0.6 mg/dL (ref 0.3–1.2)
Total Protein: 7.1 g/dL (ref 6.5–8.1)

## 2020-07-31 LAB — CBC
HCT: 43 % (ref 39.0–52.0)
Hemoglobin: 13.4 g/dL (ref 13.0–17.0)
MCH: 23 pg — ABNORMAL LOW (ref 26.0–34.0)
MCHC: 31.2 g/dL (ref 30.0–36.0)
MCV: 73.8 fL — ABNORMAL LOW (ref 80.0–100.0)
Platelets: 334 10*3/uL (ref 150–400)
RBC: 5.83 MIL/uL — ABNORMAL HIGH (ref 4.22–5.81)
RDW: 19.6 % — ABNORMAL HIGH (ref 11.5–15.5)
WBC: 2.6 10*3/uL — ABNORMAL LOW (ref 4.0–10.5)
nRBC: 0 % (ref 0.0–0.2)

## 2020-07-31 LAB — T4, FREE: Free T4: 0.74 ng/dL (ref 0.61–1.12)

## 2020-07-31 LAB — TSH: TSH: 0.101 u[IU]/mL — ABNORMAL LOW (ref 0.350–4.500)

## 2020-07-31 MED ORDER — HYDROMORPHONE HCL 1 MG/ML IJ SOLN
1.0000 mg | INTRAMUSCULAR | Status: DC | PRN
  Administered 2020-07-31 – 2020-08-07 (×53): 1 mg via INTRAVENOUS
  Filled 2020-07-31 (×56): qty 1

## 2020-07-31 NOTE — Progress Notes (Signed)
Initial Nutrition Assessment  DOCUMENTATION CODES:   Not applicable  INTERVENTION:   TPN per pharmacy   Pt at moderate refeed risk; recommend monitor potassium, magnesium and phosphorus labs daily until stable  NUTRITION DIAGNOSIS:   Inadequate oral intake related to acute illness as evidenced by NPO status.  GOAL:   Patient will meet greater than or equal to 90% of their needs  MONITOR:   Labs, Weight trends, Skin, I & O's, Other (Comment) (TPN)  REASON FOR ASSESSMENT:   Consult New TPN/TNA  ASSESSMENT:   24 y.o. 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  Pt with poor appetite and oral intake for 2 days pta r/t abdominal pain and nausea. Pt with h/o colectomy and ileostomy in March requiring multiple surgeries, TPN and G-J tube placement. Pt was apparently receiving J-tube feedings and venting via the G-port. Tube was in place from 3/28-5/26 but apparently pt stopped using the tube at some point in April as he was eating well and gaining weight. Per chart, pt gained ~19lbs from April to current. Pt NPO with NGT in place to LIS with 2.6L output. Plan is for possible surgical repair, PICC line and TPN. PICC line to be placed 10/28; TPN will start at 1800 tomorrow. Pt likely at moderate refeed risk. RD will obtain NFPE and nutrition related history at follow-up.     Medications reviewed and include: lovenox, NaCl @150ml /hr, hydromorphone   Labs reviewed: K 3.8 wnl, Mg 2.1 wnl Wbc- 2.6(L)  NUTRITION - FOCUSED PHYSICAL EXAM: Unable to perform at this time   Diet Order:   Diet Order            Diet NPO time specified  Diet effective now                EDUCATION NEEDS:   No education needs have been identified at this time  Skin:  Skin Assessment: Reviewed RN  Assessment  Last BM:  10/27- 11/27 via ileostomy  Height:   Ht Readings from Last 1 Encounters:  07/29/20 5\' 10"  (1.778 m)    Weight:   Wt Readings from Last 1 Encounters:  07/30/20 64 kg    Ideal Body Weight:  75.45 kg  BMI:  Body mass index is 20.24 kg/m.  Estimated Nutritional Needs:   Kcal:  2100-2400kcal/day  Protein:  110-120g/day  Fluid:  2.0-2.3L/day  MS, RD, LDN Please refer to [facility] for RD and/or RD on-call/weekend/after hours pager

## 2020-07-31 NOTE — Progress Notes (Signed)
[facility] SURGICAL PROGRESS NOTE (cpt [phone])  Hospital Day(s): 3.   Interval History: Patient seen and examined, no acute events or new complaints overnight. Patient reports he feels marginally better but still with abdominal discomfort and distension. He denies fever, chills, nausea, emesis. Renal function remains normal, sCr - 0.89; UO - 1.6L. No electrolyte derangements. KUB this morning pending. NGT with 2.6L out in the last 24 hours. Ileostomy with only 150 ccs out which is much lower than his typical output.   Review of Systems:  Constitutional: denies fever, chills  HEENT: denies cough or congestion  Respiratory: denies any shortness of breath  Cardiovascular: denies chest pain or palpitations  Gastrointestinal: + abdominal pain, + distension, denied N/V, or diarrhea/and bowel function as per interval history Genitourinary: denies burning with urination or urinary frequency  Vital signs in last 24 hours: [min-max] current  Temp:  [98.3 F (36.8 C)-99.5 F (37.5 C)] 99 F (37.2 C) (10/27 0413) Pulse Rate:  [95-137] 132 (10/27 0413) Resp:  [15-20] 18 (10/27 0413) BP: (139-151)/(88-106) 149/96 (10/27 0413) SpO2:  [92 %-100 %] 100 % (10/27 0413)     Height: 5\' 10"  (177.8 cm) Weight: 64 kg BMI (Calculated): 20.24   Intake/Output last 2 shifts:  10/26 0701 - 10/27 0700 In: 0  Out: 4425 [Urine:1675; Emesis/NG output:2600; Stool:150]   Physical Exam:  Constitutional: alert, cooperative and no distress  HENT: normocephalic without obvious abnormality, NGT in place Eyes: PERRL, EOM's grossly intact and symmetric  Respiratory: breathing non-labored at rest  Cardiovascular: regular rate and sinus rhythm  Gastrointestinal: Soft, markedly distended and tympanic, no rebound/guarding, ileostomy in LLQ with minimal stool in bag Musculoskeletal: no edema or wounds, motor and sensation grossly intact, NT    Labs:  CBC Latest Ref Rng & Units 07/31/2020 07/30/2020  07/29/2020  WBC 4.0 - 10.5 K/uL 2.6(L) 2.4(L) 6.5  Hemoglobin 13.0 - 17.0 g/dL 07/31/2020 69.6 78.9  Hematocrit 39 - 52 % 43.0 44.0 42.2  Platelets 150 - 400 K/uL 334 299 337   CMP Latest Ref Rng & Units 07/30/2020 07/29/2020 07/28/2020  Glucose 70 - 99 mg/dL 07/30/2020) 017(P) 102(H)  BUN 6 - 20 mg/dL 11 11 13   Creatinine 0.61 - 1.24 mg/dL 852(D 7.82  Sodium 135 - 145 mmol/L 143 138 139  Potassium 3.5 - 5.1 mmol/L 3.7 3.7 3.7  Chloride 98 - 111 mmol/L 104 104 104  CO2 22 - 32 mmol/L 28 25 26   Calcium 8.9 - 10.3 mg/dL 9.3 4.23) 5.36)  Total Protein 6.5 - 8.1 g/dL - 7.0 -  Total Bilirubin 0.3 - 1.2 mg/dL - 0.9 -  Alkaline Phos 38 - 126 U/L - 93 -  AST 15 - 41 U/L - 15 -  ALT 0 - 44 U/L - 11 -     Imaging studies:   KUB + CXR (07/31/2020) personally reviewed with persistent dilated loops of small bowel in central abdomen, and radiologist report reviewed:  IMPRESSION: Nasogastric tube tip and side port in stomach. Multiple loops of dilated proximal mid small bowel in a pattern felt to be indicative of small bowel obstruction. No evident free air. Lungs clear.   Assessment/Plan: (ICD-10's: K26.609) 24 y.o. 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 .   - Continue NGT decompression; LIS;  monitor and record output  - NPO + IVF resuscitation              - Monitor abdominal examination; on-going ileostomy function             - Pain control prn (minimize narcotics); antiemetics prn             - Mobilization encouraged             - No surgical intervention             - Further management per primary service   All of the above findings and recommendations were discussed with the patient, patient's family (Girlfriend  at bedside), and the medical team, and all of patient's and family's questions were answered to his expressed satisfaction.  -- Lynden Oxford, PA-C [facility] 07/31/2020, 7:04 AM [phone] M-F: 7am - 4pm

## 2020-07-31 NOTE — Progress Notes (Addendum)
   07/30/20 2000  Assess: MEWS Score  Temp 99 F (37.2 C)  BP (!) 151/99  Pulse Rate (!) 125  Resp 16  Level of Consciousness Alert  SpO2 100 %  O2 Device Room Air  Assess: MEWS Score  MEWS Temp 0  MEWS Systolic 0  MEWS Pulse 2  MEWS RR 0  MEWS LOC 0  MEWS Score 2  MEWS Score Color Yellow  Assess: if the MEWS score is Yellow or Red  Were vital signs taken at a resting state? Yes  Focused Assessment No change from prior assessment  Early Detection of Sepsis Score *See Row Information* Low  MEWS guidelines implemented *See Row Information* Yes  Treat  MEWS Interventions Administered prn meds/treatments;Administered scheduled meds/treatments;Escalated (See documentation below)  Pain Scale 0-10  Pain Score 5  Pain Type Acute pain  Pain Location Abdomen  Pain Orientation Right;Left  Pain Descriptors / Indicators Aching  Pain Frequency Constant  Pain Onset On-going  Patients Stated Pain Goal 0  Pain Intervention(s) Repositioned;Rest;Relaxation;Environmental changes;Emotional support   Yellow mews due to increased HR in the 125-130s due to pain. Interventions above to help address pain in pt. Notified charge nurse at 2010, and provider Jon Billings, NP at [phone]. See epic for orders. Will continue to monitor.   0629-Not stabilized, remains on department. HR remains in 125s-130s. NP Jon Billings aware. IV dilaudid given for pain management.

## 2020-07-31 NOTE — Progress Notes (Signed)
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  10/27-Pt with dark high output bilious liquid coming from NGT. Plan picc for 10/28 for TPN    Consultants:   surgery  Procedures: NGT  Antimicrobials:       Subjective: Pt denies nausea. Mild pain earlier. Quite during exam.   Objective: Vitals:   07/30/20 2157 07/30/20 2359 07/31/20 0413 07/31/20 0750  BP: (!) 142/96 (!) 151/101 (!) 149/96 (!) 152/104  Pulse: (!) 137 (!) 136 (!) 132 (!) 142  Resp: 16 18 18 18   Temp: 98.3 F (36.8 C) 99.1 F (37.3 C) 99 F (37.2 C) 99 F (37.2 C)  TempSrc: Oral Oral Oral Oral  SpO2: 92% 100% 100% 94%  Weight:      Height:        Intake/Output Summary (Last 24 hours) at 07/31/2020 0904 Last data filed at 07/31/2020 08/02/2020 Gross per 24 hour  Intake 0 ml  Output 3075 ml  Net -3075 ml   Filed Weights   07/29/20 0538 07/29/20 2041 07/30/20 0500  Weight: 65.6 kg 65.5 kg 64 kg    Examination:  General exam: Appears calm and comfortable  NGT in place Respiratory system: Clear to auscultation. Respiratory effort normal. Cardiovascular system: S1 & S2 heard, RRR. No JVD, murmurs, rubs, gallops or clicks. No pedal edema. Gastrointestinal system: Abdomen is mild distention, decrease bs, nt, soft Central nervous system: Alert and oriented. No focal neurological deficits. Extremities: no edema Skin: warm, dry Psychiatry: Judgement and insight appear normal. Mood & affect appropriate.     Data  Reviewed: I have personally reviewed following labs and imaging studies  CBC: Recent Labs  Lab 07/28/20 0821 07/28/20 2106 07/29/20 0556 07/30/20 2137 07/31/20 0609  WBC 14.4* 8.4 6.5 2.4* 2.6*  NEUTROABS 12.6*  --   --   --   --   HGB 14.7 12.9* 13.0 13.7 13.4  HCT 47.7 42.3 42.2 44.0 43.0  MCV 72.8* 74.2* 74.0* 73.9* 73.8*  PLT 347 335 337 299 334   Basic Metabolic Panel: Recent Labs  Lab 07/28/20 0821 07/28/20 2106 07/29/20 0556 07/30/20 0444 07/31/20 0609  NA 136 139 138 143 141  K 3.9 3.7 3.7 3.7 3.8  CL 100 104 104 104 102  CO2 22 26 25 28 27   GLUCOSE 158* 115* 103* 114* 157*  BUN 13 13 11 11 17   CREATININE 1.02 0.88 0.77 0.85 0.89  CALCIUM 9.9 8.8* 8.8* 9.3 8.8*  MG  --  2.0 2.0 2.1  --    GFR: Estimated Creatinine Clearance: 114.9 mL/min (by C-G formula based on SCr of 0.89 mg/dL). Liver Function Tests: Recent Labs  Lab 07/28/20 0821 07/29/20 0556 07/31/20 0609  AST 31 15 16   ALT 16 11 10   ALKPHOS 118 93 62  BILITOT 1.1 0.9 0.6  PROT 8.6* 7.0 7.1  ALBUMIN 4.9 3.8 3.6   Recent Labs  Lab 07/28/20 0821  LIPASE 18   No results for input(s): AMMONIA in the last 168 hours. Coagulation Profile: Recent Labs  Lab 07/28/20 2106  INR 1.1   Cardiac  Enzymes: No results for input(s): CKTOTAL, CKMB, CKMBINDEX, TROPONINI in the last 168 hours. BNP (last 3 results) No results for input(s): PROBNP in the last 8760 hours. HbA1C: No results for input(s): HGBA1C in the last 72 hours. CBG: No results for input(s): GLUCAP in the last 168 hours. Lipid Profile: No results for input(s): CHOL, HDL, LDLCALC, TRIG, CHOLHDL, LDLDIRECT in the last 72 hours. Thyroid Function Tests: Recent Labs    07/31/20 0609  TSH 0.101*   Anemia Panel: No results for input(s): VITAMINB12, FOLATE, FERRITIN, TIBC, IRON, RETICCTPCT in the last 72 hours. Sepsis Labs: No results for input(s): PROCALCITON, LATICACIDVEN in the last 168 hours.  Recent Results (from the past 240  hour(s))  Respiratory Panel by RT PCR (Flu A&B, Covid) - Nasopharyngeal Swab     Status: None   Collection Time: 07/28/20 10:22 AM   Specimen: Nasopharyngeal Swab  Result Value Ref Range Status   SARS Coronavirus 2 by RT PCR NEGATIVE NEGATIVE Final    Comment: (NOTE) SARS-CoV-2 target nucleic acids are NOT DETECTED.  The SARS-CoV-2 RNA is generally detectable in upper respiratoy specimens during the acute phase of infection. The lowest concentration of SARS-CoV-2 viral copies this assay can detect is 131 copies/mL. A negative result does not preclude SARS-Cov-2 infection and should not be used as the sole basis for treatment or other patient management decisions. A negative result may occur with  improper specimen collection/handling, submission of specimen other than nasopharyngeal swab, presence of viral mutation(s) within the areas targeted by this assay, and inadequate number of viral copies (<131 copies/mL). A negative result must be combined with clinical observations, patient history, and epidemiological information. The expected result is Negative.  Fact Sheet for Patients:  https://www.moore.com/  Fact Sheet for Healthcare Providers:  https://www.young.biz/  This test is no t yet approved or cleared by the [place] FDA and  has been authorized for detection and/or diagnosis of SARS-CoV-2 by FDA under an Emergency Use Authorization (EUA). This EUA will remain  in effect (meaning this test can be used) for the duration of the COVID-19 declaration under Section 564(b)(1) of the Act, 21 U.S.C. section 360bbb-3(b)(1), unless the authorization is terminated or revoked sooner.     Influenza A by PCR NEGATIVE NEGATIVE Final   Influenza B by PCR NEGATIVE NEGATIVE Final    Comment: (NOTE) The Xpert Xpress SARS-CoV-2/FLU/RSV assay is intended as an aid in  the diagnosis of influenza from Nasopharyngeal swab specimens and  should not  be used as a sole basis for treatment. Nasal washings and  aspirates are unacceptable for Xpert Xpress SARS-CoV-2/FLU/RSV  testing.  Fact Sheet for Patients: https://www.moore.com/  Fact Sheet for Healthcare Providers: https://www.young.biz/  This test is not yet approved or cleared by the [place] FDA and  has been authorized for detection and/or diagnosis of SARS-CoV-2 by  FDA under an Emergency Use Authorization (EUA). This EUA will remain  in effect (meaning this test can be used) for the duration of the  Covid-19 declaration under Section 564(b)(1) of the Act, 21  U.S.C. section 360bbb-3(b)(1), unless the authorization is  terminated or revoked. Performed at [facility], [address], [place], [place] [address]          Radiology Studies: DG Chest [place] 1 View  Result Date: 07/30/2020 CLINICAL DATA:  Tachycardia EXAM: PORTABLE CHEST 1 VIEW COMPARISON:  CT 07/28/2020, abdominal radiograph 07/30/2020 FINDINGS: Transesophageal tube tip coiling in the left upper quadrant with the side port beyond  the GE junction. No consolidation, features of edema, pneumothorax, or effusion. The cardiomediastinal contours are unremarkable. Multiple air distended loops of bowel seen in the upper abdomen compatible with a previously demonstrated bowel obstruction. No other acute or suspicious osseous or soft tissue abnormality. IMPRESSION: 1. Transesophageal tube tip coiling in the left upper quadrant with the side port beyond the GE junction. 2. Multiple air distended loops of bowel in the upper abdomen compatible with a previously demonstrated bowel obstruction. 3. No acute cardiopulmonary abnormality. Electronically Signed   By: Kreg ShropshirePrice  DeHay M.D.   On: 07/30/2020 20:50   DG ABD ACUTE 2+V W 1V CHEST  Result Date: 07/31/2020 CLINICAL DATA:  Small bowel obstruction. EXAM: DG ABDOMEN ACUTE WITH 1 VIEW CHEST COMPARISON:  Chest radiograph and  abdomen radiograph July 30, 2020 FINDINGS: AP chest: Lungs are clear. Heart size and pulmonary vascularity are normal. No adenopathy. Nasogastric tube tip and side port in stomach. Supine and upright abdomen: Nasogastric tube tip and side port in stomach. There are multiple loops of dilated small bowel in a pattern indicative of a degree of bowel obstruction. No free air appreciable. Contrast seen in the rectum. IMPRESSION: Nasogastric tube tip and side port in stomach. Multiple loops of dilated proximal mid small bowel in a pattern felt to be indicative of small bowel obstruction. No evident free air. Lungs clear. Electronically Signed   By: Bretta BangWilliam  Woodruff III M.D.   On: 07/31/2020 08:31   DG ABD ACUTE 2+V W 1V CHEST  Result Date: 07/30/2020 CLINICAL DATA:  Small-bowel obstruction EXAM: DG ABDOMEN ACUTE WITH 1 VIEW CHEST COMPARISON:  07/29/2020 FINDINGS: Persistent marked small bowel dilatation throughout the abdomen. No gross free intraperitoneal air. 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 Normal heart size. Clear lungs. IMPRESSION: Persistent high-grade small bowel obstruction. Electronically Signed   By: Duanne GuessNicholas  Plundo D.O.   On: 07/30/2020 08:31   DG ABD ACUTE 2+V W 1V CHEST  Result Date: 07/29/2020 CLINICAL DATA:  Small bowel obstruction. 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 EXAM: DG ABDOMEN ACUTE WITH 1 VIEW CHEST COMPARISON:  Abdominal CT from yesterday FINDINGS: History of colectomy and ileostomy. Marked small bowel dilatation which was also seen on prior. High-density contrast is seen within the patient's mucous fistula. No apparent pneumoperitoneum on the upright view. Low volume but clear lungs. Normal heart size IMPRESSION: High-grade small bowel obstruction. Electronically Signed   By: Marnee SpringJonathon  Watts M.D.   On: 07/29/2020 10:36   DG Abd Portable 1V  Result Date: 07/30/2020 CLINICAL DATA:  NG tube placement EXAM: PORTABLE ABDOMEN - 1 VIEW COMPARISON:   07/30/2020 FINDINGS: NG tube coiled in the stomach with the tip near the GE junction. Dilated large and small bowel loops unchanged from the prior study. IMPRESSION: NG tube coiled in the stomach with the tip in the gastric fundus Dilated large and small bowel loops unchanged. Electronically Signed   By: Marlan Palauharles  Clark M.D.   On: 07/30/2020 11:54        Scheduled Meds: . enoxaparin (LOVENOX) injection  40 mg Subcutaneous Q24H   Continuous Infusions: . 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  #Small bowel obstruction -patient came in with abdominal pain nausea and vomiting. -Complains of abdominal pain with decreased ileostomy output. -10/26-- NG tube replaced by surgery. Patient now agreeable. Patient follows with Dr. Jamal Maes is in the process of getting anal/rectal manometry 10/27- high oupt from NGT abd xr with SBO Surgery following.  Per surgery his current condition may likely not resolve, patient wants to avoid surgical intervention if possible Plan to start TPN after PICC placement Abdominal x-ray in a.m. Increase IV fluids 250 mils per hour as patient appears to be more on the dry side sinus tachycardic Consult RD for TPN  #Leukocytosis-resolved.  WBC 2.6 Afebrile  #Chronic tobacco abuse.  Continue nicotine patch     DVT prophylaxis: Lovenox Code Status: Full Family  Communication: Mom at bedside  Status is: Inpatient  Remains inpatient appropriate because:Inpatient level of care appropriate due to severity of illness   Dispo: The patient is from: Home              Anticipated d/c is to: Home              Anticipated d/c date is: > 3 days              Patient currently is not medically stable to d/c.            LOS: 3 days   Time spent: 35 min with >50% on coc    Lynn Ito, MD [facility] Pager [phone]  If 7PM-7AM, please contact night-coverage [email or web address] Password [email or web address] 07/31/2020, 9:04 AM

## 2020-07-31 NOTE — Consult Note (Incomplete)
PHARMACY - TOTAL PARENTERAL NUTRITION CONSULT NOTE   Indication: {TPN Indication:23381}  Patient Measurements: Height: 5\' 10"  (177.8 cm) Weight: 64 kg (141 lb 1.5 oz) IBW/kg (Calculated) : 73 TPN AdjBW (KG): 65.5 Body mass index is 20.24 kg/m. Usual Weight: ***  Assessment:   Glucose / Insulin:  Electrolytes:  Renal:  LFTs / TGs:  Prealbumin / albumin:  Intake / Output; MIVF:  GI Imaging: Surgeries / Procedures:   Central access:  TPN start date:   Nutritional Goals (per RD recommendation on ***): kCal: ***, Protein: ***, Fluid: *** Goal TPN rate is *** mL/hr (provides *** g of protein and *** kcals per day)  Current Nutrition:  {Current Nutrition:23378}  Plan:  Start TPN at ***mL/hr at 1800 Electrolytes in TPN: 73mEq/L of Na, 109mEq/L of K, 75mEq/L of Ca, 22mEq/L of Mg, and 27mmol/L of Phos. Cl:Ac ratio 1:1 Add standard MVI and trace elements to TPN Initiate {SSI - Scale:23379} {SSI - Frequency:23380} SSI and adjust as needed  Reduce MIVF to *** mL/hr at 1800 Monitor TPN labs on Mon/Thurs, ***  Thomas Rivas 07/31/2020,2:31 PM

## 2020-07-31 NOTE — Progress Notes (Signed)
Secure chat message to Dr. Marylu Lund that patients heart rate is sustained in 150's. No new orders.

## 2020-07-31 NOTE — Progress Notes (Addendum)
Spoke with patient's nurse regarding PICC placement. Per RN PICC requested for TPN. No order noted at this time. Patient agreeable to PICC.Plan to place 08/01/20.

## 2020-08-01 ENCOUNTER — Inpatient Hospital Stay: Payer: Medicaid Other

## 2020-08-01 ENCOUNTER — Inpatient Hospital Stay
Admission: AD | Admit: 2020-08-01 | Payer: Medicaid Other | Source: Other Acute Inpatient Hospital | Admitting: Internal Medicine

## 2020-08-01 DIAGNOSIS — K56609 Unspecified intestinal obstruction, unspecified as to partial versus complete obstruction: Secondary | ICD-10-CM | POA: Diagnosis not present

## 2020-08-01 LAB — BASIC METABOLIC PANEL
Anion gap: 12 (ref 5–15)
BUN: 26 mg/dL — ABNORMAL HIGH (ref 6–20)
CO2: 29 mmol/L (ref 22–32)
Calcium: 8.6 mg/dL — ABNORMAL LOW (ref 8.9–10.3)
Chloride: 102 mmol/L (ref 98–111)
Creatinine, Ser: 0.76 mg/dL (ref 0.61–1.24)
GFR, Estimated: >60 mL/min (ref >60)
Glucose, Bld: 140 mg/dL — ABNORMAL HIGH (ref 70–99)
Potassium: 3.9 mmol/L (ref 3.5–5.1)
Sodium: 143 mmol/L (ref 135–145)

## 2020-08-01 LAB — HEPATIC FUNCTION PANEL
ALT: 11 U/L (ref 0–44)
AST: 16 U/L (ref 15–41)
Albumin: 3.3 g/dL — ABNORMAL LOW (ref 3.5–5.0)
Alkaline Phosphatase: 52 U/L (ref 38–126)
Bilirubin, Direct: <0.1 mg/dL (ref 0.0–0.2)
Total Bilirubin: 0.5 mg/dL (ref 0.3–1.2)
Total Protein: 7 g/dL (ref 6.5–8.1)

## 2020-08-01 LAB — CBC
HCT: 44.6 % (ref 39.0–52.0)
Hemoglobin: 13.7 g/dL (ref 13.0–17.0)
MCH: 22.7 pg — ABNORMAL LOW (ref 26.0–34.0)
MCHC: 30.7 g/dL (ref 30.0–36.0)
MCV: 73.8 fL — ABNORMAL LOW (ref 80.0–100.0)
Platelets: 345 10*3/uL (ref 150–400)
RBC: 6.04 MIL/uL — ABNORMAL HIGH (ref 4.22–5.81)
RDW: 19.5 % — ABNORMAL HIGH (ref 11.5–15.5)
WBC: 7.3 10*3/uL (ref 4.0–10.5)
nRBC: 0 % (ref 0.0–0.2)

## 2020-08-01 LAB — TRIGLYCERIDES: Triglycerides: 106 mg/dL (ref <150)

## 2020-08-01 LAB — PHOSPHORUS: Phosphorus: 3 mg/dL (ref 2.5–4.6)

## 2020-08-01 LAB — MAGNESIUM: Magnesium: 2.2 mg/dL (ref 1.7–2.4)

## 2020-08-01 LAB — GLUCOSE, CAPILLARY
Glucose-Capillary: 131 mg/dL — ABNORMAL HIGH (ref 70–99)
Glucose-Capillary: 123 mg/dL — ABNORMAL HIGH (ref 70–99)
Glucose-Capillary: 135 mg/dL — ABNORMAL HIGH (ref 70–99)
Glucose-Capillary: 166 mg/dL — ABNORMAL HIGH (ref 70–99)

## 2020-08-01 LAB — CORTISOL-AM, BLOOD: Cortisol - AM: 60.6 ug/dL — ABNORMAL HIGH (ref 6.7–22.6)

## 2020-08-01 MED ORDER — SODIUM CHLORIDE 0.9 % IV SOLN: INTRAVENOUS | Status: AC

## 2020-08-01 MED ORDER — TRAVASOL 10 % IV SOLN
INTRAVENOUS | Status: AC
  Filled 2020-08-01: qty 572.4

## 2020-08-01 MED ORDER — SODIUM CHLORIDE 0.9% FLUSH: 10.0000 mL | INTRAVENOUS | Status: DC | PRN

## 2020-08-01 MED ORDER — DIATRIZOATE MEGLUMINE & SODIUM 66-10 % PO SOLN
90.0000 mL | Freq: Once | ORAL | Status: AC
  Administered 2020-08-01: 90 mL via NASOGASTRIC

## 2020-08-01 MED ORDER — INSULIN ASPART 100 UNIT/ML ~~LOC~~ SOLN
0.0000 [IU] | Freq: Four times a day (QID) | SUBCUTANEOUS | Status: DC
  Administered 2020-08-02 (×2): 1 [IU] via SUBCUTANEOUS
  Administered 2020-08-03 (×2): 2 [IU] via SUBCUTANEOUS
  Administered 2020-08-03: 3 [IU] via SUBCUTANEOUS
  Administered 2020-08-04: 1 [IU] via SUBCUTANEOUS
  Administered 2020-08-05: 2 [IU] via SUBCUTANEOUS
  Administered 2020-08-05: 4 [IU] via SUBCUTANEOUS
  Administered 2020-08-05: 2 [IU] via SUBCUTANEOUS
  Administered 2020-08-06: 4 [IU] via SUBCUTANEOUS
  Filled 2020-08-01 (×11): qty 1

## 2020-08-01 MED ORDER — CHLORHEXIDINE GLUCONATE CLOTH 2 % EX PADS
6.0000 | MEDICATED_PAD | Freq: Every day | CUTANEOUS | Status: DC
  Administered 2020-08-01 – 2020-08-26 (×25): 6 via TOPICAL

## 2020-08-01 MED ORDER — PROPRANOLOL HCL 10 MG PO TABS
10.0000 mg | ORAL_TABLET | Freq: Three times a day (TID) | ORAL | Status: DC
  Administered 2020-08-01 (×2): 10 mg
  Filled 2020-08-01 (×5): qty 1

## 2020-08-01 MED ORDER — PROPRANOLOL HCL 10 MG PO TABS
10.0000 mg | ORAL_TABLET | Freq: Three times a day (TID) | ORAL | Status: DC
  Administered 2020-08-01: 10 mg via ORAL
  Filled 2020-08-01: qty 1

## 2020-08-01 NOTE — Progress Notes (Addendum)
This pharmacist made two call attempts to d/w and education on refusal of DVT ppx (SCDs and lovenox). Pharmacy was unable to reach patient at this time. Will try again tomorrow.

## 2020-08-01 NOTE — Progress Notes (Signed)
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  10/27-Pt with dark high output bilious liquid coming from NGT. Plan picc for 10/28 for TPN  10/28-PICC placed. Pt sinus tachy.   Consultants:   surgery  Procedures: NGT, PICC  Antimicrobials:       Subjective: Pt states not hungry, but is thirsty. Denies pain   Objective: Vitals:   07/31/20 1954 07/31/20 2301 08/01/20 0556 08/01/20 0726  BP: (!) 154/106 (!) 149/103 (!) 157/107 (!) 157/104  Pulse: (!) 148 (!) 145 (!) 144 (!) 138  Resp: 16 16 18 20   Temp: 98.7 F (37.1 C) 98.1 F (36.7 C) 98.7 F (37.1 C) 98.8 F (37.1 C)  TempSrc: Oral Oral Oral Oral  SpO2: 94% 96% 98% 95%  Weight:      Height:        Intake/Output Summary (Last 24 hours) at 08/01/2020 0814 Last data filed at 08/01/2020 0501 Gross per 24 hour  Intake 5765.44 ml  Output 1600 ml  Net 4165.44 ml   Filed Weights   07/29/20 0538 07/29/20 2041 07/30/20 0500  Weight: 65.6 kg 65.5 kg 64 kg    Examination:  Appears tired, comfortable, NAD NG tube with lots of bilious liquid CTA, no wheeze rales rhonchi's Regular's tachycardic S1-S2 no murmurs Mildly tense, decrease bs, nt No edema  Neuro exam grossly intact Mood and affect appropriate in current setting  Data Reviewed: I have personally reviewed following labs and imaging studies  CBC: Recent Labs  Lab 07/28/20 0821 07/28/20 0821 07/28/20 2106 07/29/20 0556 07/30/20 2137 07/31/20 0609 08/01/20 0537   WBC 14.4*   < > 8.4 6.5 2.4* 2.6* 7.3  NEUTROABS 12.6*  --   --   --   --   --   --   HGB 14.7   < > 12.9* 13.0 13.7 13.4 13.7  HCT 47.7   < > 42.3 42.2 44.0 43.0 44.6  MCV 72.8*   < > 74.2* 74.0* 73.9* 73.8* 73.8*  PLT 347   < > 335 337 299 334 345   < > = values in this interval not displayed.   Basic Metabolic Panel: Recent Labs  Lab 07/28/20 2106 07/29/20 0556 07/30/20 0444 07/31/20 0609 08/01/20 0537  NA 139 138 143 141 143  K 3.7 3.7 3.7 3.8 3.9  CL 104 104 104 102 102  CO2 26 25 28 27 29   GLUCOSE 115* 103* 114* 157* 140*  BUN 13 11 11 17  26*  CREATININE 0.88 0.77 0.85 0.89 0.76  CALCIUM 8.8* 8.8* 9.3 8.8* 8.6*  MG 2.0 2.0 2.1  --  2.2  PHOS  --   --   --   --  3.0   GFR: Estimated Creatinine Clearance: 127.8 mL/min (by C-G formula based on SCr of 0.76 mg/dL). Liver Function Tests: Recent Labs  Lab 07/28/20 0821 07/29/20 0556 07/31/20 0609 08/01/20 0537  AST 31 15 16 16   ALT 16 11 10 11   ALKPHOS 118 93 62 52  BILITOT 1.1 0.9 0.6 0.5  PROT 8.6* 7.0 7.1 7.0  ALBUMIN 4.9 3.8  3.6 3.3*   Recent Labs  Lab 07/28/20 0821  LIPASE 18   No results for input(s): AMMONIA in the last 168 hours. Coagulation Profile: Recent Labs  Lab 07/28/20 2106  INR 1.1   Cardiac Enzymes: No results for input(s): CKTOTAL, CKMB, CKMBINDEX, TROPONINI in the last 168 hours. BNP (last 3 results) No results for input(s): PROBNP in the last 8760 hours. HbA1C: No results for input(s): HGBA1C in the last 72 hours. CBG: No results for input(s): GLUCAP in the last 168 hours. Lipid Profile: Recent Labs    08/01/20 0537  TRIG 106   Thyroid Function Tests: Recent Labs    07/31/20 0609 07/31/20 2152  TSH 0.101*  --   FREET4  --  0.74   Anemia Panel: No results for input(s): VITAMINB12, FOLATE, FERRITIN, TIBC, IRON, RETICCTPCT in the last 72 hours. Sepsis Labs: No results for input(s): PROCALCITON, LATICACIDVEN in the last 168 hours.  Recent Results (from the past 240  hour(s))  Respiratory Panel by RT PCR (Flu A&B, Covid) - Nasopharyngeal Swab     Status: None   Collection Time: 07/28/20 10:22 AM   Specimen: Nasopharyngeal Swab  Result Value Ref Range Status   SARS Coronavirus 2 by RT PCR NEGATIVE NEGATIVE Final    Comment: (NOTE) SARS-CoV-2 target nucleic acids are NOT DETECTED.  The SARS-CoV-2 RNA is generally detectable in upper respiratoy specimens during the acute phase of infection. The lowest concentration of SARS-CoV-2 viral copies this assay can detect is 131 copies/mL. A negative result does not preclude SARS-Cov-2 infection and should not be used as the sole basis for treatment or other patient management decisions. A negative result may occur with  improper specimen collection/handling, submission of specimen other than nasopharyngeal swab, presence of viral mutation(s) within the areas targeted by this assay, and inadequate number of viral copies (<131 copies/mL). A negative result must be combined with clinical observations, patient history, and epidemiological information. The expected result is Negative.  Fact Sheet for Patients:  https://www.moore.com/https://www.fda.gov/media/142436/download  Fact Sheet for Healthcare Providers:  https://www.young.biz/https://www.fda.gov/media/142435/download  This test is no t yet approved or cleared by the [place] FDA and  has been authorized for detection and/or diagnosis of SARS-CoV-2 by FDA under an Emergency Use Authorization (EUA). This EUA will remain  in effect (meaning this test can be used) for the duration of the COVID-19 declaration under Section 564(b)(1) of the Act, 21 U.S.C. section 360bbb-3(b)(1), unless the authorization is terminated or revoked sooner.     Influenza A by PCR NEGATIVE NEGATIVE Final   Influenza B by PCR NEGATIVE NEGATIVE Final    Comment: (NOTE) The Xpert Xpress SARS-CoV-2/FLU/RSV assay is intended as an aid in  the diagnosis of influenza from Nasopharyngeal swab specimens and  should not  be used as a sole basis for treatment. Nasal washings and  aspirates are unacceptable for Xpert Xpress SARS-CoV-2/FLU/RSV  testing.  Fact Sheet for Patients: https://www.moore.com/https://www.fda.gov/media/142436/download  Fact Sheet for Healthcare Providers: https://www.young.biz/https://www.fda.gov/media/142435/download  This test is not yet approved or cleared by the [place] FDA and  has been authorized for detection and/or diagnosis of SARS-CoV-2 by  FDA under an Emergency Use Authorization (EUA). This EUA will remain  in effect (meaning this test can be used) for the duration of the  Covid-19 declaration under Section 564(b)(1) of the Act, 21  U.S.C. section 360bbb-3(b)(1), unless the authorization is  terminated or revoked. Performed at [facility] [place], [address] [place], [place] [address]  Radiology Studies: DG Chest Port 1 View  Result Date: 07/30/2020 CLINICAL DATA:  Tachycardia EXAM: PORTABLE CHEST 1 VIEW COMPARISON:  CT 07/28/2020, abdominal radiograph 07/30/2020 FINDINGS: Transesophageal tube tip coiling in the left upper quadrant with the side port beyond the GE junction. No consolidation, features of edema, pneumothorax, or effusion. The cardiomediastinal contours are unremarkable. Multiple air distended loops of bowel seen in the upper abdomen compatible with a previously demonstrated bowel obstruction. No other acute or suspicious osseous or soft tissue abnormality. IMPRESSION: 1. Transesophageal tube tip coiling in the left upper quadrant with the side port beyond the GE junction. 2. Multiple air distended loops of bowel in the upper abdomen compatible with a previously demonstrated bowel obstruction. 3. No acute cardiopulmonary abnormality. Electronically Signed   By: Kreg Shropshire M.D.   On: 07/30/2020 20:50   DG Abd 2 Views  Result Date: 08/01/2020 CLINICAL DATA:  Small-bowel obstruction, abdominal distension, patient reports feeling less distended and less tight, slightly  better EXAM: ABDOMEN - 2 VIEW COMPARISON:  07/31/2020 FINDINGS: Nasogastric tube coiled in proximal stomach, stomach decompressed. Marked distention of small bowel loops throughout the upper and mid abdomen consistent with high-grade small bowel obstruction, unchanged. Small amount of gas and contrast in rectum. Otherwise no colonic gas identified. No definite bowel wall thickening or free air. Osseous structures unremarkable. Visualized lung bases clear. IMPRESSION: Persistent high-grade small bowel obstruction. Electronically Signed   By: Ulyses Southward M.D.   On: 08/01/2020 08:09   DG ABD ACUTE 2+V W 1V CHEST  Result Date: 07/31/2020 CLINICAL DATA:  Small bowel obstruction. EXAM: DG ABDOMEN ACUTE WITH 1 VIEW CHEST COMPARISON:  Chest radiograph and abdomen radiograph July 30, 2020 FINDINGS: AP chest: Lungs are clear. Heart size and pulmonary vascularity are normal. No adenopathy. Nasogastric tube tip and side port in stomach. Supine and upright abdomen: Nasogastric tube tip and side port in stomach. There are multiple loops of dilated small bowel in a pattern indicative of a degree of bowel obstruction. No free air appreciable. Contrast seen in the rectum. IMPRESSION: Nasogastric tube tip and side port in stomach. Multiple loops of dilated proximal mid small bowel in a pattern felt to be indicative of small bowel obstruction. No evident free air. Lungs clear. Electronically Signed   By: Bretta Bang III M.D.   On: 07/31/2020 08:31   DG Abd Portable 1V  Result Date: 07/30/2020 CLINICAL DATA:  NG tube placement EXAM: PORTABLE ABDOMEN - 1 VIEW COMPARISON:  07/30/2020 FINDINGS: NG tube coiled in the stomach with the tip near the GE junction. Dilated large and small bowel loops unchanged from the prior study. IMPRESSION: NG tube coiled in the stomach with the tip in the gastric fundus Dilated large and small bowel loops unchanged. Electronically Signed   By: Marlan Palau M.D.   On: 07/30/2020 11:54    [place] EKG SITE RITE  Result Date: 07/31/2020 If Site Rite image not attached, placement could not be confirmed due to current cardiac rhythm.       Scheduled Meds: . enoxaparin (LOVENOX) injection  40 mg Subcutaneous Q24H  . insulin aspart  0-6 Units Subcutaneous Q6H  . propranolol  10 mg Oral TID   Continuous Infusions: . 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  #Small bowel obstruction -10/26-- NG tube replaced by surgery. Patient follows with Dr. Jamal Maes is in the process of getting anal/rectal manometry 10/28- still with high output NG tube Status post PICC placement today and will be started on TPN Per surgery since he is failing to make any significant clinical progress, plan for Gastrografin challenge today.  Plan for tentatively posted him for exploratory laparotomy tomorrow pending his clinical condition and Gastrografin challenge. abd xr today-Persistent high-grade small bowel obstruction.  #Leukocytosis-resolved.   Wbc improved to 7.3 Afebrile    #Chronic tobacco abuse.  Continue with nicotine patch  #sinus tachycardia- pt is asx. Per mom had this issue in [place] in the past when was hospitalized Possibly inappropriate sinus tachy v.s. response to current medical issues and high oupt NGT Continue with ivf Pain mx Will add propanolol     DVT prophylaxis: Lovenox Code  Status: Full Family Communication: none at bedside  Status is: Inpatient  Remains inpatient appropriate because:Inpatient level of care appropriate due to severity of illness   Dispo: The patient is from: Home              Anticipated d/c is to: Home              Anticipated d/c date is: > 3 days              Patient currently is not medically stable to d/c.            LOS: 4 days   Time spent: 45 min with >50% on coc    Lynn Ito, MD [facility] Pager [phone]  If 7PM-7AM, please contact night-coverage [email or web address] Password [email or web address] 08/01/2020, 8:14 AM

## 2020-08-01 NOTE — Progress Notes (Signed)
Patient with persistent tachycardia in the upper 150s out of proportion to current dx. No fevers or other associated symptoms of dyspnea, palpitations, n/v, or tremors. On reviewing his labs, TSH is very low concerning for hyperthyroid state.  -Will check freeT4, T3 and Cortisol levels to r/o concurrent adrenal insufficiency -ECG -May benefit from Propanol however may not be able to tolerate po at this time. -Consider PTU pending labs as above    Webb Silversmith, BSN, MSN, DNP, CCRN,FNP-C  [facility] Nurse Practitioner  [facility]

## 2020-08-01 NOTE — Consult Note (Signed)
PHARMACY - TOTAL PARENTERAL NUTRITION CONSULT NOTE   Indication: Small bowel obstruction  Patient Measurements: Height: 5\' 10"  (177.8 cm) Weight: 64 kg (141 lb 1.5 oz) IBW/kg (Calculated) : 73 TPN AdjBW (KG): 65.5 Body mass index is 20.24 kg/m. Usual Weight: 64 kg  Assessment: Thomas Rivas is a 24 y.o. 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 Patient with poor appetite and oral intake for the past two days and is at moderate refeeding risk.   Glucose / Insulin: 123-140 mg/dL Electrolytes: WNL Renal: 0.76 LFTs / TGs:          AST/ALT 16/11         TG 106 Prealbumin / albumin: albumin 3.3 Intake / Output; MIVF:                          -- NS @150  mL/hr                         --10/27- 150 mL via ileostomy 10/27 0701 - 10/28 0700 In: 5765.4 [I.V.:5765.4] Out: 1600 [Urine:700; Emesis/NG output:900]   GI Imaging: are available for review 10/24-28/2021 Surgeries / Procedures: no surgeries; Gastrografin challenge 10/29  Central access: PICC line placed 08/01/20 TPN start date: 07/2819/21  Nutritional Goals (per RD recommendation on 10/27): kCal: 2100-2400kcal/day, Protein: 110-120g/day, Fluid: 2.0-2.3L/day Goal TPN rate is 90 mL/hr (provides ~110 g of protein and ~ 2400 kcals per day)  Current Nutrition:  NPO  Plan:   Start TPN at 72mL/hr at 1800  Protein 228.96 kcal; 19.03%; 57.24 g  Dextrose 605.88 kcal; 50.38 %; 178.2 g  Lipids 368 kcal; 30.59%; 36.8 g  Electrolytes in TPN: 29mEq/L of Na, 63mEq/L of K, 31mEq/L of Ca, 61mEq/L of Mg, and 76mmol/L of Phos. Cl:Ac ratio 1:1  Add standard MVI and trace elements to TPN  Initiate Sensitive q6h SSI and adjust as needed   Reduce MIVF to 55 mL/hr at 1800  TPN labs for new start: CMP, Mg, Phosphorus, albumin for the first 3 days    Monitor TPN labs on Mon/Thurs: CMP, Mg, Phosphorus, albumin, TG, hepatic panel, albumin   4m, PharmD Clinical Pharmacist

## 2020-08-01 NOTE — Progress Notes (Signed)
[facility] SURGICAL PROGRESS NOTE (cpt [phone])  Hospital Day(s): 4.   Interval History: Patient seen and examined. Overnight, had issues with tachycardia. This morning, patient reports his NGT output is clearing but does not feel much improvement otherwise. He continues to have abdominal distension. No fever, chills, nausea, emesis. Labs are reassuring this morning and he is without leukocytosis, renal function normal, and without electrolyte derangements. Unfortunately, KUB still with markedly distended loops of bowel. NGT output recorded at 900 ccs in last 24 hours. Minimal ileostomy output. He was started on TPN last night as well.   Review of Systems:  Constitutional: denies fever, chills  HEENT: denies cough or congestion  Respiratory: denies any shortness of breath  Cardiovascular: denies chest pain or palpitations  Gastrointestinal: + abdominal pain, + distension, denied N/V, or diarrhea/and bowel function as per interval history Genitourinary: denies burning with urination or urinary frequency   Vital signs in last 24 hours: [min-max] current  Temp:  [98.1 F (36.7 C)-99.2 F (37.3 C)] 98.7 F (37.1 C) (10/28 0556) Pulse Rate:  [142-150] 144 (10/28 0556) Resp:  [16-18] 18 (10/28 0556) BP: (149-157)/(98-107) 157/107 (10/28 0556) SpO2:  [94 %-100 %] 98 % (10/28 0556)     Height: 5\' 10"  (177.8 cm) Weight: 64 kg BMI (Calculated): 20.24   Intake/Output last 2 shifts:  10/27 0701 - 10/28 0700 In: 5765.4 [I.V.:5765.4] Out: 1600 [Urine:700; Emesis/NG output:900]   Physical Exam:  Constitutional: alert, cooperative and no distress  HENT: normocephalic without obvious abnormality, NGT in place Eyes: PERRL, EOM's grossly intact and symmetric  Respiratory: breathing non-labored at rest  Cardiovascular: tachycardic and sinus rhythm  Gastrointestinal:Soft, markedly distended and tympanic, no rebound/guarding, ileostomy in LLQ with minimal stool in  bag Musculoskeletal: no edema or wounds, motor and sensation grossly intact, NT   Labs:  CBC Latest Ref Rng & Units 08/01/2020 07/31/2020 07/30/2020  WBC 4.0 - 10.5 K/uL 7.3 2.6(L) 2.4(L)  Hemoglobin 13.0 - 17.0 g/dL 08/01/2020 34.9 17.9  Hematocrit 39 - 52 % 44.6 43.0 44.0  Platelets 150 - 400 K/uL 345 334 299   CMP Latest Ref Rng & Units 08/01/2020 07/31/2020 07/30/2020  Glucose 70 - 99 mg/dL 08/01/2020) 569(V) 948(A)  BUN 6 - 20 mg/dL 165(V) 17 11  Creatinine 0.61 - 1.24 mg/dL 37(S 8.27 0.78  Sodium 135 - 145 mmol/L 143 141 143  Potassium 3.5 - 5.1 mmol/L 3.9 3.8 3.7  Chloride 98 - 111 mmol/L 102 102 104  CO2 22 - 32 mmol/L 29 27 28   Calcium 8.9 - 10.3 mg/dL 6.75) ) 9.3  Total Protein 6.5 - 8.1 g/dL 7.0 7.1 -  Total Bilirubin 0.3 - 1.2 mg/dL 0.5 0.6 -  Alkaline Phos 38 - 126 U/L 52 62 -  AST 15 - 41 U/L 16 16 -  ALT 0 - 44 U/L 11 10 -    Imaging studies:   KUB (08/01/2020) personally reviewed showing persistently dilated loops of bowel in the upper abdomen, no significant improvement, and radiologist report reviewed below:  IMPRESSION: Persistent high-grade small bowel obstruction.   Assessment/Plan: (ICD-10's: K24.609) 24 y.o. 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 I will initiate gastrografin challenge today. We will tentatively post him  for exploratory laparotomy tomorrow pending his clinical condition and gastrografin challenge.    - Continue NGT decompression; LIS; monitor and record output             - NPO + IVF resuscitation - Monitor abdominal examination; on-going ileostomy function - Pain  control prn (minimize narcotics); antiemetics prn - Mobilization encouraged - No surgical intervention - Further management per primary service  All of the above findings and recommendations were discussed with the patient, and the medical team, and all of patient's questions were answered to his expressed satisfaction.  Face-to-face time spent with the patient and care providers was 25 minutes, with more than 50% of the time spent counseling, educating, and coordinating care of the patient  -- Lynden Oxford, PA-C [facility] 08/01/2020, 7:22 AM [phone] M-F: 7am - 4pm

## 2020-08-01 NOTE — Progress Notes (Signed)
Peripherally Inserted Central Catheter Placement  The IV Nurse has discussed with the patient and/or persons authorized to consent for the patient, the purpose of this procedure and the potential benefits and risks involved with this procedure.  The benefits include less needle sticks, lab draws from the catheter, and the patient may be discharged home with the catheter. Risks include, but not limited to, infection, bleeding, blood clot (thrombus formation), and puncture of an artery; nerve damage and irregular heartbeat and possibility to perform a PICC exchange if needed/ordered by physician.  Alternatives to this procedure were also discussed.  Bard Power PICC patient education guide, fact sheet on infection prevention and patient information card has been provided to patient /or left at bedside.    PICC Placement Documentation  PICC Double Lumen 08/01/20 PICC Right Cephalic 38 cm 0 cm (Active)  Indication for Insertion or Continuance of Line Administration of hyperosmolar/irritating solutions (i.e. TPN, Vancomycin, etc.) 08/01/20 1107  Exposed Catheter (cm) 0 cm 08/01/20 1107  Site Assessment Clean;Dry;Intact 08/01/20 1107  Lumen #1 Status Flushed;Blood return noted;Saline locked 08/01/20 1107  Lumen #2 Status Flushed;Blood return noted;Saline locked 08/01/20 1107  Dressing Type Transparent 08/01/20 1107  Dressing Status Clean;Intact;Dry 08/01/20 1107  Antimicrobial disc in place? Yes 08/01/20 1107  Dressing Change Due 08/08/20 08/01/20 1107       Thomas Rivas 08/01/2020, 11:09 AM

## 2020-08-02 ENCOUNTER — Inpatient Hospital Stay: Payer: Medicaid Other

## 2020-08-02 ENCOUNTER — Inpatient Hospital Stay: Payer: Medicaid Other | Admitting: Anesthesiology

## 2020-08-02 ENCOUNTER — Inpatient Hospital Stay
Admission: AD | Admit: 2020-08-02 | Payer: Medicaid Other | Source: Other Acute Inpatient Hospital | Admitting: Internal Medicine

## 2020-08-02 ENCOUNTER — Encounter
Admission: AD | Disposition: A | Discharge status: Home / Self Care | Payer: Self-pay | Source: Other Acute Inpatient Hospital | Attending: Surgery

## 2020-08-02 DIAGNOSIS — K56609 Unspecified intestinal obstruction, unspecified as to partial versus complete obstruction: Secondary | ICD-10-CM | POA: Diagnosis not present

## 2020-08-02 DIAGNOSIS — K565 Intestinal adhesions [bands], unspecified as to partial versus complete obstruction: Secondary | ICD-10-CM

## 2020-08-02 HISTORY — PX: LAPAROTOMY: SHX154

## 2020-08-02 LAB — TRIGLYCERIDES: Triglycerides: 239 mg/dL — ABNORMAL HIGH (ref <150)

## 2020-08-02 LAB — COMPREHENSIVE METABOLIC PANEL
ALT: 10 U/L (ref 0–44)
AST: 16 U/L (ref 15–41)
Albumin: 3 g/dL — ABNORMAL LOW (ref 3.5–5.0)
Alkaline Phosphatase: 50 U/L (ref 38–126)
Anion gap: 13 (ref 5–15)
BUN: 32 mg/dL — ABNORMAL HIGH (ref 6–20)
CO2: 28 mmol/L (ref 22–32)
Calcium: 8.7 mg/dL — ABNORMAL LOW (ref 8.9–10.3)
Chloride: 104 mmol/L (ref 98–111)
Creatinine, Ser: 0.78 mg/dL (ref 0.61–1.24)
GFR, Estimated: >60 mL/min (ref >60)
Glucose, Bld: 174 mg/dL — ABNORMAL HIGH (ref 70–99)
Potassium: 4.1 mmol/L (ref 3.5–5.1)
Sodium: 145 mmol/L (ref 135–145)
Total Bilirubin: 0.5 mg/dL (ref 0.3–1.2)
Total Protein: 6.6 g/dL (ref 6.5–8.1)

## 2020-08-02 LAB — CBC
HCT: 44.5 % (ref 39.0–52.0)
Hemoglobin: 13.9 g/dL (ref 13.0–17.0)
MCH: 23.1 pg — ABNORMAL LOW (ref 26.0–34.0)
MCHC: 31.2 g/dL (ref 30.0–36.0)
MCV: 73.9 fL — ABNORMAL LOW (ref 80.0–100.0)
Platelets: 328 10*3/uL (ref 150–400)
RBC: 6.02 MIL/uL — ABNORMAL HIGH (ref 4.22–5.81)
RDW: 19.6 % — ABNORMAL HIGH (ref 11.5–15.5)
WBC: 7 10*3/uL (ref 4.0–10.5)
nRBC: 2.3 % — ABNORMAL HIGH (ref 0.0–0.2)

## 2020-08-02 LAB — GLUCOSE, CAPILLARY
Glucose-Capillary: 152 mg/dL — ABNORMAL HIGH (ref 70–99)
Glucose-Capillary: 155 mg/dL — ABNORMAL HIGH (ref 70–99)
Glucose-Capillary: 161 mg/dL — ABNORMAL HIGH (ref 70–99)
Glucose-Capillary: 165 mg/dL — ABNORMAL HIGH (ref 70–99)
Glucose-Capillary: 206 mg/dL — ABNORMAL HIGH (ref 70–99)

## 2020-08-02 LAB — T3, FREE: T3, Free: 1.3 pg/mL — ABNORMAL LOW (ref 2.0–4.4)

## 2020-08-02 LAB — PREALBUMIN: Prealbumin: 5.2 mg/dL — ABNORMAL LOW (ref 18–38)

## 2020-08-02 LAB — MAGNESIUM: Magnesium: 2.3 mg/dL (ref 1.7–2.4)

## 2020-08-02 LAB — PHOSPHORUS: Phosphorus: 2.7 mg/dL (ref 2.5–4.6)

## 2020-08-02 SURGERY — LAPAROTOMY, EXPLORATORY
Anesthesia: General

## 2020-08-02 MED ORDER — FENTANYL CITRATE (PF) 100 MCG/2ML IJ SOLN
INTRAMUSCULAR | Status: AC
  Filled 2020-08-02: qty 2

## 2020-08-02 MED ORDER — METOPROLOL TARTRATE 5 MG/5ML IV SOLN
2.5000 mg | Freq: Four times a day (QID) | INTRAVENOUS | Status: DC
  Administered 2020-08-02 – 2020-08-03 (×4): 2.5 mg via INTRAVENOUS
  Filled 2020-08-02 (×5): qty 5

## 2020-08-02 MED ORDER — BUPIVACAINE-EPINEPHRINE (PF) 0.25% -1:200000 IJ SOLN
INTRAMUSCULAR | Status: AC
  Filled 2020-08-02: qty 30

## 2020-08-02 MED ORDER — HYDROMORPHONE HCL 1 MG/ML IJ SOLN
INTRAMUSCULAR | Status: AC
  Administered 2020-08-02: 0.5 mg via INTRAVENOUS
  Filled 2020-08-02: qty 0.5

## 2020-08-02 MED ORDER — BUPIVACAINE LIPOSOME 1.3 % IJ SUSP
INTRAMUSCULAR | Status: AC
  Filled 2020-08-02: qty 20

## 2020-08-02 MED ORDER — BUPIVACAINE-EPINEPHRINE (PF) 0.25% -1:200000 IJ SOLN
INTRAMUSCULAR | Status: DC | PRN
  Administered 2020-08-02: 30 mL via PERINEURAL

## 2020-08-02 MED ORDER — HYDROMORPHONE HCL 1 MG/ML IJ SOLN
INTRAMUSCULAR | Status: DC | PRN
Start: 2020-08-02 — End: 2020-08-02
  Administered 2020-08-02 (×2): .5 mg via INTRAVENOUS

## 2020-08-02 MED ORDER — LACTATED RINGERS IV SOLN: INTRAVENOUS | Status: DC | PRN

## 2020-08-02 MED ORDER — ALBUMIN HUMAN 5 % IV SOLN
INTRAVENOUS | Status: AC
  Filled 2020-08-02: qty 250

## 2020-08-02 MED ORDER — ALBUMIN HUMAN 5 % IV SOLN: INTRAVENOUS | Status: DC | PRN

## 2020-08-02 MED ORDER — CHLORHEXIDINE GLUCONATE 0.12 % MT SOLN: 15.0000 mL | Freq: Two times a day (BID) | OROMUCOSAL | Status: DC

## 2020-08-02 MED ORDER — EPHEDRINE SULFATE 50 MG/ML IJ SOLN
INTRAMUSCULAR | Status: DC | PRN
  Administered 2020-08-02: 10 mg via INTRAVENOUS

## 2020-08-02 MED ORDER — METOPROLOL TARTRATE 5 MG/5ML IV SOLN
5.0000 mg | Freq: Once | INTRAVENOUS | Status: AC
  Administered 2020-08-02: 5 mg via INTRAVENOUS

## 2020-08-02 MED ORDER — SUCCINYLCHOLINE CHLORIDE 20 MG/ML IJ SOLN
INTRAMUSCULAR | Status: DC | PRN
  Administered 2020-08-02: 80 mg via INTRAVENOUS

## 2020-08-02 MED ORDER — LIDOCAINE HCL (PF) 2 % IJ SOLN
INTRAMUSCULAR | Status: DC | PRN
  Administered 2020-08-02: 1.5 mg/kg/h via INTRADERMAL

## 2020-08-02 MED ORDER — PHENYLEPHRINE HCL (PRESSORS) 10 MG/ML IV SOLN
INTRAVENOUS | Status: DC | PRN
  Administered 2020-08-02 (×2): 200 ug via INTRAVENOUS
  Administered 2020-08-02: 100 ug via INTRAVENOUS

## 2020-08-02 MED ORDER — PROPOFOL 10 MG/ML IV BOLUS
INTRAVENOUS | Status: DC | PRN
  Administered 2020-08-02: 120 mg via INTRAVENOUS

## 2020-08-02 MED ORDER — CEFAZOLIN SODIUM 1 G IJ SOLR
INTRAMUSCULAR | Status: AC
  Filled 2020-08-02: qty 20

## 2020-08-02 MED ORDER — ONDANSETRON HCL 4 MG/2ML IJ SOLN
INTRAMUSCULAR | Status: DC | PRN
  Administered 2020-08-02: 4 mg via INTRAVENOUS

## 2020-08-02 MED ORDER — CEFAZOLIN SODIUM-DEXTROSE 2-3 GM-%(50ML) IV SOLR
INTRAVENOUS | Status: DC | PRN
  Administered 2020-08-02: 2 g via INTRAVENOUS

## 2020-08-02 MED ORDER — METOPROLOL TARTRATE 5 MG/5ML IV SOLN
INTRAVENOUS | Status: AC
  Filled 2020-08-02: qty 5

## 2020-08-02 MED ORDER — SODIUM CHLORIDE 0.9 % IV SOLN
2.0000 g | Freq: Two times a day (BID) | INTRAVENOUS | Status: DC
  Administered 2020-08-02 – 2020-08-04 (×5): 2 g via INTRAVENOUS
  Filled 2020-08-02 (×8): qty 2

## 2020-08-02 MED ORDER — KETOROLAC TROMETHAMINE 30 MG/ML IJ SOLN
30.0000 mg | Freq: Four times a day (QID) | INTRAMUSCULAR | Status: DC | PRN
  Administered 2020-08-04 – 2020-08-07 (×6): 30 mg via INTRAVENOUS
  Filled 2020-08-02 (×6): qty 1

## 2020-08-02 MED ORDER — MIDAZOLAM HCL 2 MG/2ML IJ SOLN
INTRAMUSCULAR | Status: DC | PRN
  Administered 2020-08-02: 2 mg via INTRAVENOUS

## 2020-08-02 MED ORDER — SODIUM CHLORIDE 0.9 % IV SOLN
INTRAVENOUS | Status: DC | PRN
  Administered 2020-08-02: 70 mL

## 2020-08-02 MED ORDER — HYDROMORPHONE HCL 1 MG/ML IJ SOLN: 0.5000 mg | Freq: Once | INTRAMUSCULAR | Status: AC

## 2020-08-02 MED ORDER — METRONIDAZOLE IN NACL 5-0.79 MG/ML-% IV SOLN
500.0000 mg | Freq: Once | INTRAVENOUS | Status: DC
  Filled 2020-08-02: qty 100

## 2020-08-02 MED ORDER — SODIUM CHLORIDE 0.9 % IV SOLN: INTRAVENOUS | Status: DC

## 2020-08-02 MED ORDER — SODIUM CHLORIDE 0.9 % IV SOLN: INTRAVENOUS | Status: DC | PRN

## 2020-08-02 MED ORDER — KETAMINE HCL 50 MG/ML IJ SOLN
INTRAMUSCULAR | Status: AC
  Filled 2020-08-02: qty 10

## 2020-08-02 MED ORDER — TRAVASOL 10 % IV SOLN
INTRAVENOUS | Status: AC
  Filled 2020-08-02: qty 1144.8

## 2020-08-02 MED ORDER — METRONIDAZOLE IN NACL 5-0.79 MG/ML-% IV SOLN
INTRAVENOUS | Status: DC | PRN
  Administered 2020-08-02: 500 mg via INTRAVENOUS

## 2020-08-02 MED ORDER — ACETAMINOPHEN 10 MG/ML IV SOLN
INTRAVENOUS | Status: AC
  Filled 2020-08-02: qty 100

## 2020-08-02 MED ORDER — ROCURONIUM BROMIDE 100 MG/10ML IV SOLN
INTRAVENOUS | Status: DC | PRN
  Administered 2020-08-02: 30 mg via INTRAVENOUS
  Administered 2020-08-02: 20 mg via INTRAVENOUS
  Administered 2020-08-02: 50 mg via INTRAVENOUS

## 2020-08-02 MED ORDER — PROMETHAZINE HCL 25 MG/ML IJ SOLN: 6.2500 mg | INTRAMUSCULAR | Status: DC | PRN

## 2020-08-02 MED ORDER — SODIUM CHLORIDE 0.9 % IV SOLN
INTRAVENOUS | Status: DC | PRN
  Administered 2020-08-02: 50 ug/min via INTRAVENOUS

## 2020-08-02 MED ORDER — SODIUM CHLORIDE (PF) 0.9 % IJ SOLN
INTRAMUSCULAR | Status: AC
  Filled 2020-08-02: qty 50

## 2020-08-02 MED ORDER — HYDROMORPHONE HCL 1 MG/ML IJ SOLN
INTRAMUSCULAR | Status: AC
  Administered 2020-08-02: 0.5 mg via INTRAVENOUS
  Filled 2020-08-02: qty 1

## 2020-08-02 MED ORDER — ORAL CARE MOUTH RINSE
15.0000 mL | Freq: Two times a day (BID) | OROMUCOSAL | Status: DC
  Administered 2020-08-06 – 2020-08-17 (×12): 15 mL via OROMUCOSAL

## 2020-08-02 MED ORDER — HYDROMORPHONE HCL 1 MG/ML IJ SOLN
INTRAMUSCULAR | Status: AC
  Filled 2020-08-02: qty 1

## 2020-08-02 MED ORDER — SUGAMMADEX SODIUM 200 MG/2ML IV SOLN
INTRAVENOUS | Status: DC | PRN
  Administered 2020-08-02: 200 mg via INTRAVENOUS

## 2020-08-02 MED ORDER — DEXMEDETOMIDINE (PRECEDEX) IN NS 20 MCG/5ML (4 MCG/ML) IV SYRINGE
PREFILLED_SYRINGE | INTRAVENOUS | Status: AC
  Filled 2020-08-02: qty 10

## 2020-08-02 MED ORDER — LIDOCAINE HCL (CARDIAC) PF 100 MG/5ML IV SOSY
PREFILLED_SYRINGE | INTRAVENOUS | Status: DC | PRN
  Administered 2020-08-02: 100 mg via INTRAVENOUS

## 2020-08-02 MED ORDER — DEXAMETHASONE SODIUM PHOSPHATE 10 MG/ML IJ SOLN
INTRAMUSCULAR | Status: DC | PRN
  Administered 2020-08-02: 8 mg via INTRAVENOUS

## 2020-08-02 MED ORDER — CHLORHEXIDINE GLUCONATE 0.12 % MT SOLN
15.0000 mL | Freq: Two times a day (BID) | OROMUCOSAL | Status: DC
  Administered 2020-08-03 – 2020-08-19 (×28): 15 mL via OROMUCOSAL
  Filled 2020-08-02 (×29): qty 15

## 2020-08-02 MED ORDER — HYDROMORPHONE HCL 1 MG/ML IJ SOLN
0.5000 mg | Freq: Once | INTRAMUSCULAR | Status: AC
  Administered 2020-08-02: 0.5 mg via INTRAVENOUS

## 2020-08-02 MED ORDER — MIDAZOLAM HCL 2 MG/2ML IJ SOLN
INTRAMUSCULAR | Status: AC
  Filled 2020-08-02: qty 2

## 2020-08-02 MED ORDER — HYDROMORPHONE HCL 1 MG/ML IJ SOLN
0.2500 mg | INTRAMUSCULAR | Status: DC | PRN
  Administered 2020-08-02: 0.5 mg via INTRAVENOUS

## 2020-08-02 MED ORDER — VASOPRESSIN 20 UNIT/ML IV SOLN
INTRAVENOUS | Status: DC | PRN
  Administered 2020-08-02: 1 [IU] via INTRAVENOUS

## 2020-08-02 MED ORDER — FENTANYL CITRATE (PF) 100 MCG/2ML IJ SOLN
INTRAMUSCULAR | Status: DC | PRN
  Administered 2020-08-02 (×4): 50 ug via INTRAVENOUS

## 2020-08-02 MED ORDER — KETOROLAC TROMETHAMINE 30 MG/ML IJ SOLN
INTRAMUSCULAR | Status: AC
  Administered 2020-08-02: 30 mg via INTRAVENOUS
  Filled 2020-08-02: qty 1

## 2020-08-02 SURGICAL SUPPLY — 57 items
APL PRP STRL LF DISP 70% ISPRP (MISCELLANEOUS) ×1
APPLIER CLIP 11 MED OPEN (CLIP)
APPLIER CLIP 13 LRG OPEN (CLIP)
APR CLP LRG 13 20 CLIP (CLIP)
APR CLP MED 11 20 MLT OPN (CLIP)
BARRIER ADH SEPRAFILM 3INX5IN (MISCELLANEOUS) ×6 IMPLANT
BASIN GRAD PLASTIC 32OZ STRL (MISCELLANEOUS) ×3 IMPLANT
BLADE CLIPPER SURG (BLADE) ×3 IMPLANT
BLADE SURG SZ10 CARB STEEL (BLADE) ×3 IMPLANT
BRR ADH 5X3 SEPRAFILM 2 SHT (MISCELLANEOUS) ×2
BRR ADH 5X3 SEPRAFILM 6 SHT (MISCELLANEOUS) ×1
CANISTER SUCT 3000ML PPV (MISCELLANEOUS) ×3 IMPLANT
CHLORAPREP W/TINT 26 (MISCELLANEOUS) ×3 IMPLANT
CLIP APPLIE 11 MED OPEN (CLIP) IMPLANT
CLIP APPLIE 13 LRG OPEN (CLIP) IMPLANT
COVER BACK TABLE REUSABLE LG (DRAPES) ×3 IMPLANT
COVER CLAMP SIL LG PBX B (MISCELLANEOUS) ×3 IMPLANT
COVER WAND RF STERILE (DRAPES) ×3 IMPLANT
DRAPE LAPAROTOMY 100X77 ABD (DRAPES) ×3 IMPLANT
DRSG OPSITE POSTOP 4X10 (GAUZE/BANDAGES/DRESSINGS) IMPLANT
DRSG OPSITE POSTOP 4X12 (GAUZE/BANDAGES/DRESSINGS) IMPLANT
ELECT BLADE 6.5 EXT (BLADE) ×3 IMPLANT
ELECT REM PT RETURN 9FT ADLT (ELECTROSURGICAL) ×3
ELECTRODE REM PT RTRN 9FT ADLT (ELECTROSURGICAL) ×1 IMPLANT
GLOVE BIO SURGEON STRL SZ7 (GLOVE) ×3 IMPLANT
GOWN STRL REUS W/ TWL LRG LVL3 (GOWN DISPOSABLE) ×2 IMPLANT
GOWN STRL REUS W/TWL LRG LVL3 (GOWN DISPOSABLE) ×6
HANDLE SUCTION POOLE (INSTRUMENTS) ×1 IMPLANT
HANDLE YANKAUER SUCT BULB TIP (MISCELLANEOUS) ×6 IMPLANT
KIT OSTOMY 2 PC DRNBL 2.25 STR (WOUND CARE) ×1 IMPLANT
KIT OSTOMY DRAINABLE 2.25 STR (WOUND CARE) ×3
LIGASURE IMPACT 36 18CM CVD LR (INSTRUMENTS) IMPLANT
NEEDLE HYPO 22GX1.5 SAFETY (NEEDLE) ×6 IMPLANT
PACK BASIN MAJOR ARMC (MISCELLANEOUS) ×3 IMPLANT
RELOAD PROXIMATE 75MM BLUE (ENDOMECHANICALS) IMPLANT
SEPRAFILM PROCEDURAL PACK 3X5 (MISCELLANEOUS) ×3 IMPLANT
SPONGE KITTNER 5P (MISCELLANEOUS) ×3 IMPLANT
SPONGE LAP 18X18 RF (DISPOSABLE) ×3 IMPLANT
SPONGE LAP 18X36 RFD (DISPOSABLE) IMPLANT
STAPLER PROXIMATE 75MM BLUE (STAPLE) IMPLANT
STAPLER SKIN PROX 35W (STAPLE) ×3 IMPLANT
SUCTION POOLE HANDLE (INSTRUMENTS) ×3
SUT PDS AB 0 CT1 27 (SUTURE) ×9 IMPLANT
SUT SILK 2 0 (SUTURE) ×3
SUT SILK 2 0 SH CR/8 (SUTURE) ×3 IMPLANT
SUT SILK 2 0SH CR/8 30 (SUTURE) ×3 IMPLANT
SUT SILK 2-0 18XBRD TIE 12 (SUTURE) ×1 IMPLANT
SUT VIC AB 0 CT1 36 (SUTURE) ×6 IMPLANT
SUT VIC AB 2-0 SH 27 (SUTURE) ×6
SUT VIC AB 2-0 SH 27XBRD (SUTURE) ×2 IMPLANT
SUT VIC AB 3-0 SH 27 (SUTURE) ×3
SUT VIC AB 3-0 SH 27X BRD (SUTURE) ×1 IMPLANT
SYR 30ML LL (SYRINGE) ×6 IMPLANT
SYR 3ML LL SCALE MARK (SYRINGE) ×3 IMPLANT
SYR TOOMEY IRRIG 70ML (MISCELLANEOUS) ×3
SYRINGE TOOMEY IRRIG 70ML (MISCELLANEOUS) ×1 IMPLANT
TRAY FOLEY MTR SLVR 16FR STAT (SET/KITS/TRAYS/PACK) ×3 IMPLANT

## 2020-08-02 NOTE — Op Note (Signed)
PROCEDURES: 1. Laparotomy with Extensive lysis of adhesions taking at least 90 minutes of total operative time, reduction of internal hernia   Pre-operative Diagnosis:SBO  Post-operative Diagnosis: same  Surgeon: Merri Ray Markiesha Delia   Assistants:  Laqueta Due [facility] required due to the complexity of the case and for appropriate exposure  Anesthesia: General endotracheal anesthesia  ASA Class: 2   Surgeon: Sterling Big , MD FACS  Anesthesia: Gen. with endotracheal tube   Findings: Internal hernia with massively dilated small bowel up to 11 cms, this was caused by a band Dense and extensive adhesions from the small bowel to lateral abdominal wall Dilated bowel with some overall hypoperfusion but no necrosis , perforation or frank ischemia  Estimated Blood Loss: 50cc         Specimens: none          Complications: none                 Condition: stable  Procedure Details  The patient was seen again in the Holding Room. The benefits, complications, treatment options, and expected outcomes were discussed with the patient. The risks of bleeding, infection, recurrence of symptoms, failure to resolve symptoms,  bowel injury, any of which could require further surgery were reviewed with the patient.   The patient was taken to Operating Room, identified as [facility] and the procedure verified.  A Time Out was held and the above information confirmed.  Prior to the induction of general anesthesia, antibiotic prophylaxis was administered. VTE prophylaxis was in place. General endotracheal anesthesia was then administered and tolerated well. After the induction, the abdomen was prepped with Chloraprep and draped in the sterile fashion. The patient was positioned in the supine position.  Ileostomy covered with tegaderm, Midline laparotomy performed and fascia elevated, we entered the abdominal cavity sharply with a knife. No evidence of bowel injuries were encountered. Severely dilated  SB encounter, We visualized an internal hernia caused by an adhesive band and divided it with scissors. Due to the severe dilation of the bowel I placed an NGT via end ileostomy and milk the distal bowel. We obtained 2 lts of enteric content. Anesthesia also evacuated 2 lts of gastric fluid. Extensive lysis of adhesions performed to restore appropriate anatomy of the bowel. Inspection of the bowel revealeed to evidence of injuries and no evidence of ischemia or necrosis.   We confirmed the location of the NGT. Liposomal marcaine was used for analgesia. Abd cavity irrigated.  Sepra film placed and fascia closed using small bite technique with 0 PDS suturae. Due to some contamination for ileostomy I decided not to close skin and placed a wet to dry. Ileostomy appliance placed. No complications.        Sterling Big, MD, FACS

## 2020-08-02 NOTE — Consult Note (Signed)
PHARMACY - TOTAL PARENTERAL NUTRITION CONSULT NOTE   Indication: Small bowel obstruction  Patient Measurements: Height: 5\' 10"  (177.8 cm) Weight: 64 kg (141 lb 1.5 oz) IBW/kg (Calculated) : 73 TPN AdjBW (KG): 65.5 Body mass index is 20.24 kg/m. Usual Weight: 64 kg  Assessment: Thomas Rivas is a 24 y.o. 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 Patient with poor appetite and oral intake for the past two days and is at moderate refeeding risk.   Glucose / Insulin: 135-166 mg/dL Electrolytes: WNL Renal: SCr  0.78 LFTs / TGs:          AST/ALT 16/10         TG 106 > 239 Prealbumin / albumin: albumin 3.0 Intake / Output; MIVF:                          -- NS @55  mL/hr                         --10/28- 1330 mL via ileostomy 10/28 0701 - 10/29 0700 In: 3353.9 [I.V.:3353.9] Out: 2600 [Urine:800; Emesis/NG output: 1800]   GI Imaging: are available for review 10/24-28/2021 Surgeries / Procedures: no surgeries; Gastrografin challenge 10/29  Central access: PICC line placed 08/01/20 TPN start date: 07/2819/21  Nutritional Goals (per RD recommendation on 10/27): kCal: 2100-2400kcal/day, Protein: 110-120g/day, Fluid: 2.0-2.3L/day Goal TPN rate is 90 mL/hr (provides ~110 g of protein and ~ 2400 kcals per day)  Current Nutrition:  NPO  Plan:   Increase TPN to goal rate of 90 mL/hr at 1800  Protein 114.48 g  Dextrose 16.5%; 356.4 g  Lipids 30.54%; 34 g/L; 73.4 g  Electrolytes in TPN: 50 mEq/L of Na, 50 mEq/L of K, 5 mEq/L of Ca, 5 mEq/L of Mg, and 15 mmol/L of Phos. Cl:Ac ratio 1:1  Add standard MVI and trace elements to TPN  Continue Sensitive q6h SSI and adjust as needed   Reduce MIVF to 10 mL/hr at 1800 - keep total fluid rate at 100 ml/hr as discussed with surgery PA  TPN labs for  new start: BMP, Mg, Phosphorus for the first 3 days - f/u in AM  Monitor TPN labs on Mon/Thurs: CMP, Mg, Phosphorus, albumin, TG, hepatic panel, albumin    08/2819, PharmD, BCPS Clinical Pharmacist 08/02/2020 9:38 AM

## 2020-08-02 NOTE — Progress Notes (Signed)
Patient seen and examined this morning.  Persistent bowel obstruction.  High NG tube output almost 2 L.  Gastrografin challenge with persistent bowel obstruction.  Clinically feculent fluid in the NG tube and only sweat in the ileostomy suggestive of a complete obstruction.  Discussed with the patient in detail.  We have waited at least 5 days and this obstruction has not resolved.  Although he is very hesitant my recommendation is to proceed to the operating room before he gets sicker.  Cussed with the patient in detail about my thought process.  We talked about the proposed surgery.  Risks, benefits and possible complications including but not limited to: Bleeding, infection bowel injuries chronic pain and bowel obstruction recurrences.  As I stated we have given a very good time of medical management and he is still not resolving now with worsening BUN dehydration I do think that it is in his best interest to proceed with exploratory surgery.

## 2020-08-02 NOTE — Anesthesia Procedure Notes (Signed)
Procedure Name: Intubation Date/Time: 08/02/2020 3:05 PM Performed by: Karoline Caldwell, CRNA Pre-anesthesia Checklist: Patient identified, Patient being monitored, Timeout performed, Emergency Drugs available and Suction available Patient Re-evaluated:Patient Re-evaluated prior to induction Oxygen Delivery Method: Circle system utilized Preoxygenation: Pre-oxygenation with 100% oxygen Induction Type: IV induction and Rapid sequence Laryngoscope Size: 3 and McGraph Grade View: Grade I Tube type: Oral Tube size: 7.0 mm Number of attempts: 1 Airway Equipment and Method: Stylet and Video-laryngoscopy Placement Confirmation: ETT inserted through vocal cords under direct vision,  positive ETCO2 and breath sounds checked- equal and bilateral Secured at: 21 cm Tube secured with: Tape Dental Injury: Teeth and Oropharynx as per pre-operative assessment

## 2020-08-02 NOTE — Anesthesia Postprocedure Evaluation (Signed)
Anesthesia Post Note  Patient: Thomas Rivas  Procedure(s) Performed: EXPLORATORY LAPAROTOMY (N/A )  Patient location during evaluation: PACU Anesthesia Type: General Level of consciousness: awake and alert Pain management: pain level controlled Vital Signs Assessment: post-procedure vital signs reviewed and stable Respiratory status: spontaneous breathing, nonlabored ventilation, respiratory function stable and patient connected to nasal cannula oxygen Cardiovascular status: blood pressure returned to baseline, stable and tachycardic Postop Assessment: no apparent nausea or vomiting Anesthetic complications: no Comments: Patient still in sinus tachycardia; was sinus tachy 130s preoperatively. Has been fluid resuscitated with around 5L of total IV fluids. Otherwise hemodynamically stable, appropriate for PACU discharge to floor.   No complications documented.   Last Vitals:  Vitals:   08/02/20 1833 08/02/20 1848  BP: 127/71 118/81  Pulse: (!) 135 (!) 146  Resp: 14 19  Temp:    SpO2: 97% 99%    Last Pain:  Vitals:   08/02/20 1848  TempSrc:   PainSc: Asleep                 Corinda Gubler

## 2020-08-02 NOTE — Progress Notes (Signed)
PROGRESS NOTE    Thomas Rivas [facility]  XTK:[id number] DOB: [birth year] DOA: 07/28/2020 PCP: Pcp, No    Brief Narrative:  Thomas Salt Murchisonis a 24 y.o.malewith medical history significant for PossibleHirschsprung disease (not proven with biopsy) status post colectomy and ileostomy,who is admitted to [facility] on10/24/2021as transfer from [facility] ED for further evaluation management of small bowel obstruction after presenting from home to the latter facility complaining of abdominal pain.  10/27-Pt with dark high output bilious liquid coming from NGT. Plan picc for 10/28 for TPN  10/28-PICC placed. Pt sinus tachy.   10/19- refused propanolol per nsg. Plan for exp. Lap today. NGT with high outpt  Consultants:   surgery  Procedures: NGT, PICC  Antimicrobials:       Subjective: Has no new complaints.  Quiet.  Denies shortness of breath or chest pain  Objective: Vitals:   08/02/20 0500 08/02/20 0725 08/02/20 0725 08/02/20 1111  BP:   (!) 149/109 (!) 140/110  Pulse:   (!) 123 (!) 132  Resp:   (!) 22 (!) 22  Temp:  97.6 F (36.4 C)  (!) 97.1 F (36.2 C)  TempSrc:  Oral  Tympanic  SpO2:   99% 95%  Weight: 60 kg     Height:        Intake/Output Summary (Last 24 hours) at 08/02/2020 1218 Last data filed at 08/02/2020 1001 Gross per 24 hour  Intake 3316.22 ml  Output 5530 ml  Net -2213.78 ml   Filed Weights   07/29/20 2041 07/30/20 0500 08/02/20 0500  Weight: 65.5 kg 64 kg 60 kg    Examination:  Quiet today, NAD  NG tube with bilious liquid  CTA no wheeze rales rhonchi's  Tacky, regular S1-S2 no murmurs Decreased bowel sounds appears tense mildly distended No edema Neuro grossly intact, alert oriented x3   Data Reviewed: I have personally reviewed following labs and imaging studies  CBC: Recent Labs  Lab 07/28/20 0821 07/28/20 2106 07/29/20 0556 07/30/20 2137 07/31/20 0609 08/01/20 0537 08/02/20 0429   WBC 14.4*   < > 6.5 2.4* 2.6* 7.3 7.0  NEUTROABS 12.6*  --   --   --   --   --   --   HGB 14.7   < > 13.0 13.7 13.4 13.7 13.9  HCT 47.7   < > 42.2 44.0 43.0 44.6 44.5  MCV 72.8*   < > 74.0* 73.9* 73.8* 73.8* 73.9*  PLT 347   < > 337 299 334 345 328   < > = values in this interval not displayed.   Basic Metabolic Panel: Recent Labs  Lab 07/28/20 2106 07/28/20 2106 07/29/20 0556 07/30/20 0444 07/31/20 0609 08/01/20 0537 08/02/20 0429  NA 139   < > 138 143 141 143 145  K 3.7   < > 3.7 3.7 3.8 3.9 4.1  CL 104   < > 104 104 102 102 104  CO2 26   < > 25 28 27 29 28   GLUCOSE 115*   < > 103* 114* 157* 140* 174*  BUN 13   < > 11 11 17  26* 32*  CREATININE 0.88   < > 0.77 0.85 0.89 0.76 0.78  CALCIUM 8.8*   < > 8.8* 9.3 8.8* 8.6* 8.7*  MG 2.0  --  2.0 2.1  --  2.2 2.3  PHOS  --   --   --   --   --  3.0 2.7   < > =  values in this interval not displayed.   GFR: Estimated Creatinine Clearance: 119.8 mL/min (by C-G formula based on SCr of 0.78 mg/dL). Liver Function Tests: Recent Labs  Lab 07/28/20 0821 07/29/20 0556 07/31/20 0609 08/01/20 0537 08/02/20 0429  AST 31 15 16 16 16   ALT 16 11 10 11 10   ALKPHOS 118 93 62 52 50  BILITOT 1.1 0.9 0.6 0.5 0.5  PROT 8.6* 7.0 7.1 7.0 6.6  ALBUMIN 4.9 3.8 3.6 3.3* 3.0*   Recent Labs  Lab 07/28/20 0821  LIPASE 18   No results for input(s): AMMONIA in the last 168 hours. Coagulation Profile: Recent Labs  Lab 07/28/20 2106  INR 1.1   Cardiac Enzymes: No results for input(s): CKTOTAL, CKMB, CKMBINDEX, TROPONINI in the last 168 hours. BNP (last 3 results) No results for input(s): PROBNP in the last 8760 hours. HbA1C: No results for input(s): HGBA1C in the last 72 hours. CBG: Recent Labs  Lab 08/01/20 1540 08/01/20 2034 08/02/20 0014 08/02/20 0505 08/02/20 0800  GLUCAP 135* 166* 155* 165* 161*   Lipid Profile: Recent Labs    08/01/20 0537 08/02/20 0429  TRIG 106 239*   Thyroid Function Tests: Recent Labs     07/31/20 0609 07/31/20 2152  TSH 0.101*  --   FREET4  --  0.74  T3FREE  --  1.3*   Anemia Panel: No results for input(s): VITAMINB12, FOLATE, FERRITIN, TIBC, IRON, RETICCTPCT in the last 72 hours. Sepsis Labs: No results for input(s): PROCALCITON, LATICACIDVEN in the last 168 hours.  Recent Results (from the past 240 hour(s))  Respiratory Panel by RT PCR (Flu A&B, Covid) - Nasopharyngeal Swab     Status: None   Collection Time: 07/28/20 10:22 AM   Specimen: Nasopharyngeal Swab  Result Value Ref Range Status   SARS Coronavirus 2 by RT PCR NEGATIVE NEGATIVE Final    Comment: (NOTE) SARS-CoV-2 target nucleic acids are NOT DETECTED.  The SARS-CoV-2 RNA is generally detectable in upper respiratoy specimens during the acute phase of infection. The lowest concentration of SARS-CoV-2 viral copies this assay can detect is 131 copies/mL. A negative result does not preclude SARS-Cov-2 infection and should not be used as the sole basis for treatment or other patient management decisions. A negative result may occur with  improper specimen collection/handling, submission of specimen other than nasopharyngeal swab, presence of viral mutation(s) within the areas targeted by this assay, and inadequate number of viral copies (<131 copies/mL). A negative result must be combined with clinical observations, patient history, and epidemiological information. The expected result is Negative.  Fact Sheet for Patients:  08/02/20  Fact Sheet for Healthcare Providers:  07/30/20  This test is no t yet approved or cleared by the https://www.moore.com/ FDA and  has been authorized for detection and/or diagnosis of SARS-CoV-2 by FDA under an Emergency Use Authorization (EUA). This EUA will remain  in effect (meaning this test can be used) for the duration of the COVID-19 declaration under Section 564(b)(1) of the Act, 21 U.S.C. section  360bbb-3(b)(1), unless the authorization is terminated or revoked sooner.     Influenza A by PCR NEGATIVE NEGATIVE Final   Influenza B by PCR NEGATIVE NEGATIVE Final    Comment: (NOTE) The Xpert Xpress SARS-CoV-2/FLU/RSV assay is intended as an aid in  the diagnosis of influenza from Nasopharyngeal swab specimens and  should not be used as a sole basis for treatment. Nasal washings and  aspirates are unacceptable for Xpert Xpress SARS-CoV-2/FLU/RSV  testing.  Fact Sheet  for Patients: https://www.moore.com/  Fact Sheet for Healthcare Providers: https://www.young.biz/  This test is not yet approved or cleared by the [place] FDA and  has been authorized for detection and/or diagnosis of SARS-CoV-2 by  FDA under an Emergency Use Authorization (EUA). This EUA will remain  in effect (meaning this test can be used) for the duration of the  Covid-19 declaration under Section 564(b)(1) of the Act, 21  U.S.C. section 360bbb-3(b)(1), unless the authorization is  terminated or revoked. Performed at [facility], [address] [place], [place] [address]          Radiology Studies: DG Abd 2 Views  Result Date: 08/02/2020 CLINICAL DATA:  Small-bowel obstruction. EXAM: ABDOMEN - 2 VIEW COMPARISON:  08/01/2020. FINDINGS: NG tube noted with its tip in the stomach. Persistent severely dilated loops of small bowel noted. No interim change from prior exam. No free air noted. Ostomy noted over the right lower quadrant. Contrast again noted in the rectum thoracolumbar spine scoliosis. IMPRESSION: NG tube noted with its tip in the stomach. Persistent severely dilated loops of small bowel noted consistent with small bowel obstruction. No interim change from prior exam. Electronically Signed   By: Maisie Fus  Register   On: 08/02/2020 07:17   DG Abd 2 Views  Result Date: 08/01/2020 CLINICAL DATA:  Small-bowel obstruction, abdominal distension,  patient reports feeling less distended and less tight, slightly better EXAM: ABDOMEN - 2 VIEW COMPARISON:  07/31/2020 FINDINGS: Nasogastric tube coiled in proximal stomach, stomach decompressed. Marked distention of small bowel loops throughout the upper and mid abdomen consistent with high-grade small bowel obstruction, unchanged. Small amount of gas and contrast in rectum. Otherwise no colonic gas identified. No definite bowel wall thickening or free air. Osseous structures unremarkable. Visualized lung bases clear. IMPRESSION: Persistent high-grade small bowel obstruction. Electronically Signed   By: Ulyses Southward M.D.   On: 08/01/2020 08:09   DG Abd Portable 1V-Small Bowel Obstruction Protocol-initial, 8 hr delay  Result Date: 08/01/2020 CLINICAL DATA:  Small-bowel obstruction, 8 hour delay film. EXAM: PORTABLE ABDOMEN - 1 VIEW COMPARISON:  August 01, 2020 FINDINGS: Numerous dilated small bowel loops are again seen throughout the abdomen. These are unchanged in caliber when compared to the prior study. Air and radiopaque contrast is again seen within the rectum. A paucity of bowel gas is again noted throughout the large bowel which is not clearly identified. No radio-opaque calculi or other significant radiographic abnormality are seen. IMPRESSION: Findings consistent with persistent small bowel obstruction. Electronically Signed   By: Aram Candela M.D.   On: 08/01/2020 19:20        Scheduled Meds: . [MAR Hold] Chlorhexidine Gluconate Cloth  6 each Topical Daily  . [MAR Hold] enoxaparin (LOVENOX) injection  40 mg Subcutaneous Q24H  . [MAR Hold] insulin aspart  0-6 Units Subcutaneous Q6H  . metoprolol tartrate      . [MAR Hold] propranolol  10 mg Per Tube TID   Continuous Infusions: . sodium chloride 55 mL/hr at 08/02/20 0627  . sodium chloride    . [MAR Hold] cefoTEtan (CEFOTAN) IV    . TPN ADULT (ION) 45 mL/hr at 08/02/20 0627  . TPN ADULT (ION)      Assessment & Plan:    Principal Problem:   SBO (small bowel obstruction) (HCC) Active Problems:   Tobacco use   Abdominal pain   Nausea & vomiting   Leukocytosis   Thomas Salt Murchisonis a 24 y.o.malewith medical history significant for  PossibleHirschsprung disease (not proven with biopsy) status post colectomy and ileostomy,who is [facility] for further evaluation management of small bowel obstruction after presenting from home to the latter facility complaining of abdominal pain.  #Small bowel obstruction -10/26-- NG tube replaced by surgery. Patient follows with Dr. Jamal MaesAnna-he is in the process of getting anal/rectal manometry 10/28-plan for exploratory laparotomy today Status post PEG placement for TPN on 10/28 Gastrografin challenge with persistent bowel obstruction High NG tube output almost 2 L   #Leukocytosis-resolved.   Remains afebrile, WBC 7.0 Continue to monitor   #Chronic tobacco abuse.  On nicotine patch  #sinus tachycardia- Per mom had this issue in [place] in the past when was hospitalized Possibly inappropriate sinus tachy v.s. response to current medical issues and high oupt NGT. 10/28-asymptomatic Continue aggressive IV fluid hydration Refused propanolol and I am unsure how much she is retaining since he has NG tube placed with suction, will start him on labetalol IV with parameters      DVT prophylaxis: Lovenox Code Status: Full Family Communication: none at bedside  Status is: Inpatient  Remains inpatient appropriate because:Inpatient level of care appropriate due to severity of illness   Dispo: The patient is from: Home              Anticipated d/c is to: Home              Anticipated d/c date is: > 3 days              Patient currently is not medically stable to d/c.            LOS: 5 days   Time spent: 45 min with >50% on coc    Lynn ItoSahar Makenleigh Crownover, MD [facility] Pager [phone]  If 7PM-7AM, please contact night-coverage [email or web address] Password [email or web address] 08/02/2020, 12:18 PM

## 2020-08-02 NOTE — Progress Notes (Signed)
[facility] SURGICAL PROGRESS NOTE  Hospital Day(s): 5.   Interval History:  Patient seen and examined no acute events or new complaints overnight. Patient reports he is doing better, feels that his distension has improved No nausea or emesis He remains without leukocytosis, WBC 7.0K Renal function remains relatively normal, BUN slightly elevated at 32, sCr - 0.78; UO - 500 ccs No significant electrolyte derangements  NGT output in the last 24 hours recorded at 1.9L He underwent gastrografin challenge however I am unable to appreciate any contrast on KUBs He did have overnight increase in ileostomy function, now with 1.4L out in last 24 hours  Vital signs in last 24 hours: [min-max] current  Temp:  [97.6 F (36.4 C)-98.8 F (37.1 C)] 98.1 F (36.7 C) (10/29 0357) Pulse Rate:  [108-138] 125 (10/29 0357) Resp:  [14-20] 20 (10/29 0357) BP: (136-157)/(101-113) 148/101 (10/29 0357) SpO2:  [95 %-97 %] 95 % (10/29 0357) Weight:  [60 kg] 60 kg (10/29 0500)     Height: 5\' 10"  (177.8 cm) Weight: 60 kg BMI (Calculated): 18.98   Intake/Output last 2 shifts:  10/28 0701 - 10/29 0700 In: 3316.2 [I.V.:3316.2] Out: 3880 [Urine:500; Emesis/NG output:1950; Stool:1430]   Physical Exam:  Constitutional: alert, cooperative and no distress  HENT: normocephalic without obvious abnormality,NGT in place output appears less feculent  Eyes: PERRL, EOM's grossly intact and symmetric  Respiratory: breathing non-labored at rest  Cardiovascular: tachycardic and sinus rhythm  Gastrointestinal:Soft, distension improved some, still tympanic in upper abdomen, no rebound/guarding, ileostomy in LLQ, bag was just changed at time of my evaluation Musculoskeletal: no edema or wounds, motor and sensation grossly intact, NT  Labs:  CBC Latest Ref Rng & Units 08/02/2020 08/01/2020 07/31/2020  WBC 4.0 - 10.5 K/uL 7.0 7.3 2.6(L)  Hemoglobin 13.0 - 17.0 g/dL 08/02/2020 27.2 53.6  Hematocrit 39 - 52 %  44.5 44.6 43.0  Platelets 150 - 400 K/uL 328 345 334   CMP Latest Ref Rng & Units 08/02/2020 08/01/2020 07/31/2020  Glucose 70 - 99 mg/dL 08/02/2020) 034(V) 425(Z)  BUN 6 - 20 mg/dL 563(O) 75(I) 17  Creatinine 0.61 - 1.24 mg/dL 43(P 2.95 1.88  Sodium 135 - 145 mmol/L 145 143 141  Potassium 3.5 - 5.1 mmol/L 4.1 3.9 3.8  Chloride 98 - 111 mmol/L 104 102 102  CO2 22 - 32 mmol/L 28 29 27   Calcium 8.9 - 10.3 mg/dL 4.16) ) 6.0(Y)  Total Protein 6.5 - 8.1 g/dL 6.6 7.0 7.1  Total Bilirubin 0.3 - 1.2 mg/dL 0.5 0.5 0.6  Alkaline Phos 38 - 126 U/L 50 52 62  AST 15 - 41 U/L 16 16 16   ALT 0 - 44 U/L 10 11 10      Imaging studies:  KUB (08/02/2020) personally reviewed showing persistently dilated loops of bowel in the upper abdomen, no contrast seen, and radiologist report reviewed:  IMPRESSION: NG tube noted with its tip in the stomach. Persistent severely dilated loops of small bowel noted consistent with small bowel obstruction. No interim change from prior exam.   KUB (08/01/2020 - 8 hr delay) personally reviewed showing persistent dilated loops in the upper abdomen, I do not appreciate any contrast on this study, and radiologist report reviewed below:  IMPRESSION: Findings consistent with persistent small bowel obstruction.   Assessment/Plan: (ICD-10's: K10.609) 24 y.o. 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 However, he NGT output has remained high and KUB still concerning for markedly dilated  loops of small bowel. He may have decompressed distal to his obstruction, but this is just a theory. He is tentatively post-ed for exploratory laparotomy this afternoon. I will defer definitive decision to go to the OR to Dr [name] pending his evaluation this morning.    - Advance TPN to goal rate  - ContinueNGTdecompression; LIS; monitor and record output - NPO + IVF resuscitation - Monitor abdominal examination; on-going ileostomy function - Pain control prn (minimize narcotics); antiemetics prn - Mobilization encourage - Further management per primary service  All of the above findings and recommendations were discussed with the patient, and the medical team, and all of patient's questions were answered to his expressed satisfaction.  Face-to-face time spent with the patient and care providers was 25 minutes, with more than 50% of the time spent counseling, educating, and coordinating care of the patient  -- Lynden Oxford, PA-C  [facility] 08/02/2020, 7:16 AM [phone] M-F: 7am - 4pm

## 2020-08-02 NOTE — Transfer of Care (Signed)
Immediate Anesthesia Transfer of Care Note  Patient: Thomas Rivas  Procedure(s) Performed: EXPLORATORY LAPAROTOMY (N/A )  Patient Location: PACU  Anesthesia Type:General  Level of Consciousness: awake, alert  and sedated  Airway & Oxygen Therapy: Patient Spontanous Breathing and Patient connected to face mask oxygen  Post-op Assessment: Report given to RN and Post -op Vital signs reviewed and stable  Post vital signs: Reviewed and stable  Last Vitals:  Vitals Value Taken Time  BP    Temp    Pulse 133 08/02/20 1802  Resp    SpO2 99 % 08/02/20 1802  Vitals shown include unvalidated device data.  Last Pain:  Vitals:   08/02/20 1111  TempSrc: Tympanic  PainSc:       Patients Stated Pain Goal: 0 (08/01/20 2037)  Complications: No complications documented.

## 2020-08-02 NOTE — Anesthesia Preprocedure Evaluation (Signed)
Anesthesia Evaluation  Patient identified by MRN, date of birth, ID band Patient awake    Reviewed: Allergy & Precautions, H&P , NPO status , Patient's Chart, lab work & pertinent test results  History of Anesthesia Complications Negative for: history of anesthetic complications  Airway Mallampati: II  TM Distance: >3 FB Neck ROM: full    Dental  (+) Teeth Intact   Pulmonary neg pulmonary ROS, neg sleep apnea, neg COPD, Current Smoker,    breath sounds clear to auscultation       Cardiovascular (-) angina(-) Past MI and (-) Cardiac Stents negative cardio ROS  (-) dysrhythmias  Rhythm:regular Rate:Tachycardia     Neuro/Psych negative neurological ROS  negative psych ROS   GI/Hepatic Neg liver ROS, H/o colectomy, now with SBO likely from adhesions. NG with copious output, ileostomy with less output. Failed medical management. Presenting for laparotomy.   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  Past Surgical History: 12/21/2019: COLON SURGERY     Comment:  abdominal colectomy w/ileostomy 12/25/2019: EXPLORATORY LAPAROTOMY 12/31/2019: GASTROJEJUNOSTOMY  BMI    Body Mass Index: 18.98 kg/m      Reproductive/Obstetrics negative OB ROS                             Anesthesia Physical Anesthesia Plan  ASA: III  Anesthesia Plan: General ETT and Rapid Sequence   Post-op Pain Management:    Induction:   PONV Risk Score and Plan: Ondansetron, Dexamethasone and Treatment may vary due to age or medical condition  Airway Management Planned:   Additional Equipment:   Intra-op Plan:   Post-operative Plan:   Informed  Consent: I have reviewed the patients History and Physical, chart, labs and discussed the procedure including the risks, benefits and alternatives for the proposed anesthesia with the patient or authorized representative who has indicated his/her understanding and acceptance.     Dental Advisory Given  Plan Discussed with: Anesthesiologist, CRNA and Surgeon  Anesthesia Plan Comments:         Anesthesia Quick Evaluation

## 2020-08-03 ENCOUNTER — Encounter: Payer: Self-pay | Admitting: Surgery

## 2020-08-03 DIAGNOSIS — R000 Tachycardia, unspecified: Secondary | ICD-10-CM

## 2020-08-03 DIAGNOSIS — K56609 Unspecified intestinal obstruction, unspecified as to partial versus complete obstruction: Secondary | ICD-10-CM | POA: Diagnosis not present

## 2020-08-03 LAB — GLUCOSE, CAPILLARY
Glucose-Capillary: 239 mg/dL — ABNORMAL HIGH (ref 70–99)
Glucose-Capillary: 269 mg/dL — ABNORMAL HIGH (ref 70–99)
Glucose-Capillary: 208 mg/dL — ABNORMAL HIGH (ref 70–99)

## 2020-08-03 LAB — BASIC METABOLIC PANEL
Anion gap: 7 (ref 5–15)
BUN: 35 mg/dL — ABNORMAL HIGH (ref 6–20)
CO2: 27 mmol/L (ref 22–32)
Calcium: 7.1 mg/dL — ABNORMAL LOW (ref 8.9–10.3)
Chloride: 110 mmol/L (ref 98–111)
Creatinine, Ser: 1.11 mg/dL (ref 0.61–1.24)
GFR, Estimated: >60 mL/min (ref >60)
Glucose, Bld: 250 mg/dL — ABNORMAL HIGH (ref 70–99)
Potassium: 4.5 mmol/L (ref 3.5–5.1)
Sodium: 144 mmol/L (ref 135–145)

## 2020-08-03 LAB — PHOSPHORUS: Phosphorus: 2.7 mg/dL (ref 2.5–4.6)

## 2020-08-03 LAB — FIBRIN DERIVATIVES D-DIMER (ARMC ONLY): Fibrin derivatives D-dimer (ARMC): 5611.04 ng/mL (FEU) — ABNORMAL HIGH (ref 0.00–499.00)

## 2020-08-03 LAB — MAGNESIUM: Magnesium: 2 mg/dL (ref 1.7–2.4)

## 2020-08-03 MED ORDER — METOPROLOL TARTRATE 5 MG/5ML IV SOLN: 2.5000 mg | Freq: Four times a day (QID) | INTRAVENOUS | Status: DC | PRN

## 2020-08-03 MED ORDER — PROPRANOLOL HCL 10 MG PO TABS
10.0000 mg | ORAL_TABLET | Freq: Three times a day (TID) | ORAL | Status: DC
  Administered 2020-08-03: 10 mg via ORAL
  Filled 2020-08-03 (×2): qty 1

## 2020-08-03 MED ORDER — PROPRANOLOL HCL 10 MG PO TABS
10.0000 mg | ORAL_TABLET | Freq: Three times a day (TID) | ORAL | Status: DC
  Filled 2020-08-03: qty 1

## 2020-08-03 MED ORDER — TRAVASOL 10 % IV SOLN
INTRAVENOUS | Status: AC
  Filled 2020-08-03: qty 1144.8

## 2020-08-03 NOTE — Progress Notes (Signed)
SURGICAL PROGRESS NOTE   Hospital Day(s): 6.   Post op day(s): 1 Day Post-Op.   Interval History: Patient seen and examined, no acute events or new complaints overnight. Patient reports feeling very thirsty.  Otherwise denies abdominal pain.  No pain radiation.  No alleviating aggravating factors.  Patient without fever.  Patient with 400 watery brownish output from the ileostomy.  Vital signs in last 24 hours: [min-max] current  Temp:  [97.1 F (36.2 C)-99.1 F (37.3 C)] 99 F (37.2 C) (10/30 0827) Pulse Rate:  [132-156] 143 (10/30 0827) Resp:  [13-24] 18 (10/30 0827) BP: (105-140)/(65-110) 119/80 (10/30 0827) SpO2:  [95 %-100 %] 98 % (10/30 0827) Weight:  [61.1 kg] 61.1 kg (10/30 0432)     Height: 5\' 10"  (177.8 cm) Weight: 61.1 kg BMI (Calculated): 19.33   Physical Exam:  Constitutional: alert, cooperative and no distress  Respiratory: breathing non-labored at rest  Cardiovascular: regular rate and sinus rhythm  Gastrointestinal: soft, non-tender, and non-distended.  Ileostomy pink and patent.  Wound covered with wet-to-dry.  Labs:  CBC Latest Ref Rng & Units 08/02/2020 08/01/2020 07/31/2020  WBC 4.0 - 10.5 K/uL 7.0 7.3 2.6(L)  Hemoglobin 13.0 - 17.0 g/dL 08/02/2020 22.9 79.8  Hematocrit 39 - 52 % 44.5 44.6 43.0  Platelets 150 - 400 K/uL 328 345 334   CMP Latest Ref Rng & Units 08/03/2020 08/02/2020 08/01/2020  Glucose 70 - 99 mg/dL 08/03/2020) 194(R) 740(C)  BUN 6 - 20 mg/dL 144(Y) 18(H) 63(J)  Creatinine 0.61 - 1.24 mg/dL 49(F 0.26 3.78  Sodium 135 - 145 mmol/L 144 145 143  Potassium 3.5 - 5.1 mmol/L 4.5 4.1 3.9  Chloride 98 - 111 mmol/L 110 104 102  CO2 22 - 32 mmol/L 27 28 29   Calcium 8.9 - 10.3 mg/dL 7.1(L) 8.7(L) 8.6(L)  Total Protein 6.5 - 8.1 g/dL - 6.6 7.0  Total Bilirubin 0.3 - 1.2 mg/dL - 0.5 0.5  Alkaline Phos 38 - 126 U/L - 50 52  AST 15 - 41 U/L - 16 16  ALT 0 - 44 U/L - 10 11    Imaging studies: No new pertinent imaging studies   Assessment/Plan:  24 y.o.  male with small bowel surgeon 1 Day Post-Op s/p exploratory laparotomy with extensive lysis of adhesions.  Patient recovering slowly.  No clinical deterioration.  Patient heart rate currently at 143.  This has been at this rate even before the surgery.  I increase his IV fluids yesterday.  He has good urine output this morning.  I am also giving pain medications.  Otherwise patient with adequate blood pressure and no fever.  Will order local care of the wound with wet-to-dry dressing.  We will continue with DVT prophylaxis.  Encourage the patient to ambulate.  We will let the patient have some ice chips.  If he continue output through the ileostomy may consider clamping the NGT.  The surgery was significantly difficult due to the amount of adhesions and the dilation of the intestine so I will expect postoperative ileus and want to take out the NGT to soon that it will need to be reinserted if the ileus recurs.  , MD

## 2020-08-03 NOTE — Consult Note (Signed)
PHARMACY - TOTAL PARENTERAL NUTRITION CONSULT NOTE   Indication: Small bowel obstruction  Patient Measurements: Height: 5\' 10"  (177.8 cm) Weight: 64 kg (141 lb 1.5 oz) IBW/kg (Calculated) : 73 TPN AdjBW (KG): 65.5 Body mass index is 20.24 kg/m. Usual Weight: 64 kg  Assessment: [name] is a 23 y.o. 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 Patient with poor appetite and oral intake and is at moderate refeeding risk. Glucose / Insulin: 161 - 250 mg/dL (2 units SSI required) Electrolytes: WNL Renal: SCr  < 1, stable LFTs / TGs:          AST/ALT WNL         TG 106 > 239 Prealbumin / albumin: albumin 3.0 Intake / Output; MIVF:  NS @ 150 mL/hr GI Imaging:  10/25 X-ray Abd: High-grade small bowel obstruction  10/26 X-ray Abd: Dilated large and small bowel loops unchanged 10/27 X-ray Abd: Multiple loops of dilated proximal mid small bowel in a pattern felt to be indicative of small bowel obstruction 10/28 X-ray Abd: persistent small bowel obstruction 10/29 X-ray Abd: Persistent severely dilated loops of small bowel noted consistent with small bowel obstruction Surgeries / Procedures: 1 Day Post-Op s/p exploratory laparotomy with extensive lysis of adhesions  Central access: PICC line placed 08/01/20 TPN start date: 07/2819/21  Nutritional Goals (per RD recommendation on 10/27): kCal: 2100-2400kcal/day, Protein: 110-120g/day, Fluid: 2.0-2.3L/day Goal TPN rate is 90 mL/hr (provides ~110 g of protein and ~ 2400 kcals per day)  Current Nutrition:  NPO  Plan:   Continue TPN at goal rate of 90 mL/hr (total volume 2260 mL)  Protein 114.48 g  Dextrose 16.5%; 356.4 g  Lipids 30.54%; 34 g/L; 73.4 g  Electrolytes in TPN: 50 mEq/L of Na, 50 mEq/L of K, 5 mEq/L of Ca, 5 mEq/L of Mg, and 15 mmol/L  of Phos. Cl:Ac ratio 1:1  Add standard MVI and trace elements to TPN  Continue Sensitive q6h SSI and adjust as needed   Monitor TPN labs on Mon/Thurs: CMP, Mg, Phosphorus, albumin, TG, hepatic panel, albumin    11/27, PharmD, BCPS Clinical Pharmacist 08/03/2020 9:51 AM

## 2020-08-03 NOTE — Plan of Care (Signed)
Continue to follow current plan of care Problem: Education: Goal: Knowledge of General Education information will improve Description: Including pain rating scale, medication(s)/side effects and non-pharmacologic comfort measures Outcome: Progressing

## 2020-08-03 NOTE — Progress Notes (Signed)
PROGRESS NOTE    Thomas Rivas  KKX:[id number] DOB: 08/18/1996 DOA: 07/28/2020 PCP: Pcp, No    Brief Narrative:  Thomas Salt Murchisonis a 24 y.o.malewith medical history significant for PossibleHirschsprung disease (not proven with biopsy) status post colectomy and ileostomy,who is admitted to [facility] on10/24/2021as transfer from [facility] ED for further evaluation management of small bowel obstruction after presenting from home to the latter facility complaining of abdominal pain.  10/27-Pt with dark high output bilious liquid coming from NGT. Plan picc for 10/28 for TPN  10/28-PICC placed. Pt sinus tachy.   10/19- refused propanolol per nsg. Plan for exp. Lap today. NGT with high outpt 10/30- pt complaining of thirst, would like to drink something. Sinus tachy .   Consultants:   surgery  Procedures: NGT, PICC  Antimicrobials:       Subjective: C/o being thirsty. Wants some juice. No other complaints  Objective: Vitals:   08/03/20 0011 08/03/20 0432 08/03/20 0827 08/03/20 1159  BP:  110/78 119/80 129/72  Pulse: (!) 137 (!) 144 (!) 143 (!) 147  Resp:  20 18 17   Temp:  98.7 F (37.1 C) 99 F (37.2 C) 99 F (37.2 C)  TempSrc:  Oral Oral Oral  SpO2:  98% 98% 99%  Weight:  61.1 kg    Height:        Intake/Output Summary (Last 24 hours) at 08/03/2020 1414 Last data filed at 08/03/2020 0849 Gross per 24 hour  Intake 7062.67 ml  Output 3500 ml  Net 3562.67 ml   Filed Weights   07/30/20 0500 08/02/20 0500 08/03/20 0432  Weight: 64 kg 60 kg 61.1 kg    Examination:  Nad, calm cta no r/w/r Regular tachycardic, s1/s2 no murmurs Mid line dressing in place. Ostomy in place decrease bs No edema Aaxox3, grossly intact   Data Reviewed: I have personally reviewed following labs and imaging studies  CBC: Recent Labs  Lab 07/28/20 0821 07/28/20 2106 07/29/20 0556 07/30/20 2137 07/31/20 0609 08/01/20 0537  08/02/20 0429  WBC 14.4*   < > 6.5 2.4* 2.6* 7.3 7.0  NEUTROABS 12.6*  --   --   --   --   --   --   HGB 14.7   < > 13.0 13.7 13.4 13.7 13.9  HCT 47.7   < > 42.2 44.0 43.0 44.6 44.5  MCV 72.8*   < > 74.0* 73.9* 73.8* 73.8* 73.9*  PLT 347   < > 337 299 334 345 328   < > = values in this interval not displayed.   Basic Metabolic Panel: Recent Labs  Lab 07/29/20 0556 07/29/20 0556 07/30/20 0444 07/31/20 0609 08/01/20 0537 08/02/20 0429 08/03/20 0504  NA 138   < > 143 141 143 145 144  K 3.7   < > 3.7 3.8 3.9 4.1 4.5  CL 104   < > 104 102 102 104 110  CO2 25   < > 28 27 29 28 27   GLUCOSE 103*   < > 114* 157* 140* 174* 250*  BUN 11   < > 11 17 26* 32* 35*  CREATININE 0.77   < > 0.85 0.89 0.76 0.78 1.11  CALCIUM 8.8*   < > 9.3 8.8* 8.6* 8.7* 7.1*  MG 2.0  --  2.1  --  2.2 2.3 2.0  PHOS  --   --   --   --  3.0 2.7 2.7   < > = values in this  interval not displayed.   GFR: Estimated Creatinine Clearance: 87.9 mL/min (by C-G formula based on SCr of 1.11 mg/dL). Liver Function Tests: Recent Labs  Lab 07/28/20 0821 07/29/20 0556 07/31/20 0609 08/01/20 0537 08/02/20 0429  AST 31 15 16 16 16   ALT 16 11 10 11 10   ALKPHOS 118 93 62 52 50  BILITOT 1.1 0.9 0.6 0.5 0.5  PROT 8.6* 7.0 7.1 7.0 6.6  ALBUMIN 4.9 3.8 3.6 3.3* 3.0*   Recent Labs  Lab 07/28/20 0821  LIPASE 18   No results for input(s): AMMONIA in the last 168 hours. Coagulation Profile: Recent Labs  Lab 07/28/20 2106  INR 1.1   Cardiac Enzymes: No results for input(s): CKTOTAL, CKMB, CKMBINDEX, TROPONINI in the last 168 hours. BNP (last 3 results) No results for input(s): PROBNP in the last 8760 hours. HbA1C: No results for input(s): HGBA1C in the last 72 hours. CBG: Recent Labs  Lab 08/02/20 0800 08/02/20 1811 08/02/20 2355 08/03/20 0640 08/03/20 1200  GLUCAP 161* 152* 206* 239* 269*   Lipid Profile: Recent Labs    08/01/20 0537 08/02/20 0429  TRIG 106 239*   Thyroid Function Tests: Recent  Labs    07/31/20 2152  FREET4 0.74  T3FREE 1.3*   Anemia Panel: No results for input(s): VITAMINB12, FOLATE, FERRITIN, TIBC, IRON, RETICCTPCT in the last 72 hours. Sepsis Labs: No results for input(s): PROCALCITON, LATICACIDVEN in the last 168 hours.  Recent Results (from the past 240 hour(s))  Respiratory Panel by RT PCR (Flu A&B, Covid) - Nasopharyngeal Swab     Status: None   Collection Time: 07/28/20 10:22 AM   Specimen: Nasopharyngeal Swab  Result Value Ref Range Status   SARS Coronavirus 2 by RT PCR NEGATIVE NEGATIVE Final    Comment: (NOTE) SARS-CoV-2 target nucleic acids are NOT DETECTED.  The SARS-CoV-2 RNA is generally detectable in upper respiratoy specimens during the acute phase of infection. The lowest concentration of SARS-CoV-2 viral copies this assay can detect is 131 copies/mL. A negative result does not preclude SARS-Cov-2 infection and should not be used as the sole basis for treatment or other patient management decisions. A negative result may occur with  improper specimen collection/handling, submission of specimen other than nasopharyngeal swab, presence of viral mutation(s) within the areas targeted by this assay, and inadequate number of viral copies (<131 copies/mL). A negative result must be combined with clinical observations, patient history, and epidemiological information. The expected result is Negative.  Fact Sheet for Patients:  https://www.moore.com/https://www.fda.gov/media/142436/download  Fact Sheet for Healthcare Providers:  https://www.young.biz/https://www.fda.gov/media/142435/download  This test is no t yet approved or cleared by the [place] FDA and  has been authorized for detection and/or diagnosis of SARS-CoV-2 by FDA under an Emergency Use Authorization (EUA). This EUA will remain  in effect (meaning this test can be used) for the duration of the COVID-19 declaration under Section 564(b)(1) of the Act, 21 U.S.C. section 360bbb-3(b)(1), unless the authorization is  terminated or revoked sooner.     Influenza A by PCR NEGATIVE NEGATIVE Final   Influenza B by PCR NEGATIVE NEGATIVE Final    Comment: (NOTE) The Xpert Xpress SARS-CoV-2/FLU/RSV assay is intended as an aid in  the diagnosis of influenza from Nasopharyngeal swab specimens and  should not be used as a sole basis for treatment. Nasal washings and  aspirates are unacceptable for Xpert Xpress SARS-CoV-2/FLU/RSV  testing.  Fact Sheet for Patients: https://www.moore.com/https://www.fda.gov/media/142436/download  Fact Sheet for Healthcare Providers: https://www.young.biz/https://www.fda.gov/media/142435/download  This test is not yet approved  or cleared by the [place] and  has been authorized for detection and/or diagnosis of SARS-CoV-2 by  FDA under an Emergency Use Authorization (EUA). This EUA will remain  in effect (meaning this test can be used) for the duration of the  Covid-19 declaration under Section 564(b)(1) of the Act, 21  U.S.C. section 360bbb-3(b)(1), unless the authorization is  terminated or revoked. Performed at [facility], [address] [place], [place] [address]          Radiology Studies: DG Abd 2 Views  Result Date: 08/02/2020 CLINICAL DATA:  Small-bowel obstruction. EXAM: ABDOMEN - 2 VIEW COMPARISON:  08/01/2020. FINDINGS: NG tube noted with its tip in the stomach. Persistent severely dilated loops of small bowel noted. No interim change from prior exam. No free air noted. Ostomy noted over the right lower quadrant. Contrast again noted in the rectum thoracolumbar spine scoliosis. IMPRESSION: NG tube noted with its tip in the stomach. Persistent severely dilated loops of small bowel noted consistent with small bowel obstruction. No interim change from prior exam. Electronically Signed   By: Maisie Fus  Register   On: 08/02/2020 07:17   DG Abd Portable 1V-Small Bowel Obstruction Protocol-initial, 8 hr delay  Result Date: 08/01/2020 CLINICAL DATA:  Small-bowel obstruction, 8 hour  delay film. EXAM: PORTABLE ABDOMEN - 1 VIEW COMPARISON:  August 01, 2020 FINDINGS: Numerous dilated small bowel loops are again seen throughout the abdomen. These are unchanged in caliber when compared to the prior study. Air and radiopaque contrast is again seen within the rectum. A paucity of bowel gas is again noted throughout the large bowel which is not clearly identified. No radio-opaque calculi or other significant radiographic abnormality are seen. IMPRESSION: Findings consistent with persistent small bowel obstruction. Electronically Signed   By: Aram Candela M.D.   On: 08/01/2020 19:20        Scheduled Meds: . chlorhexidine  15 mL Mouth Rinse BID  . Chlorhexidine Gluconate Cloth  6 each Topical Daily  . enoxaparin (LOVENOX) injection  40 mg Subcutaneous Q24H  . insulin aspart  0-6 Units Subcutaneous Q6H  . mouth rinse  15 mL Mouth Rinse q12n4p  . metoprolol tartrate  2.5 mg Intravenous Q6H   Continuous Infusions: . sodium chloride 150 mL/hr at 08/03/20 1112  . cefoTEtan (CEFOTAN) IV 2 g (08/03/20 0847)  . TPN ADULT (ION) 90 mL/hr at 08/03/20 0400  . TPN ADULT (ION)      Assessment & Plan:   Principal Problem:   SBO (small bowel obstruction) (HCC) Active Problems:   Tobacco use   Abdominal pain   Nausea & vomiting   Leukocytosis   Thomas Hakeem Murchisonis a 24 y.o.malewith medical history significant for PossibleHirschsprung disease (not proven with biopsy) status post colectomy and ileostomy,who is admitted to [facility] on10/24/2021as transfer from [facility] ED for further evaluation management of small bowel obstruction after presenting from home to the latter facility complaining of abdominal pain.  #Small bowel obstruction -10/26-- NG tube replaced by surgery. Patient follows with Dr. Jamal Maes is in the process of getting anal/rectal manometry 10/28-plan for exploratory laparotomy today Status post PEG placement for  TPN on 10/28 Gastrografin challenge with persistent bowel obstruction 10/30-s/p exploratory laparotomy with extensive lysis of adhesions POD#1 Management per primary team.  NGT still in place, per surgery if he continues    #Leukocytosis-resolved.   Remains afebrile, no wbc check today. Will ck cbc in am    #  Chronic tobacco abuse.  Continue nicotine patch  #hyperglycemia-no h/xo DM. Possibly induced due to his current medical condition/stress Will check HA1c Place on RISS, ck fs  #sinus tachycardia- Per mom had this issue in [place] in the past when was hospitalized Possibly inappropriate sinus tachy v.s. response to current medical issues and high oupt NGT. 10/29- asx. Possibly due to pain, fluid imbalance Will increase ivf rate to 125ml/hr Will ck ddimer, ucx, bcx to r/o other cuases.  Had refused propanolol , and with ngt in place , will continue on iv lopressor      DVT prophylaxis: Lovenox Code Status: Full Family Communication: none at bedside  Status is: Inpatient  Remains inpatient appropriate because:Inpatient level of care appropriate due to severity of illness   Dispo: The patient is from: Home              Anticipated d/c is to: Home              Anticipated d/c date is: > 3 days              Patient currently is not medically stable to d/c.            LOS: 6 days   Time spent: 45 min with >50% on coc    Lynn Ito, MD [facility] Pager [phone]  If 7PM-7AM, please contact night-coverage [email or web address] Password [email or web address] 08/03/2020, 2:14 PM

## 2020-08-04 ENCOUNTER — Inpatient Hospital Stay
Admission: AD | Admit: 2020-08-04 | Payer: Medicaid Other | Source: Other Acute Inpatient Hospital | Admitting: Internal Medicine

## 2020-08-04 DIAGNOSIS — E870 Hyperosmolality and hypernatremia: Secondary | ICD-10-CM

## 2020-08-04 DIAGNOSIS — K56609 Unspecified intestinal obstruction, unspecified as to partial versus complete obstruction: Secondary | ICD-10-CM | POA: Diagnosis not present

## 2020-08-04 DIAGNOSIS — Z720 Tobacco use: Secondary | ICD-10-CM | POA: Diagnosis not present

## 2020-08-04 DIAGNOSIS — R000 Tachycardia, unspecified: Secondary | ICD-10-CM | POA: Diagnosis not present

## 2020-08-04 LAB — GLUCOSE, CAPILLARY
Glucose-Capillary: 142 mg/dL — ABNORMAL HIGH (ref 70–99)
Glucose-Capillary: 143 mg/dL — ABNORMAL HIGH (ref 70–99)
Glucose-Capillary: 173 mg/dL — ABNORMAL HIGH (ref 70–99)
Glucose-Capillary: 175 mg/dL — ABNORMAL HIGH (ref 70–99)

## 2020-08-04 LAB — CBC
HCT: 31.1 % — ABNORMAL LOW (ref 39.0–52.0)
Hemoglobin: 9.8 g/dL — ABNORMAL LOW (ref 13.0–17.0)
MCH: 22.7 pg — ABNORMAL LOW (ref 26.0–34.0)
MCHC: 31.5 g/dL (ref 30.0–36.0)
MCV: 72 fL — ABNORMAL LOW (ref 80.0–100.0)
Platelets: 124 10*3/uL — ABNORMAL LOW (ref 150–400)
RBC: 4.32 MIL/uL (ref 4.22–5.81)
RDW: 18.3 % — ABNORMAL HIGH (ref 11.5–15.5)
WBC: 14.1 10*3/uL — ABNORMAL HIGH (ref 4.0–10.5)
nRBC: 0 % (ref 0.0–0.2)

## 2020-08-04 LAB — BASIC METABOLIC PANEL
Anion gap: 8 (ref 5–15)
BUN: 19 mg/dL (ref 6–20)
CO2: 28 mmol/L (ref 22–32)
Calcium: 7.4 mg/dL — ABNORMAL LOW (ref 8.9–10.3)
Chloride: 112 mmol/L — ABNORMAL HIGH (ref 98–111)
Creatinine, Ser: 0.85 mg/dL (ref 0.61–1.24)
GFR, Estimated: >60 mL/min (ref >60)
Glucose, Bld: 210 mg/dL — ABNORMAL HIGH (ref 70–99)
Potassium: 3.8 mmol/L (ref 3.5–5.1)
Sodium: 148 mmol/L — ABNORMAL HIGH (ref 135–145)

## 2020-08-04 LAB — ECHOCARDIOGRAM COMPLETE: Weight: 2162.27 oz

## 2020-08-04 MED ORDER — PROPRANOLOL HCL 10 MG PO TABS
15.0000 mg | ORAL_TABLET | Freq: Three times a day (TID) | ORAL | Status: DC
  Administered 2020-08-04 – 2020-08-09 (×16): 15 mg via ORAL
  Filled 2020-08-04 (×18): qty 1.5

## 2020-08-04 MED ORDER — TRAVASOL 10 % IV SOLN
INTRAVENOUS | Status: AC
  Filled 2020-08-04: qty 1144.8

## 2020-08-04 MED ORDER — PROPRANOLOL HCL 10 MG PO TABS
10.0000 mg | ORAL_TABLET | Freq: Three times a day (TID) | ORAL | Status: DC
  Filled 2020-08-04: qty 1

## 2020-08-04 MED ORDER — SODIUM CHLORIDE 0.45 % IV SOLN: INTRAVENOUS | Status: DC

## 2020-08-04 NOTE — Progress Notes (Signed)
Sepsis screen performed and is negative. Will continue to monitor. 

## 2020-08-04 NOTE — Progress Notes (Signed)
Per Dr. Maia Plan pt's NG tube can be clamped until tomorrow but if patient feels nauseous or vomiting patient may be put back on intermittent suction. MD notified.   Luvenia Starch, RN

## 2020-08-04 NOTE — Progress Notes (Addendum)
[facility]  - [place] [facility]   PATIENT NAME: Thomas Rivas    MR#:  [id number]  DATE OF BIRTH:  [birth year]  SUBJECTIVE:  POST-OP day 2 patient craving for apple juice. He still has significant amount of NG output (consuming Ice chips and cold water)Ileal conduit has output as well. Denies any abdominal pain. Heart rate in the 120s. Patient has known history of chronic tachycardia according to him.  REVIEW OF SYSTEMS:   Review of Systems  Constitutional: Negative for chills, fever and weight loss.  HENT: Negative for ear discharge, ear pain and nosebleeds.   Eyes: Negative for blurred vision, pain and discharge.  Respiratory: Negative for sputum production, shortness of breath, wheezing and stridor.   Cardiovascular: Negative for chest pain, palpitations, orthopnea and PND.  Gastrointestinal: Negative for abdominal pain, diarrhea, nausea and vomiting.  Genitourinary: Negative for frequency and urgency.  Musculoskeletal: Negative for back pain and joint pain.  Neurological: Positive for weakness. Negative for sensory change, speech change and focal weakness.  Psychiatric/Behavioral: Negative for depression and hallucinations. The patient is not nervous/anxious.    Tolerating Diet:ice chips Tolerating PT: ambulatory  DRUG ALLERGIES:  No Known Allergies  VITALS:  Blood pressure 111/74, pulse (!) 118, temperature 98.2 F (36.8 C), temperature source Oral, resp. rate 17, height 5\' 10"  (1.778 m), weight 61.3 kg, SpO2 100 %.  PHYSICAL EXAMINATION:   Physical Exam  GENERAL:  24 y.o.-year-old patient lying in the bed with no acute distress. Thin, HEENT: Head atraumatic, normocephalic. Oropharynx and nasopharynx clear. NG+ NECK:  Supple, no jugular venous distention. No thyroid enlargement, no tenderness.  LUNGS: Normal breath sounds bilaterally, no wheezing, rales, rhonchi. No use of accessory muscles of respiration.  CARDIOVASCULAR: S1, S2 normal. No  murmurs, rubs, or gallops.  ABDOMEN: Soft, nontender, +distended. Few Bowel sounds present. No organomegaly or mass. Midline surgical incision+, ileal conduit+, foley+ EXTREMITIES: No cyanosis, clubbing or edema b/l.   PICC+ NEUROLOGIC: Cranial nerves II through XII are intact. No focal Motor or sensory deficits b/l.   PSYCHIATRIC:  patient is alert and oriented x 3.  SKIN: No obvious rash, lesion, or ulcer.   LABORATORY PANEL:  CBC Recent Labs  Lab 08/04/20 0402  WBC 14.1*  HGB 9.8*  HCT 31.1*  PLT 124*    Chemistries  Recent Labs  Lab 08/02/20 0429 08/02/20 0429 08/03/20 0504 08/03/20 0504 08/04/20 0402  NA 145   < > 144   < > 148*  K 4.1   < > 4.5   < > 3.8  CL 104   < > 110   < > 112*  CO2 28   < > 27   < > 28  GLUCOSE 174*   < > 250*   < > 210*  BUN 32*   < > 35*   < > 19  CREATININE 0.78   < > 1.11   < > 0.85  CALCIUM 8.7*   < > 7.1*   < > 7.4*  MG 2.3   < > 2.0  --   --   AST 16  --   --   --   --   ALT 10  --   --   --   --   ALKPHOS 50  --   --   --   --   BILITOT 0.5  --   --   --   --    < > = values in this interval  not displayed.   Cardiac Enzymes No results for input(s): TROPONINI in the last 168 hours. RADIOLOGY:  No results found. 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   #Small bowel obstruction -10/26--NG tube replaced by surgery. Patient follows with Dr. Jamal Rivas is in the process of getting anal/rectal manometry 10/28- exploratory laparotomy with adhesionlysis -10/28 Status post PICC placement for TPN  -Gastrografin challenge with persistent bowel  obstruction 10/31-s/p exploratory laparotomy with extensive lysis of adhesions POD# 2 Management per primary team. Ok to start CLD per Dr Thomas Rivas -NGT still in place--significant output--cont IVF change to 1/2 NS with increasing sodium   #Leukocytosis-resolved.   Remains afebrile  #Nutrition -started on TPN--w/f refeeding syndrome  #Chronic tobacco abuse. Continue nicotine patch  #hyperglycemia-no h/xo DM. -Possibly induced due to his current medical condition/stressand TPN - on RISS  #sinus tachycardia- Per mom had this issue in [place] in the past when was hospitalized -Possibly inappropriate sinus tachy v.s. response to current medical issues and high oupt NGT. -prn IV metoprolol -Propranolol 15 mg tid  #Critical illness thyroid dz -Low THS, low T3 and normal T4 -cont to monitor  DVT prophylaxis: Lovenox (holding it due to drop in plt count) Code Status: Full Family Communication: none at bedside  Status is: Inpatient  Remains inpatient appropriate because:Inpatient level of care appropriate due to severity of illness   Dispo: The patient is from: Home  Anticipated d/c is to: Home  Anticipated d/c date is: > 3 days  Patient currently is not medically stable to d/c. Pt cont to have post op significant NG output--starts on sips with ice chips, tachycardia.  TOTAL TIME TAKING CARE OF THIS PATIENT: *24* minutes.  >50% time spent on counselling and coordination of care  Note: This dictation was prepared with Dragon dictation along with smaller phrase technology. Any transcriptional errors that result from this process are unintentional.  Thomas Rivas M.D    [facility]   CC: Primary care physician; Pcp, NoPatient ID: Thomas Rivas, male   DOB: 01/26/1996, 24 y.o.   MRN: [id number]

## 2020-08-04 NOTE — Progress Notes (Addendum)
Report received from Thomas Solo, RN. Per her report she placed [name] to LIWS because pt reported feeling nauseous. Pt NGT currently on LIWS. Will continue to monitor.

## 2020-08-04 NOTE — Progress Notes (Signed)
SURGICAL PROGRESS NOTE   Hospital Day(s): 7.   Post op day(s): 2 Days Post-Op.   Interval History: Patient seen and examined, no acute events or new complaints overnight. Patient reports feeling thirsty.  Otherwise denies significant abdominal pain.  He denies any nausea or vomiting.  He report passing of fluid through the ileostomy.  Patient yesterday was noncompliant with instruction.  I discussed with him that I was cannot let him take some ice chips.  During the day he threatened the nurses that if they do not give him a anything to drink he will drink tap water from the sink.  He is NGT output was 1350 mL but I think that mostly came from the ice chips and water that he drank.  She had a significant output through the ileostomy with 1300.  He has already put 600 of ileostomy output this morning.  Vital signs in last 24 hours: [min-max] current  Temp:  [97.6 F (36.4 C)-99.3 F (37.4 C)] 98.2 F (36.8 C) (10/31 0725) Pulse Rate:  [118-147] 118 (10/31 0725) Resp:  [17-20] 17 (10/31 0725) BP: (105-129)/(66-84) 111/74 (10/31 0725) SpO2:  [97 %-100 %] 100 % (10/31 0725) Weight:  [61.3 kg] 61.3 kg (10/31 0341)     Height: 5\' 10"  (177.8 cm) Weight: 61.3 kg BMI (Calculated): 19.39   Physical Exam:  Constitutional: alert, cooperative and no distress  Respiratory: breathing non-labored at rest  Cardiovascular: regular rate and sinus rhythm  Gastrointestinal: soft, non-tender, and non-distended.  Ileostomy pink and patent.  Open midline wound without purulence or necrotic tissue.  Labs:  CBC Latest Ref Rng & Units 08/04/2020 08/02/2020 08/01/2020  WBC 4.0 - 10.5 K/uL 14.1(H) 7.0 7.3  Hemoglobin 13.0 - 17.0 g/dL 08/03/2020) 4.5(Y 09.9  Hematocrit 39 - 52 % 31.1(L) 44.5 44.6  Platelets 150 - 400 K/uL 124(L) 328 345   CMP Latest Ref Rng & Units 08/04/2020 08/03/2020 08/02/2020  Glucose 70 - 99 mg/dL 08/04/2020) 825(K) 539(J)  BUN 6 - 20 mg/dL 19 673(A) 19(F)  Creatinine 0.61 - 1.24 mg/dL 79(K  2.40 9.73  Sodium 135 - 145 mmol/L 148(H) 144 145  Potassium 3.5 - 5.1 mmol/L 3.8 4.5 4.1  Chloride 98 - 111 mmol/L 112(H) 110 104  CO2 22 - 32 mmol/L 28 27 28   Calcium 8.9 - 10.3 mg/dL 7.4(L) 7.1(L) 8.7(L)  Total Protein 6.5 - 8.1 g/dL - - 6.6  Total Bilirubin 0.3 - 1.2 mg/dL - - 0.5  Alkaline Phos 38 - 126 U/L - - 50  AST 15 - 41 U/L - - 16  ALT 0 - 44 U/L - - 10    Imaging studies: No new pertinent imaging studies   Assessment/Plan:  24 y.o. male with small bowel surgeon 2 Day Post-Op s/p exploratory laparotomy with extensive lysis of adhesions.  Patient recovering adequately.  He is mouth and lips today are looking better than yesterday without any dryness.  He has adequate urine output.  We will continue Foley catheter.  I change the midline wound dressing and apply another wet-to-dry dressing.  The ileostomy is pink and patent with adequate output.  The fact that patient has over at thousand of output through the ileostomy is a good sign.  I will clamp NG and give him a trial of clear liquids.  If he tolerates he my have the NGT removed tomorrow.  This way the patient will not be taking tap water from the sink.  If he develops any nausea, vomiting or abdominal  distention the NGT will be placed back to suction.  I encouraged the patient to ambulate.  Gae Gallop, MD

## 2020-08-04 NOTE — Consult Note (Addendum)
PHARMACY - TOTAL PARENTERAL NUTRITION CONSULT NOTE   Indication: Small bowel obstruction  Patient Measurements: Height: 5\' 10"  (177.8 cm) Weight: 64 kg (141 lb 1.5 oz) IBW/kg (Calculated) : 73 TPN AdjBW (KG): 65.5 Body mass index is 20.24 kg/m. Usual Weight: 64 kg  Assessment: Mr Valvano is a 24 y.o. 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 Patient with poor appetite and oral intake and is at moderate refeeding risk. Glucose / Insulin: 142 - 169 mg/dL (5 units SSI required) Electrolytes: WNL Renal: SCr  < 1, stable LFTs / TGs:          AST/ALT WNL         TG 106 > 239 Prealbumin / albumin: albumin 3.0 Intake / Output; MIVF:  1/2NS @ 150 mL/hr per Dr April GI Imaging:  10/25 X-ray Abd: High-grade small bowel obstruction  10/26 X-ray Abd: Dilated large and small bowel loops unchanged 10/27 X-ray Abd: Multiple loops of dilated proximal mid small bowel in a pattern felt to be indicative of small bowel obstruction 10/28 X-ray Abd: persistent small bowel obstruction 10/29 X-ray Abd: Persistent severely dilated loops of small bowel noted consistent with small bowel obstruction Surgeries / Procedures: 2 Days Post-Op s/p exploratory laparotomy with extensive lysis of adhesions  Central access: PICC line placed 08/01/20 TPN start date: 08/01/20  Nutritional Goals (per RD recommendation on 10/27): kCal: 2100-2400kcal/day, Protein: 110-120g/day, Fluid: 2.0-2.3L/day Goal TPN rate is 90 mL/hr (provides ~110 g of protein and ~ 2400 kcals per day)  Current Nutrition:  NPO  Plan:   Continue TPN at goal rate of 90 mL/hr (total volume 2260 mL)  Protein 114.48 g  Dextrose 16.5%; 356.4 g  Lipids 30.54%; 34 g/L; 73.4 g  Electrolytes in TPN: 50 mEq/L of Na, 50 mEq/L of K, 5 mEq/L of Ca, 5 mEq/L  of Mg, and 15 mmol/L of Phos. Cl:Ac ratio 1:1  Add standard MVI and trace elements to TPN  Continue Sensitive q6h SSI and adjust as needed   Monitor TPN labs on Mon/Thurs: CMP, Mg, Phosphorus, albumin, TG, hepatic panel, albumin    11/27, PharmD, BCPS Clinical Pharmacist 08/04/2020 8:38 AM

## 2020-08-05 ENCOUNTER — Inpatient Hospital Stay
Admission: AD | Admit: 2020-08-05 | Payer: Medicaid Other | Source: Other Acute Inpatient Hospital | Admitting: Internal Medicine

## 2020-08-05 DIAGNOSIS — K56609 Unspecified intestinal obstruction, unspecified as to partial versus complete obstruction: Secondary | ICD-10-CM | POA: Diagnosis not present

## 2020-08-05 LAB — CBC
HCT: 32.6 % — ABNORMAL LOW (ref 39.0–52.0)
Hemoglobin: 10.2 g/dL — ABNORMAL LOW (ref 13.0–17.0)
MCH: 22.6 pg — ABNORMAL LOW (ref 26.0–34.0)
MCHC: 31.3 g/dL (ref 30.0–36.0)
MCV: 72.3 fL — ABNORMAL LOW (ref 80.0–100.0)
Platelets: 137 10*3/uL — ABNORMAL LOW (ref 150–400)
RBC: 4.51 MIL/uL (ref 4.22–5.81)
RDW: 18.5 % — ABNORMAL HIGH (ref 11.5–15.5)
WBC: 12.9 10*3/uL — ABNORMAL HIGH (ref 4.0–10.5)
nRBC: 0 % (ref 0.0–0.2)

## 2020-08-05 LAB — URINE CULTURE: Culture: NO GROWTH

## 2020-08-05 LAB — COMPREHENSIVE METABOLIC PANEL
ALT: 18 U/L (ref 0–44)
AST: 26 U/L (ref 15–41)
Albumin: 2.1 g/dL — ABNORMAL LOW (ref 3.5–5.0)
Alkaline Phosphatase: 56 U/L (ref 38–126)
Anion gap: 10 (ref 5–15)
BUN: 25 mg/dL — ABNORMAL HIGH (ref 6–20)
CO2: 30 mmol/L (ref 22–32)
Calcium: 7.9 mg/dL — ABNORMAL LOW (ref 8.9–10.3)
Chloride: 103 mmol/L (ref 98–111)
Creatinine, Ser: 0.93 mg/dL (ref 0.61–1.24)
GFR, Estimated: >60 mL/min (ref >60)
Glucose, Bld: 232 mg/dL — ABNORMAL HIGH (ref 70–99)
Potassium: 3.7 mmol/L (ref 3.5–5.1)
Sodium: 143 mmol/L (ref 135–145)
Total Bilirubin: 0.6 mg/dL (ref 0.3–1.2)
Total Protein: 6.2 g/dL — ABNORMAL LOW (ref 6.5–8.1)

## 2020-08-05 LAB — TRIGLYCERIDES: Triglycerides: 320 mg/dL — ABNORMAL HIGH (ref <150)

## 2020-08-05 LAB — HEMOGLOBIN A1C
Hgb A1c MFr Bld: 6.1 % — ABNORMAL HIGH (ref 4.8–5.6)
Mean Plasma Glucose: 128 mg/dL

## 2020-08-05 LAB — PHOSPHORUS: Phosphorus: 3 mg/dL (ref 2.5–4.6)

## 2020-08-05 LAB — GLUCOSE, CAPILLARY
Glucose-Capillary: 207 mg/dL — ABNORMAL HIGH (ref 70–99)
Glucose-Capillary: 342 mg/dL — ABNORMAL HIGH (ref 70–99)

## 2020-08-05 LAB — PREALBUMIN: Prealbumin: <5 mg/dL — ABNORMAL LOW (ref 18–38)

## 2020-08-05 LAB — MAGNESIUM: Magnesium: 2.8 mg/dL — ABNORMAL HIGH (ref 1.7–2.4)

## 2020-08-05 MED ORDER — TRAVASOL 10 % IV SOLN
INTRAVENOUS | Status: AC
  Filled 2020-08-05: qty 1144.8

## 2020-08-05 NOTE — Progress Notes (Signed)
Pateint NG tube currently clamped per provider Gillermina Phy, PA.  Luvenia Starch, RN

## 2020-08-05 NOTE — Consult Note (Signed)
PHARMACY - TOTAL PARENTERAL NUTRITION CONSULT NOTE   Indication: Small bowel obstruction  Patient Measurements: Height: 5\' 10"  (177.8 cm) Weight: 64 kg (141 lb 1.5 oz) IBW/kg (Calculated) : 73 TPN AdjBW (KG): 65.5 Body mass index is 20.24 kg/m. Usual Weight: 64 kg  Assessment: Thomas Rivas is a 24 y.o. 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 Patient with poor appetite and oral intake and is at moderate refeeding risk. Glucose / Insulin: 143-207 mg/dL (3 units SSI required) Electrolytes: WNL, except Mag 2.8 Renal: SCr  < 1, stable LFTs / TGs:          AST/ALT WNL         TG 106 > 239 > 320 Prealbumin / albumin: albumin 3.0 > 2.1 Intake / Output; MIVF:  1/2NS @ 150 mL/hr per Dr April GI Imaging:  10/25 X-ray Abd: High-grade small bowel obstruction  10/26 X-ray Abd: Dilated large and small bowel loops unchanged 10/27 X-ray Abd: Multiple loops of dilated proximal mid small bowel in a pattern felt to be indicative of small bowel obstruction 10/28 X-ray Abd: persistent small bowel obstruction 10/29 X-ray Abd: Persistent severely dilated loops of small bowel noted consistent with small bowel obstruction Surgeries / Procedures: 3 Days Post-Op s/p exploratory laparotomy with extensive lysis of adhesions  Central access: PICC line placed 08/01/20 TPN start date: 08/01/20  Nutritional Goals (per RD recommendation on 10/27): kCal: 2100-2400kcal/day, Protein: 110-120g/day, Fluid: 2.0-2.3L/day Goal TPN rate is 90 mL/hr (provides ~110 g of protein and ~ 2400 kcals per day)  Current Nutrition:  Trial of CLD  Plan:   Continue TPN at goal rate of 90 mL/hr (total volume 2260 mL)  Protein 114.48 g  Dextrose 16.5%; 356.4 g  Lipids 30.54%; 34 g/L; 73.4 g  Electrolytes in TPN: 50 mEq/L of Na, 50  mEq/L of K, 5 mEq/L of Ca, 0 mEq/L of Mg, and 15 mmol/L of Phos. Cl:Ac ratio 1:1 -- Magnesium removed from TPN for tonight due to elevated Mag of 2.8  Add standard MVI and trace elements to TPN  Continue Sensitive q6h SSI and adjust as needed   Discussed with Surgery PA, 1/2NS maintenance fluids stopped  Monitor TPN labs on Mon/Thurs: CMP, Mg, Phosphorus, albumin, TG, hepatic panel, albumin -- Will recheck labs in AM.    11/27, PharmD, BCPS Clinical Pharmacist 08/05/2020 8:36 AM

## 2020-08-05 NOTE — Plan of Care (Signed)
Pt Thomas Rivas. Calm and cooperative and able to voice his needs. Large output noted per NGT. NGT currently to LIWS with green drainage and sediments noted. RLQ colostomy with brown liquid and mucus-like drainage noted. Pt receiving IVF and TPN. No nausea nor vomiting noted. Pt drinking water and apple juice. Pt voiding marginal clear amber urine per urinal. On RA. Prn pain meds alternating between Toradol and Dilaudid administered and effective. Use of IS and TCDB exercises education provided. Pt refused to have his abdominal dressing changed. He stated he wants MD to do so. Midline abdominal dressing intact. Vitals stable. Safety measures in place. Will continue to monitor.  Problem: Education: Goal: Knowledge of General Education information will improve Description: Including pain rating scale, medication(s)/side effects and non-pharmacologic comfort measures Outcome: Progressing   Problem: Health Behavior/Discharge Planning: Goal: Ability to manage health-related needs will improve Outcome: Progressing   Problem: Clinical Measurements: Goal: Ability to maintain clinical measurements within normal limits will improve Outcome: Progressing Goal: Will remain free from infection Outcome: Progressing Goal: Diagnostic test results will improve Outcome: Progressing Goal: Respiratory complications will improve Outcome: Progressing Goal: Cardiovascular complication will be avoided Outcome: Progressing   Problem: Activity: Goal: Risk for activity intolerance will decrease Outcome: Progressing   Problem: Nutrition: Goal: Adequate nutrition will be maintained Outcome: Progressing   Problem: Coping: Goal: Level of anxiety will decrease Outcome: Progressing   Problem: Elimination: Goal: Will not experience complications related to bowel motility Outcome: Progressing Goal: Will not experience complications related to urinary retention Outcome: Progressing   Problem: Pain Managment: Goal:  General experience of comfort will improve Outcome: Progressing   Problem: Safety: Goal: Ability to remain free from injury will improve Outcome: Progressing   Problem: Skin Integrity: Goal: Risk for impaired skin integrity will decrease Outcome: Progressing

## 2020-08-05 NOTE — Progress Notes (Signed)
[facility] SURGICAL PROGRESS NOTE  Hospital Day(s): 8.   Post op day(s): 3 Days Post-Op.   Interval History:  Patient seen and examined No acute events or new complaints overnight.  Patient reports he is doing okay this morning, no significant pain.  No fever, chills, nausea, or emesis Reactive leukocytosis from surgery improving; WBC down to 12.9K Renal function remains normal, sCr - 0.93, UO - 1.9L Hypermagnesemia to 2.8, otherwise no significant electrolyte derangements  NGT output remains high at 2.1L; although he notes that he was drinking water and ice chips yesterday NGT has been clamped since 0700 this morning  His ileostomy is functioning, output recorded at 3.0L in last 24 hours   Vital signs in last 24 hours: [min-max] current  Temp:  [98.2 F (36.8 C)-99.3 F (37.4 C)] 98.5 F (36.9 C) (11/01 0301) Pulse Rate:  [104-118] 106 (11/01 0301) Resp:  [17-20] 18 (11/01 0301) BP: (111-124)/(74-87) 124/87 (11/01 0301) SpO2:  [98 %-100 %] 100 % (11/01 0301)     Height: 5\' 10"  (177.8 cm) Weight: 61.3 kg BMI (Calculated): 19.39   Intake/Output last 2 shifts:  10/31 0701 - 11/01 0700 In: 4322.5 [P.O.:960; I.V.:2062.5; NG/GT:1200; IV Piggyback:100] Out: 7085 [Urine:1960; Emesis/NG output:2100; Stool:3025]   Physical Exam:  Constitutional: alert, cooperative and no distress  HEENT: NGT in place  Respiratory: breathing non-labored at rest  Cardiovascular: regular rate and sinus rhythm  Gastrointestinal: Soft, expected incisional soreness, no appreciable distension, no rebound/guarding, ileostomy in the RLQ with gas and stool in bag Integumentary: Midline laparotomy healing via secondary intention, no erythema or purulence, wound bad with almost 100% granulation tissue  Labs:  CBC Latest Ref Rng & Units 08/05/2020 08/04/2020 08/02/2020  WBC 4.0 - 10.5 K/uL 12.9(H) 14.1(H) 7.0  Hemoglobin 13.0 - 17.0 g/dL 10.2(L) 9.8(L) 13.9  Hematocrit 39 - 52 % 32.6(L)  31.1(L) 44.5  Platelets 150 - 400 K/uL 137(L) 124(L) 328   CMP Latest Ref Rng & Units 08/05/2020 08/04/2020 08/03/2020  Glucose 70 - 99 mg/dL 08/05/2020) 175(Z) 025(E)  BUN 6 - 20 mg/dL 527(P) 19 82(U)  Creatinine 0.61 - 1.24 mg/dL 23(N 3.61 4.43  Sodium 135 - 145 mmol/L 143 148(H) 144  Potassium 3.5 - 5.1 mmol/L 3.7 3.8 4.5  Chloride 98 - 111 mmol/L 103 112(H) 110  CO2 22 - 32 mmol/L 30 28 27   Calcium 8.9 - 10.3 mg/dL 7.9(L) 7.4(L) 7.1(L)  Total Protein 6.5 - 8.1 g/dL 6.2(L) - -  Total Bilirubin 0.3 - 1.2 mg/dL 0.6 - -  Alkaline Phos 38 - 126 U/L 56 - -  AST 15 - 41 U/L 26 - -  ALT 0 - 44 U/L 18 - -     Imaging studies: No new pertinent imaging studies   Assessment/Plan: 24 y.o. male with slow improvement and return of bowel function 3 Days Post-Op s/p exploratory laparotomy and lysis of adhesions for small bowel obstruction secondary to internal hernia, complicated by pertinent comorbidities including s/p subtotal colectomy for reportedly Hirschsprung's disease however he does not have a formal diagnosis of this and was pending outpatient work up with GI.   - He would like to keep NGT in place until he is certain that he is tolerating CLD without nausea, emesis, distension.    - Okay to continue trial on CLD; back down to NPO if having symptoms  - Continue TPN at goal rate for now  - Monitor abdominal examination; on-going bowel function  - Pain control prn; antiemetics prn  -  Monitor leukocytosis; improving; likely reactive  - Mobilization encouraged  - DVT prophylaxis    All of the above findings and recommendations were discussed with the patient, and the medical team, and all of patient's questions were answered to his expressed satisfaction.  -- Lynden Oxford, PA-C [facility] 08/05/2020, 7:20 AM [phone] M-F: 7am - 4pm

## 2020-08-05 NOTE — Progress Notes (Addendum)
Per Dr. Sheilah Mins patient NG tube residual checked. Current output 350 ml. Patient requested to be placed back on suction. No nausea or vomit complaints but states his stomach feels full at times and feels he needs to be back on suction. Patient continued on low intermittent suction. MD notified.   Luvenia Starch, RN

## 2020-08-05 NOTE — Progress Notes (Signed)
NGT currently clamped.

## 2020-08-05 NOTE — Progress Notes (Signed)
PROGRESS NOTE    Thomas Rivas [facility]  ATF:[id number] DOB: Jul 14, 1996 DOA: 07/28/2020 PCP: Pcp, No    Brief Narrative:  Pt sleepy, has no complaints. Did not eat breakfast.      Subjective: No complaints  Objective: Vitals:   08/04/20 2305 08/05/20 0301 08/05/20 0813 08/05/20 1100  BP: 113/80 124/87 120/75 (!) 117/97  Pulse: (!) 108 (!) 106 (!) 114 (!) 113  Resp: 20 18    Temp: 98.6 F (37 C) 98.5 F (36.9 C) 98.2 F (36.8 C) 98.7 F (37.1 C)  TempSrc: Oral Oral Oral Axillary  SpO2: 100% 100%    Weight:      Height:        Intake/Output Summary (Last 24 hours) at 08/05/2020 1247 Last data filed at 08/05/2020 0943 Gross per 24 hour  Intake 4562.54 ml  Output 5980 ml  Net -1417.46 ml   Filed Weights   08/02/20 0500 08/03/20 0432 08/04/20 0341  Weight: 60 kg 61.1 kg 61.3 kg    Examination:  General exam: Appears calm and comfortable  Respiratory system: Clear to auscultation. Respiratory effort normal. NGT in place Cardiovascular system: S1 & S2 heard, mildly tachycardic Gastrointestinal system: Abdomen dressing in place.  Ostomy bag in place Central nervous system: Alert and oriented. No focal neurological deficits. Extremities: no edema Psychiatry:Mood & affect appropriate in current setting.     Data Reviewed: I have personally reviewed following labs and imaging studies  CBC: Recent Labs  Lab 07/31/20 0609 08/01/20 0537 08/02/20 0429 08/04/20 0402 08/05/20 0340  WBC 2.6* 7.3 7.0 14.1* 12.9*  HGB 13.4 13.7 13.9 9.8* 10.2*  HCT 43.0 44.6 44.5 31.1* 32.6*  MCV 73.8* 73.8* 73.9* 72.0* 72.3*  PLT 334 345 328 124* 137*   Basic Metabolic Panel: Recent Labs  Lab 07/30/20 0444 07/31/20 0609 08/01/20 0537 08/02/20 0429 08/03/20 0504 08/04/20 0402 08/05/20 0340  NA 143   < > 143 145 144 148* 143  K 3.7   < > 3.9 4.1 4.5 3.8 3.7  CL 104   < > 102 104 110 112* 103  CO2 28   < > 29 28 27 28 30   GLUCOSE 114*   < > 140* 174* 250* 210* 232*   BUN 11   < > 26* 32* 35* 19 25*  CREATININE 0.85   < > 0.76 0.78 1.11 0.85 0.93  CALCIUM 9.3   < > 8.6* 8.7* 7.1* 7.4* 7.9*  MG 2.1  --  2.2 2.3 2.0  --  2.8*  PHOS  --   --  3.0 2.7 2.7  --  3.0   < > = values in this interval not displayed.   GFR: Estimated Creatinine Clearance: 105.3 mL/min (by C-G formula based on SCr of 0.93 mg/dL). Liver Function Tests: Recent Labs  Lab 07/31/20 0609 08/01/20 0537 08/02/20 0429 08/05/20 0340  AST 16 16 16 26   ALT 10 11 10 18   ALKPHOS 62 52 50 56  BILITOT 0.6 0.5 0.5 0.6  PROT 7.1 7.0 6.6 6.2*  ALBUMIN 3.6 3.3* 3.0* 2.1*   No results for input(s): LIPASE, AMYLASE in the last 168 hours. No results for input(s): AMMONIA in the last 168 hours. Coagulation Profile: No results for input(s): INR, PROTIME in the last 168 hours. Cardiac Enzymes: No results for input(s): CKTOTAL, CKMB, CKMBINDEX, TROPONINI in the last 168 hours. BNP (last 3 results) No results for input(s): PROBNP in the last 8760 hours. HbA1C: No results for input(s): HGBA1C in the last  72 hours. CBG: Recent Labs  Lab 08/03/20 2359 08/04/20 1252 08/04/20 1728 08/04/20 2332 08/05/20 0526  GLUCAP 142* 143* 173* 175* 207*   Lipid Profile: Recent Labs    08/05/20 0500  TRIG 320*   Thyroid Function Tests: No results for input(s): TSH, T4TOTAL, FREET4, T3FREE, THYROIDAB in the last 72 hours. Anemia Panel: No results for input(s): VITAMINB12, FOLATE, FERRITIN, TIBC, IRON, RETICCTPCT in the last 72 hours. Sepsis Labs: No results for input(s): PROCALCITON, LATICACIDVEN in the last 168 hours.  Recent Results (from the past 240 hour(s))  Respiratory Panel by RT PCR (Flu A&B, Covid) - Nasopharyngeal Swab     Status: None   Collection Time: 07/28/20 10:22 AM   Specimen: Nasopharyngeal Swab  Result Value Ref Range Status   SARS Coronavirus 2 by RT PCR NEGATIVE NEGATIVE Final    Comment: (NOTE) SARS-CoV-2 target nucleic acids are NOT DETECTED.  The SARS-CoV-2 RNA is  generally detectable in upper respiratoy specimens during the acute phase of infection. The lowest concentration of SARS-CoV-2 viral copies this assay can detect is 131 copies/mL. A negative result does not preclude SARS-Cov-2 infection and should not be used as the sole basis for treatment or other patient management decisions. A negative result may occur with  improper specimen collection/handling, submission of specimen other than nasopharyngeal swab, presence of viral mutation(s) within the areas targeted by this assay, and inadequate number of viral copies (<131 copies/mL). A negative result must be combined with clinical observations, patient history, and epidemiological information. The expected result is Negative.  Fact Sheet for Patients:  https://www.moore.com/https://www.fda.gov/media/142436/download  Fact Sheet for Healthcare Providers:  https://www.young.biz/https://www.fda.gov/media/142435/download  This test is no t yet approved or cleared by the [place] FDA and  has been authorized for detection and/or diagnosis of SARS-CoV-2 by FDA under an Emergency Use Authorization (EUA). This EUA will remain  in effect (meaning this test can be used) for the duration of the COVID-19 declaration under Section 564(b)(1) of the Act, 21 U.S.C. section 360bbb-3(b)(1), unless the authorization is terminated or revoked sooner.     Influenza A by PCR NEGATIVE NEGATIVE Final   Influenza B by PCR NEGATIVE NEGATIVE Final    Comment: (NOTE) The Xpert Xpress SARS-CoV-2/FLU/RSV assay is intended as an aid in  the diagnosis of influenza from Nasopharyngeal swab specimens and  should not be used as a sole basis for treatment. Nasal washings and  aspirates are unacceptable for Xpert Xpress SARS-CoV-2/FLU/RSV  testing.  Fact Sheet for Patients: https://www.moore.com/https://www.fda.gov/media/142436/download  Fact Sheet for Healthcare Providers: https://www.young.biz/https://www.fda.gov/media/142435/download  This test is not yet approved or cleared by the [place] FDA and  has been authorized for detection and/or diagnosis of SARS-CoV-2 by  FDA under an Emergency Use Authorization (EUA). This EUA will remain  in effect (meaning this test can be used) for the duration of the  Covid-19 declaration under Section 564(b)(1) of the Act, 21  U.S.C. section 360bbb-3(b)(1), unless the authorization is  terminated or revoked. Performed at [facility] [place], [address] [place], [place] [address]   Culture, blood (single) w Reflex to ID Panel     Status: None (Preliminary result)   Collection Time: 08/03/20  2:44 PM   Specimen: BLOOD  Result Value Ref Range Status   Specimen Description BLOOD BLOOD LEFT HAND  Final   Special Requests   Final    BOTTLES DRAWN AEROBIC AND ANAEROBIC Blood Culture results may not be optimal due to an inadequate volume of blood received  in culture bottles   Culture   Final    NO GROWTH 2 DAYS Performed at [facility], [address] [place], [place] [address]    Report Status PENDING  Incomplete  Culture, blood (single) w Reflex to ID Panel     Status: None (Preliminary result)   Collection Time: 08/03/20  3:27 PM   Specimen: BLOOD  Result Value Ref Range Status   Specimen Description   Final    BLOOD Blood Culture results may not be optimal due to an inadequate volume of blood received in culture bottles   Special Requests   Final    BOTTLES DRAWN AEROBIC AND ANAEROBIC BLOOD LEFT HAND   Culture   Final    NO GROWTH 2 DAYS Performed at [facility], [address] [place], [place] [address]    Report Status PENDING  Incomplete  Urine Culture     Status: None   Collection Time: 08/03/20  6:41 PM   Specimen: Urine, Random  Result Value Ref Range Status   Specimen Description   Final    URINE, RANDOM Performed at [facility], [address] [place], [place] [address]    Special Requests   Final    NONE Performed at [facility], [address],  [place], [place] [address]    Culture   Final    NO GROWTH Performed at [facility], [address] [place], [place] [address]    Report Status 08/05/2020 FINAL  Final         Radiology Studies: No results found.      Scheduled Meds: . chlorhexidine  15 mL Mouth Rinse BID  . Chlorhexidine Gluconate Cloth  6 each Topical Daily  . insulin aspart  0-6 Units Subcutaneous Q6H  . mouth rinse  15 mL Mouth Rinse q12n4p  . propranolol  15 mg Oral TID   Continuous Infusions: . TPN ADULT (ION) 90 mL/hr at 08/04/20 1826  . 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   #Small bowel obstruction -10/26--NG tube replaced by surgery. Patient follows with Dr. Jamal Maes is in the process of getting anal/rectal manometry 10/28- exploratory laparotomy with adhesionlysis -10/28 Status post PICC placement for TPN  -Gastrografin challenge with persistent bowel obstruction 10/31-s/p exploratory laparotomy with extensive lysis of adhesions POD# 3 Management per primary team.  Okay to start clear liquid diet per surgery however patient is not taking any p.o. intake this morning  Mobilization encouraged    #Leukocytosis-decreased Remains afebrile    #Nutrition Started on TPN Monitor for refeeding syndrome  #Chronic tobacco abuse. Continue nicotine patch  #hyperglycemia-no h/xo  DM. -Possibly induced due to his current medical condition/stressand TPN - on RISS  #sinus tachycardia- Per mom had this issue in [place] in the past when was hospitalized -Possibly inappropriate sinus tachy v.s. response to current medical issues and high oupt NGT. -Improving  Continue propanolol 15  mg 3 times daily and IV metoprolol as needed   #Critical illness thyroid dz -Low THS, low T3 and normal T4 -cont to monitor          LOS: 8 days   Time spent: 35 min with >50% on coc    Lynn Ito, MD [facility] Pager [phone]  If 7PM-7AM, please contact night-coverage [email or web address] Password [email or web address] 08/05/2020, 12:47 PM

## 2020-08-05 NOTE — Progress Notes (Signed)
Sepsis screen performed and is negative. Will continue to monitor. 

## 2020-08-06 ENCOUNTER — Inpatient Hospital Stay: Payer: Medicaid Other

## 2020-08-06 DIAGNOSIS — K56609 Unspecified intestinal obstruction, unspecified as to partial versus complete obstruction: Secondary | ICD-10-CM | POA: Diagnosis not present

## 2020-08-06 LAB — BASIC METABOLIC PANEL
Anion gap: 15 (ref 5–15)
BUN: 47 mg/dL — ABNORMAL HIGH (ref 6–20)
CO2: 28 mmol/L (ref 22–32)
Calcium: 8.8 mg/dL — ABNORMAL LOW (ref 8.9–10.3)
Chloride: 93 mmol/L — ABNORMAL LOW (ref 98–111)
Creatinine, Ser: 1.38 mg/dL — ABNORMAL HIGH (ref 0.61–1.24)
GFR, Estimated: >60 mL/min (ref >60)
Glucose, Bld: 359 mg/dL — ABNORMAL HIGH (ref 70–99)
Potassium: 4.9 mmol/L (ref 3.5–5.1)
Sodium: 136 mmol/L (ref 135–145)

## 2020-08-06 LAB — GLUCOSE, CAPILLARY
Glucose-Capillary: 388 mg/dL — ABNORMAL HIGH (ref 70–99)
Glucose-Capillary: 359 mg/dL — ABNORMAL HIGH (ref 70–99)
Glucose-Capillary: 329 mg/dL — ABNORMAL HIGH (ref 70–99)
Glucose-Capillary: 311 mg/dL — ABNORMAL HIGH (ref 70–99)
Glucose-Capillary: 220 mg/dL — ABNORMAL HIGH (ref 70–99)
Glucose-Capillary: 226 mg/dL — ABNORMAL HIGH (ref 70–99)

## 2020-08-06 LAB — MAGNESIUM: Magnesium: 2.9 mg/dL — ABNORMAL HIGH (ref 1.7–2.4)

## 2020-08-06 LAB — TRIGLYCERIDES: Triglycerides: 450 mg/dL — ABNORMAL HIGH (ref <150)

## 2020-08-06 LAB — PHOSPHORUS: Phosphorus: 4.6 mg/dL (ref 2.5–4.6)

## 2020-08-06 MED ORDER — SODIUM CHLORIDE 0.9 % IV SOLN: INTRAVENOUS | Status: AC

## 2020-08-06 MED ORDER — INSULIN GLARGINE 100 UNIT/ML ~~LOC~~ SOLN
5.0000 [IU] | Freq: Every day | SUBCUTANEOUS | Status: DC
  Filled 2020-08-06: qty 0.05

## 2020-08-06 MED ORDER — TRAVASOL 10 % IV SOLN
INTRAVENOUS | Status: AC
  Filled 2020-08-06: qty 1197

## 2020-08-06 MED ORDER — INSULIN ASPART 100 UNIT/ML ~~LOC~~ SOLN
0.0000 [IU] | SUBCUTANEOUS | Status: DC
  Administered 2020-08-06 (×2): 7 [IU] via SUBCUTANEOUS
  Administered 2020-08-06: 9 [IU] via SUBCUTANEOUS
  Administered 2020-08-06 (×2): 3 [IU] via SUBCUTANEOUS
  Administered 2020-08-07 (×4): 2 [IU] via SUBCUTANEOUS
  Administered 2020-08-08: 5 [IU] via SUBCUTANEOUS
  Administered 2020-08-08 (×2): 3 [IU] via SUBCUTANEOUS
  Administered 2020-08-08 (×3): 2 [IU] via SUBCUTANEOUS
  Administered 2020-08-08: 3 [IU] via SUBCUTANEOUS
  Administered 2020-08-09: 2 [IU] via SUBCUTANEOUS
  Administered 2020-08-09 – 2020-08-16 (×20): 1 [IU] via SUBCUTANEOUS
  Filled 2020-08-06 (×36): qty 1

## 2020-08-06 NOTE — Plan of Care (Addendum)
Pt Thomas Rivas. Calm and cooperative and able to voice his needs. Large output noted per NGT. NGT to LIWS starting at 2300 for a couple of hours per pt complaint of nausea and wanting to throw up. 800 ml noted then NGT remained clamped since 1am. Green drainage and sediments noted.  RLQ colostomy with brown liquid and mucus-like drainage noted. Pt receiving TPN at 89ml/hr. Pt drank minimal amount of water and ice overnight. Pt voiding marginal clear amber urine per urinal. On RA. Prn pain meds alternating between Toradol and Dilaudid administered and effective. Use of IS and TCDB exercises education provided. Pt refused to have his abdominal dressing changed. Pt would like MD to do dressing changes. Midline abdominal dressing intact. Blood sugar levels are running high. They are covered with insulin sliding scale per protocol. Pt's mother would like MD to update her on POC.    Pt sat on the chair for a few minutes. Was able to clean himself up.  Pt remains on [place] all night. Prn pain meds and scheduled Metoprolol administered per MD orders.  HR remains less than 130. Safety measures in place. Will continue to monitor.  Problem: Education: Goal: Knowledge of General Education information will improve Description: Including pain rating scale, medication(s)/side effects and non-pharmacologic comfort measures Outcome: Progressing   Problem: Health Behavior/Discharge Planning: Goal: Ability to manage health-related needs will improve Outcome: Progressing   Problem: Clinical Measurements: Goal: Ability to maintain clinical measurements within normal limits will improve Outcome: Progressing Goal: Will remain free from infection Outcome: Progressing Goal: Diagnostic test results will improve Outcome: Progressing Goal: Respiratory complications will improve Outcome: Progressing Goal: Cardiovascular complication will be avoided Outcome: Progressing   Problem: Activity: Goal: Risk for activity  intolerance will decrease Outcome: Progressing   Problem: Nutrition: Goal: Adequate nutrition will be maintained Outcome: Progressing   Problem: Coping: Goal: Level of anxiety will decrease Outcome: Progressing   Problem: Elimination: Goal: Will not experience complications related to bowel motility Outcome: Progressing Goal: Will not experience complications related to urinary retention Outcome: Progressing   Problem: Pain Managment: Goal: General experience of comfort will improve Outcome: Progressing   Problem: Safety: Goal: Ability to remain free from injury will improve Outcome: Progressing   Problem: Skin Integrity: Goal: Risk for impaired skin integrity will decrease Outcome: Progressing

## 2020-08-06 NOTE — Progress Notes (Signed)
Sepsis screen performed and is negative. Will continue to monitor. 

## 2020-08-06 NOTE — Progress Notes (Signed)
Inpatient Diabetes Program Recommendations  AACE/ADA: New Consensus Statement on Inpatient Glycemic Control   Target Ranges:  Prepandial:   less than 140 mg/dL      Peak postprandial:   less than 180 mg/dL (1-2 hours)      Critically ill patients:  140 - 180 mg/dL   Results for [name], [name] (MRN [id number]) as of 08/06/2020 06:56  Ref. Range 08/05/2020 05:26 08/05/2020 23:28 08/06/2020 06:23  Glucose-Capillary Latest Ref Range: 70 - 99 mg/dL 412 (H) 878 (H) 676 (H)   Review of Glycemic Control  Current orders for Inpatient glycemic control: Novolog 0-6 units Q6H; TPN @ 90 ml/hr  Inpatient Diabetes Program Recommendations:    Insulin: Please consider increasing Novolog correction to 0-9 units and change frequency to Q4H.  Insulin in TPN: Please consider adding Regular 20 units to TPN if continued at current rate.  Thanks, Orlando Penner, RN, MSN, CDE Diabetes Coordinator Inpatient Diabetes Program [phone] (Team Pager from 8am to 5pm)

## 2020-08-06 NOTE — Progress Notes (Signed)
   08/06/20 0804  Assess: MEWS Score  Temp 98.4 F (36.9 C)  BP (!) 126/93  Pulse Rate (!) 130  Resp 13  SpO2 97 %  O2 Device Room Air  Assess: MEWS Score  MEWS Temp 0  MEWS Systolic 0  MEWS Pulse 3  MEWS RR 1  MEWS LOC 0  MEWS Score 4  MEWS Score Color Red  Assess: if the MEWS score is Yellow or Red  Were vital signs taken at a resting state? Yes  Focused Assessment No change from prior assessment  Early Detection of Sepsis Score *See Row Information* Low  MEWS guidelines implemented *See Row Information* Yes  Treat  MEWS Interventions Administered scheduled meds/treatments;Administered prn meds/treatments  Pain Scale 0-10  Pain Score 7  Pain Type Surgical pain  Pain Location Abdomen  Pain Orientation Mid  Take Vital Signs  Increase Vital Sign Frequency  Red: Q 1hr X 4 then Q 4hr X 4, if remains red, continue Q 4hrs  Escalate  MEWS: Escalate Red: discuss with charge nurse/RN and provider, consider discussing with RRT  Notify: Charge Nurse/RN  Name of Charge Nurse/RN Notified Sherlyn Lick RN  Date Charge Nurse/RN Notified 08/06/20  Time Charge Nurse/RN Notified 0820  Notify: Provider  Provider Name/Title Lynden Oxford, PA  Date Provider Notified 08/06/20  Time Provider Notified [phone]  Notification Type Face-to-face  Notification Reason Change in status  Response No new orders  Date of Provider Response 08/06/20  Time of Provider Response 0840  Document  Patient Outcome Stabilized after interventions  Progress note created (see row info) Yes  Pain medication and scheduled beta blocker given. Will continue to monitor

## 2020-08-06 NOTE — Progress Notes (Addendum)
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 Underwent exploratory lapratomy . Has been sinus tachycardic- per pt had this before. hospitalist are consultants for medical mx of tachycardia.      Subjective: No complaints  Objective: Vitals:   08/05/20 2002 08/05/20 2323 08/06/20 0343 08/06/20 0804  BP: (!) 128/93 (!) 118/92 (!) 115/101 (!) 126/93  Pulse: (!) 112 (!) 128 (!) 111 (!) 130  Resp: 18 18 18 13   Temp: 98.3 F (36.8 C) 98.9 F (37.2 C) 98.2 F (36.8 C) 98.4 F (36.9 C)  TempSrc:  Oral  Oral  SpO2: 100% 98% 97% 97%  Weight:      Height:        Intake/Output Summary (Last 24 hours) at 08/06/2020 0811 Last data filed at 08/06/2020 0600 Gross per 24 hour  Intake 320 ml  Output 5040 ml  Net -4720 ml   Filed Weights   08/02/20 0500 08/03/20 0432 08/04/20 0341  Weight: 60 kg 61.1 kg 61.3 kg    Examination: Calm, nad ngt in place cta no r/w/r Reg, tachy, s1/s2 Dressing in place, ostomy in rlq . Mild distention No edema Mood and affect appropriate in current setting  Data Reviewed: I have personally reviewed following labs and imaging studies  CBC: Recent Labs  Lab 07/31/20 0609 08/01/20 0537 08/02/20 0429 08/04/20 0402 08/05/20 0340  WBC 2.6* 7.3 7.0 14.1* 12.9*  HGB 13.4 13.7 13.9 9.8* 10.2*  HCT 43.0 44.6 44.5 31.1* 32.6*  MCV 73.8* 73.8* 73.9* 72.0* 72.3*  PLT 334 345 328 124* 137*   Basic Metabolic Panel: Recent Labs  Lab 08/01/20 0537 08/01/20 0537  08/02/20 0429 08/03/20 0504 08/04/20 0402 08/05/20 0340 08/06/20 0422  NA 143   < > 145 144 148* 143 136  K 3.9   < > 4.1 4.5 3.8 3.7 4.9  CL 102   < > 104 110 112* 103 93*  CO2 29   < > 28 27 28 30 28   GLUCOSE 140*   < > 174* 250* 210* 232* 359*  BUN 26*   < > 32* 35* 19 25* 47*  CREATININE 0.76   < > 0.78 1.11 0.85 0.93 1.38*  CALCIUM 8.6*   < > 8.7* 7.1* 7.4* 7.9* 8.8*  MG 2.2  --  2.3 2.0  --  2.8* 2.9*  PHOS 3.0  --  2.7 2.7  --  3.0 4.6   < > = values in this interval not displayed.   GFR: Estimated Creatinine Clearance: 70.9 mL/min (A) (by C-G formula based on SCr of 1.38 mg/dL (H)). Liver Function Tests: Recent Labs  Lab 07/31/20 0609 08/01/20 0537 08/02/20 0429 08/05/20 0340  AST 16 16 16 26   ALT 10 11 10 18   ALKPHOS 62 52 50 56  BILITOT 0.6 0.5 0.5 0.6  PROT 7.1 7.0 6.6 6.2*  ALBUMIN 3.6 3.3* 3.0* 2.1*   No results for input(s): LIPASE, AMYLASE in the last 168 hours. No results for input(s): AMMONIA in the last 168 hours. Coagulation Profile: No results for input(s): INR, PROTIME in the last 168  hours. Cardiac Enzymes: No results for input(s): CKTOTAL, CKMB, CKMBINDEX, TROPONINI in the last 168 hours. BNP (last 3 results) No results for input(s): PROBNP in the last 8760 hours. HbA1C: Recent Labs    08/05/20 0349  HGBA1C 6.1*   CBG: Recent Labs  Lab 08/04/20 1728 08/04/20 2332 08/05/20 0526 08/05/20 2328 08/06/20 0623  GLUCAP 173* 175* 207* 342* 388*   Lipid Profile: Recent Labs    08/05/20 0500 08/06/20 0422  TRIG 320* 450*   Thyroid Function Tests: No results for input(s): TSH, T4TOTAL, FREET4, T3FREE, THYROIDAB in the last 72 hours. Anemia Panel: No results for input(s): VITAMINB12, FOLATE, FERRITIN, TIBC, IRON, RETICCTPCT in the last 72 hours. Sepsis Labs: No results for input(s): PROCALCITON, LATICACIDVEN in the last 168 hours.  Recent Results (from the past 240 hour(s))  Respiratory Panel by RT PCR (Flu A&B, Covid) -  Nasopharyngeal Swab     Status: None   Collection Time: 07/28/20 10:22 AM   Specimen: Nasopharyngeal Swab  Result Value Ref Range Status   SARS Coronavirus 2 by RT PCR NEGATIVE NEGATIVE Final    Comment: (NOTE) SARS-CoV-2 target nucleic acids are NOT DETECTED.  The SARS-CoV-2 RNA is generally detectable in upper respiratoy specimens during the acute phase of infection. The lowest concentration of SARS-CoV-2 viral copies this assay can detect is 131 copies/mL. A negative result does not preclude SARS-Cov-2 infection and should not be used as the sole basis for treatment or other patient management decisions. A negative result may occur with  improper specimen collection/handling, submission of specimen other than nasopharyngeal swab, presence of viral mutation(s) within the areas targeted by this assay, and inadequate number of viral copies (<131 copies/mL). A negative result must be combined with clinical observations, patient history, and epidemiological information. The expected result is Negative.  Fact Sheet for Patients:  https://www.moore.com/  Fact Sheet for Healthcare Providers:  https://www.young.biz/  This test is no t yet approved or cleared by the [place] FDA and  has been authorized for detection and/or diagnosis of SARS-CoV-2 by FDA under an Emergency Use Authorization (EUA). This EUA will remain  in effect (meaning this test can be used) for the duration of the COVID-19 declaration under Section 564(b)(1) of the Act, 21 U.S.C. section 360bbb-3(b)(1), unless the authorization is terminated or revoked sooner.     Influenza A by PCR NEGATIVE NEGATIVE Final   Influenza B by PCR NEGATIVE NEGATIVE Final    Comment: (NOTE) The Xpert Xpress SARS-CoV-2/FLU/RSV assay is intended as an aid in  the diagnosis of influenza from Nasopharyngeal swab specimens and  should not be used as a sole basis for treatment. Nasal washings and   aspirates are unacceptable for Xpert Xpress SARS-CoV-2/FLU/RSV  testing.  Fact Sheet for Patients: https://www.moore.com/  Fact Sheet for Healthcare Providers: https://www.young.biz/  This test is not yet approved or cleared by the [place] FDA and  has been authorized for detection and/or diagnosis of SARS-CoV-2 by  FDA under an Emergency Use Authorization (EUA). This EUA will remain  in effect (meaning this test can be used) for the duration of the  Covid-19 declaration under Section 564(b)(1) of the Act, 21  U.S.C. section 360bbb-3(b)(1), unless the authorization is  terminated or revoked. Performed at [facility], [address] [place], [place] [address]   Culture, blood (single) w Reflex to ID Panel     Status: None (Preliminary result)   Collection Time: 08/03/20  2:44 PM   Specimen: BLOOD  Result  Value Ref Range Status   Specimen Description BLOOD BLOOD LEFT HAND  Final   Special Requests   Final    BOTTLES DRAWN AEROBIC AND ANAEROBIC Blood Culture results may not be optimal due to an inadequate volume of blood received in culture bottles   Culture   Final    NO GROWTH 3 DAYS Performed at [facility], [address] [place], [place] [address]    Report Status PENDING  Incomplete  Culture, blood (single) w Reflex to ID Panel     Status: None (Preliminary result)   Collection Time: 08/03/20  3:27 PM   Specimen: BLOOD  Result Value Ref Range Status   Specimen Description   Final    BLOOD Blood Culture results may not be optimal due to an inadequate volume of blood received in culture bottles   Special Requests   Final    BOTTLES DRAWN AEROBIC AND ANAEROBIC BLOOD LEFT HAND   Culture   Final    NO GROWTH 3 DAYS Performed at [facility], [address] [place], [place] [address]    Report Status PENDING  Incomplete  Urine Culture     Status: None   Collection Time: 08/03/20  6:41 PM    Specimen: Urine, Random  Result Value Ref Range Status   Specimen Description   Final    URINE, RANDOM Performed at [facility], [address] [place], [place] [address]    Special Requests   Final    NONE Performed at [facility], [address] [place], [place] [address]    Culture   Final    NO GROWTH Performed at [facility], [address] [place], [place], [place] [address]    Report Status 08/05/2020 FINAL  Final         Radiology Studies: DG Abd Portable 1V  Result Date: 08/06/2020 CLINICAL DATA:  Small-bowel obstruction EXAM: PORTABLE ABDOMEN - 1 VIEW COMPARISON:  08/02/2020. FINDINGS: NG tube noted with its tip over the upper portion of the stomach. Advancement of approximately 10 cm should be considered. Persistent severely dilated loops of small bowel noted bowel dilatation up to 6 cm noted. Air is noted within the colon on today's exam. Right hemidiaphragm incompletely visualized. No free air 5. Ostomy noted over the right lower quadrant. Barium noted in the rectum. No free air identified. No acute bony abnormality identified. IMPRESSION: 1. NG tube noted with its tip over the upper portion of the stomach. Advancement of approximately 10 cm should be considered. 2. Persistent severely dilated loops of small bowel again noted. Bowel dilatation up to 6 cm noted. Air noted within the colon on today's exam. No free air identified. Electronically Signed   By: Maisie Fus  Register   On: 08/06/2020 07:50        Scheduled Meds: . chlorhexidine  15 mL Mouth Rinse BID  . Chlorhexidine Gluconate Cloth  6 each Topical Daily  . insulin aspart  0-9 Units Subcutaneous Q4H  . mouth rinse  15 mL Mouth Rinse q12n4p  . propranolol  15 mg Oral TID   Continuous Infusions: . sodium chloride    . 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   #Small bowel obstruction -10/26--NG tube replaced by surgery. Patient follows with Dr. Jamal Maes is in the process of getting anal/rectal manometry 10/28- exploratory laparotomy with adhesionlysis -10/28 Status post PICC placement for TPN  -Gastrografin challenge with persistent bowel obstruction 10/31-s/p exploratory laparotomy with extensive lysis of adhesions POD# 4 Management per primary team.  On clears Continue with NGT and TPN. Consulted nutritionist for TPN adjustments with elevated bg. Mobilization encouraged Serial abdominal examination Agree with resuming ivf as he had a bump in creatinine and more tachy, likely 2/2 hypovolemia from GI loss.     #Leukocytosis- Improving, afebrile. Possibly reactive/stress induced.     #Nutrition Continue with TPN Dietitian consulted Monitor for refeeding syndrome   #Chronic tobacco abuse. On nicotine patch  #hyperglycemia-no h/xo DM. -Hemoglobin A1c 6.1  Blood glucose levels or elevated  Dietician consult for TPN adjustments with elevated bg levels. We will start Lantus 5 units daily  Continue with R ISS  Diabetes educator consult will be placed    #sinus tachycardia- Per mom  had this issue in [place] in the past when was hospitalized Also patient reported to me had this issue before Possible inappropriate sinus tachycardia versus response to current medical issues and high GI output causing hypovolemia Continue  with propanolol and IV metoprolol IV fluid resumed may increase rate if output is more than input   #Critical illness thyroid dz -Low THS, low T3 and normal T4 Will need levels to be rechecked as outpatient once his current medical issues resolved          LOS: 9 days   Time spent: 35 min with >50% on coc    Lynn Ito, MD [facility] Pager [phone]  If 7PM-7AM, please contact night-coverage [email or web address] Password [email or web address] 08/06/2020, 8:11 AM

## 2020-08-06 NOTE — Consult Note (Signed)
PHARMACY - TOTAL PARENTERAL NUTRITION CONSULT NOTE   Indication: Small bowel obstruction  Patient Measurements: Height: 5\' 10"  (177.8 cm) Weight: 64 kg (141 lb 1.5 oz) IBW/kg (Calculated) : 73 TPN AdjBW (KG): 65.5 Body mass index is 20.24 kg/m. Usual Weight: 64 kg  Assessment: Mr Parcell is a 24 y.o. 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 Patient with poor appetite and oral intake and is at moderate refeeding risk. Glucose / Insulin: 342-388 mg/dL (10 units SSI required) - noon and evening CBGs yesterday not recorded? Electrolytes: K 4.9, Phos 4.6, Mag 2.9 Renal: SCr  0.93 > 1.38 LFTs / TGs:          AST/ALT WNL         TG 106 > 239 > 320 > 450 Prealbumin / albumin: albumin 3.0 > 2.1 Intake / Output; MIVF: MIVF fluids stopped on 11/1, NS at 100 ml/hr restarted GI Imaging:  10/25 X-ray Abd: High-grade small bowel obstruction  10/26 X-ray Abd: Dilated large and small bowel loops unchanged 10/27 X-ray Abd: Multiple loops of dilated proximal mid small bowel in a pattern felt to be indicative of small bowel obstruction 10/28 X-ray Abd: persistent small bowel obstruction 10/29 X-ray Abd: Persistent severely dilated loops of small bowel noted consistent with small bowel obstruction Surgeries / Procedures: 3 Days Post-Op s/p exploratory laparotomy with extensive lysis of adhesions  Central access: PICC line placed 08/01/20 TPN start date: 08/01/20  Nutritional Goals (per RD recommendation on 10/27): kCal: 2100-2400kcal/day, Protein: 110-120g/day, Fluid: 2.0-2.3L/day Goal TPN rate is 90 mL/hr (provides ~110 g of protein and ~ 2400 kcals per day)  Current Nutrition:  Trial of CLD started 10/31  Plan:   Recalculated TPN starting at 1800 tonight: TPN at 75 mL/hr (total volume 1900 mL)  Increase  Protein to higher end of goal: 119.7 g  Dextrose 17%; 306 g  Lipids removed from TPN due to TG 450  Electrolytes in TPN: 50 mEq/L of Na, 20 mEq/L of K, 5 mEq/L of Ca, 0 mEq/L of Mg, and 5 mmol/L of Phos as K and Phos are trending up towards upper limit of normal and bump in SCr. Cl:Ac ratio 1:1 -- Magnesium removed from TPN for tonight due to elevated Mag of 2.9  Add standard MVI and trace elements to TPN  Increase SSI to sensitive scale (0-9 units) q4h  Add regular insulin 20 units to TPN bag  MIVF restarted NS at 100 ml/hr per surgery team  Monitor TPN labs on Mon/Thurs: CMP, Mg, Phosphorus,TG -- Will recheck labs in AM.    11/31, PharmD, BCPS Clinical Pharmacist 08/06/2020 7:30 AM

## 2020-08-06 NOTE — Progress Notes (Signed)
[facility] SURGICAL PROGRESS NOTE  Hospital Day(s): 9.   Post op day(s): 4 Days Post-Op.   Interval History:  Patient seen and examined no acute events or new complaints overnight.  Patient reports he is doing okay, abdominal soreness No fever, chills, nausea, emesis He did have a slight bump in renal function, sCr - 1.38, UO - 940 ccs Remains with hypermagnesemia to 2.9 NGT output recorded at 1.6L; he continues to request to be placed on LIS for "relief" but denied any nausea/emesis. NGT has been clamped this morning  Ileostomy with 2.5L out in last 24 hours He had been on CLD yesterday without reported issues  Vital signs in last 24 hours: [min-max] current  Temp:  [98.2 F (36.8 C)-98.9 F (37.2 C)] 98.2 F (36.8 C) (11/02 0343) Pulse Rate:  [97-128] 111 (11/02 0343) Resp:  [18] 18 (11/02 0343) BP: (115-134)/(75-101) 115/101 (11/02 0343) SpO2:  [97 %-100 %] 97 % (11/02 0343)     Height: 5\' 10"  (177.8 cm) Weight: 61.3 kg BMI (Calculated): 19.39   Intake/Output last 2 shifts:  11/01 0701 - 11/02 0700 In: 320 [P.O.:320] Out: 5040 [Urine:940; Emesis/NG output:1600; Stool:2500]   Physical Exam:  Constitutional: alert, cooperative and no distress  HEENT: NGT in place  Respiratory: breathing non-labored at rest  Cardiovascular: regular rate and sinus rhythm  Gastrointestinal: Soft, expected incisional soreness, no appreciable distension, no rebound/guarding, ileostomy in the RLQ with gas and stool in bag Integumentary: Midline laparotomy healing via secondary intention, no erythema or purulence, wound bad with almost 100% granulation tissue  Labs:  CBC Latest Ref Rng & Units 08/05/2020 08/04/2020 08/02/2020  WBC 4.0 - 10.5 K/uL 12.9(H) 14.1(H) 7.0  Hemoglobin 13.0 - 17.0 g/dL 10.2(L) 9.8(L) 13.9  Hematocrit 39 - 52 % 32.6(L) 31.1(L) 44.5  Platelets 150 - 400 K/uL 137(L) 124(L) 328   CMP Latest Ref Rng & Units 08/06/2020 08/05/2020 08/04/2020  Glucose 70 -  99 mg/dL 08/06/2020) 063(K) 160(F)  BUN 6 - 20 mg/dL 093(A) 35(T) 19  Creatinine 0.61 - 1.24 mg/dL 73(U) 2.02(R 4.27  Sodium 135 - 145 mmol/L 136 143 148(H)  Potassium 3.5 - 5.1 mmol/L 4.9 3.7 3.8  Chloride 98 - 111 mmol/L 93(L) 103 112(H)  CO2 22 - 32 mmol/L 28 30 28   Calcium 8.9 - 10.3 mg/dL 0.62) 7.9(L) 7.4(L)  Total Protein 6.5 - 8.1 g/dL - 6.2(L) -  Total Bilirubin 0.3 - 1.2 mg/dL - 0.6 -  Alkaline Phos 38 - 126 U/L - 56 -  AST 15 - 41 U/L - 26 -  ALT 0 - 44 U/L - 18 -     Imaging studies:   KUB (08/06/220) personally reviewed showing persistently dilated loops of small bowel, and radiologist report reviewed below:  IMPRESSION: 1. NG tube noted with its tip over the upper portion of the stomach. Advancement of approximately 10 cm should be considered. 2. Persistent severely dilated loops of small bowel again noted. Bowel dilatation up to 6 cm noted. Air noted within the colon on today's exam. No free air identified.    Assessment/Plan:  24 y.o. male with persistently dilated loops of bowel 4 Days Post-Op s/p exploratory laparotomy and lysis of adhesions for small bowel obstruction secondary to internal hernia, complicated by pertinent comorbidities including s/p subtotal colectomy for reportedly Hirschsprung's disease however he does not have a formal diagnosis of this and was pending outpatient work up with GI.    - we will continue NGT for now. We will  leave NGT clamped for now, he is requesting to be intermittently placed on LIS which is fine. It is difficult to determine the reliability of his measured NGT output. We will also follow with KUBs serially.   - Okay to continue CLD   - I did restart maintenance IVF given his bump in renal function which I suspect is secondary to hypovolemia from GI losses; NS 100 ml/hr  - Continue TPN at goal rate for now             - Monitor abdominal examination; on-going bowel function             - Pain control prn; antiemetics prn              - Monitor leukocytosis; improving; likely reactive             - Mobilization encouraged             - DVT prophylaxis    All of the above findings and recommendations were discussed with the patient, and the medical team, and all of patient's questions were answered to his expressed satisfaction.   -- Lynden Oxford, PA-C [facility] 08/06/2020, 7:40 AM [phone] M-F: 7am - 4pm

## 2020-08-06 NOTE — Progress Notes (Addendum)
Nutrition Follow-up  DOCUMENTATION CODES:   Severe malnutrition in context of acute illness/injury  INTERVENTION:   TPN per pharmacy   NUTRITION DIAGNOSIS:   Severe Malnutrition related to acute illness as evidenced by moderate fat depletion, severe fat depletion, moderate muscle depletion, severe muscle depletion, 6 percent weight loss in 1 month.  GOAL:   Patient will meet greater than or equal to 90% of their needs  MONITOR:   PO intake, Labs, Weight trends, TF tolerance, Skin, I & O's, Other (Comment) (TPN)  REASON FOR ASSESSMENT:   Consult New TPN/TNA  ASSESSMENT:   24 y.o. male with medical history significant for marijuana abuse and possible Hirschsprung's disease with chronic constipation s/p colectomy and end ileostomy in March requiring TPN and G-J tube placement who is admitted with SBO   Met with pt in room today. Pt reports that he is feeling fair today. Pt denies any nausea, vomiting or abdominal pain; pt's main complaints are having a sore throat from the NGT and extreme thirst. Pt has been requesting Gatorade consistently and is drinking large amounts of water and Gatorade. Pt's NGT output has been ~1.6L. Pt reports that he does not like juice or soda. NGT currently clamped but pt requesting intermittent suctioning as he reports that it makes his throat feel better. Pt continues to have high ileostomy output. RD discussed with pt about the importance of adequate nutrition needed to preserve lean muscle and support post op healing. Spoke with pt about the excessive amounts of Gatorade that he has been drinking. Explained how the high osmolality of Gatorade can increase his ostomy output and increase his blood glucoses. Pt reports that he had high ostomy output originally after his ileostomy creation and that he was using a oral rehydration solution of Gatorade, water and salt. Pt with hyperglycemia and hypertriglyceridemia after initiation of TPN. Will plan to decrease  dextrose in TPN by ~50g and add insulin to TPN. Will hold on lipids for tonight and plan to restart once blood sugars are more under control. Will allow pt to have Gatorade Zero. Will plan to start start oral rehydration therapy once TPN discontinued if needed. Of note, pt is lactose intolerant.   Per chart, pt down 9lbs(6%) in < 1 month; this is significant.     Medications reviewed and include: insulin, NaCl _0 /hr  Labs reviewed: K 4.9 wnl, BUN 47(H), creat 1.38(H), P 4.6 wnl, Mg 2.9(H) Pre-albumin- <5(L)- 11/1 Triglycerides- 450(H) Wbc- 12.9(H), Hgb 10.2(L), Hct 32.6(L), MCV 72.3(L), MCH 22.6(L) cbgs- 388, 359, 329 x 24 hrs AIC 6.1(H)- 11/1  NUTRITION - FOCUSED PHYSICAL EXAM:    Most Recent Value  Orbital Region Mild depletion  Upper Arm Region Severe depletion  Thoracic and Lumbar Region Moderate depletion  Buccal Region Mild depletion  Temple Region Mild depletion  Clavicle Bone Region Moderate depletion  Clavicle and Acromion Bone Region Moderate depletion  Scapular Bone Region Unable to assess  Dorsal Hand Mild depletion  Patellar Region Severe depletion  Anterior Thigh Region Severe depletion  Posterior Calf Region Severe depletion  Edema (RD Assessment) None  Hair Reviewed  Eyes Reviewed  Mouth Reviewed  Skin Reviewed  Nails Reviewed     Diet Order:   Diet Order            Diet clear liquid Room service appropriate? Yes; Fluid consistency: Thin  Diet effective now                EDUCATION NEEDS:   Education needs  have been addressed  Skin:  Skin Assessment: Reviewed RN Assessment  Last BM:  11/2- 2.5L via ostomy  Height:   Ht Readings from Last 1 Encounters:  07/29/20 _0  (1.778 m)    Weight:   Wt Readings from Last 1 Encounters:  08/04/20 61.3 kg    Ideal Body Weight:  75.45 kg  BMI:  Body mass index is 19.39 kg/m.  Estimated Nutritional Needs:   Kcal:  2100-2400kcal/day  Protein:  110-120g/day  Fluid:  2.0-2.3L/day  Koleen Distance MS, RD, LDN Please refer to [facility] for RD and/or RD on-call/weekend/after hours pager

## 2020-08-07 ENCOUNTER — Inpatient Hospital Stay: Payer: Medicaid Other

## 2020-08-07 DIAGNOSIS — K56609 Unspecified intestinal obstruction, unspecified as to partial versus complete obstruction: Secondary | ICD-10-CM | POA: Diagnosis not present

## 2020-08-07 DIAGNOSIS — E43 Unspecified severe protein-calorie malnutrition: Secondary | ICD-10-CM | POA: Insufficient documentation

## 2020-08-07 LAB — GLUCOSE, CAPILLARY
Glucose-Capillary: 132 mg/dL — ABNORMAL HIGH (ref 70–99)
Glucose-Capillary: 178 mg/dL — ABNORMAL HIGH (ref 70–99)
Glucose-Capillary: 152 mg/dL — ABNORMAL HIGH (ref 70–99)
Glucose-Capillary: 158 mg/dL — ABNORMAL HIGH (ref 70–99)
Glucose-Capillary: 163 mg/dL — ABNORMAL HIGH (ref 70–99)

## 2020-08-07 LAB — BASIC METABOLIC PANEL
Anion gap: 12 (ref 5–15)
BUN: 45 mg/dL — ABNORMAL HIGH (ref 6–20)
CO2: 30 mmol/L (ref 22–32)
Calcium: 8.6 mg/dL — ABNORMAL LOW (ref 8.9–10.3)
Chloride: 96 mmol/L — ABNORMAL LOW (ref 98–111)
Creatinine, Ser: 1.08 mg/dL (ref 0.61–1.24)
GFR, Estimated: >60 mL/min (ref >60)
Glucose, Bld: 158 mg/dL — ABNORMAL HIGH (ref 70–99)
Potassium: 4.5 mmol/L (ref 3.5–5.1)
Sodium: 138 mmol/L (ref 135–145)

## 2020-08-07 LAB — MAGNESIUM: Magnesium: 2.7 mg/dL — ABNORMAL HIGH (ref 1.7–2.4)

## 2020-08-07 LAB — TRIGLYCERIDES: Triglycerides: 319 mg/dL — ABNORMAL HIGH (ref <150)

## 2020-08-07 LAB — PHOSPHORUS: Phosphorus: 3.9 mg/dL (ref 2.5–4.6)

## 2020-08-07 MED ORDER — OXYCODONE HCL 5 MG PO TABS
5.0000 mg | ORAL_TABLET | ORAL | Status: DC | PRN
  Administered 2020-08-07 (×2): 5 mg via ORAL
  Filled 2020-08-07 (×2): qty 1

## 2020-08-07 MED ORDER — ACETAMINOPHEN 500 MG PO TABS: 1000.0000 mg | ORAL_TABLET | Freq: Four times a day (QID) | ORAL | Status: DC | PRN

## 2020-08-07 MED ORDER — TRAVASOL 10 % IV SOLN
INTRAVENOUS | Status: AC
  Filled 2020-08-07: qty 1144.8

## 2020-08-07 MED ORDER — ALBUMIN HUMAN 25 % IV SOLN
25.0000 g | Freq: Once | INTRAVENOUS | Status: AC
  Administered 2020-08-07: 25 g via INTRAVENOUS
  Filled 2020-08-07: qty 100

## 2020-08-07 MED ORDER — KETOROLAC TROMETHAMINE 30 MG/ML IJ SOLN
30.0000 mg | Freq: Four times a day (QID) | INTRAMUSCULAR | Status: AC
  Administered 2020-08-07 – 2020-08-12 (×19): 30 mg via INTRAVENOUS
  Filled 2020-08-07 (×19): qty 1

## 2020-08-07 MED ORDER — HYDROMORPHONE HCL 1 MG/ML IJ SOLN
0.5000 mg | INTRAMUSCULAR | Status: DC | PRN
Start: 2020-08-07 — End: 2020-08-07

## 2020-08-07 MED ORDER — ACETAMINOPHEN 10 MG/ML IV SOLN
1000.0000 mg | Freq: Four times a day (QID) | INTRAVENOUS | Status: DC
  Filled 2020-08-07 (×2): qty 100

## 2020-08-07 MED ORDER — HYDROMORPHONE HCL 1 MG/ML IJ SOLN
0.5000 mg | INTRAMUSCULAR | Status: DC | PRN
  Administered 2020-08-07 – 2020-08-18 (×67): 0.5 mg via INTRAVENOUS
  Filled 2020-08-07 (×69): qty 0.5

## 2020-08-07 MED ORDER — LIDOCAINE 5 % EX PTCH
2.0000 | MEDICATED_PATCH | CUTANEOUS | Status: DC
  Administered 2020-08-07 – 2020-08-15 (×2): 2 via TRANSDERMAL
  Filled 2020-08-07 (×20): qty 2

## 2020-08-07 NOTE — Progress Notes (Signed)
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 Underwent exploratory lapratomy . Has been sinus tachycardic- per pt had this before.   [facility] hospitalist service on consult for management of medical comorbidities including lung persistent sinus tachycardia.   Assessment & Plan:   Principal Problem:   SBO (small bowel obstruction) (HCC) Active Problems:   Tobacco use   Abdominal pain   Nausea & vomiting   Leukocytosis   Tachycardia   Hypernatremia   Protein-calorie malnutrition, severe  #sinus tachycardia Per mom  had this issue in [place] in the past when was hospitalized Also patient reported to me had this issue before Suspect intravascular volume depletion in the setting of acute illness Patient 12 L net negative since admission Per nursing and surgery patient drinks large amounts of fluids then has as needed NG suctioning.  NG tube remains high output Plan: Continue telemetry Continue intravenous fluids.  Consider rate increase for persistent tachycardia Attempt to correct fluid balance.  Goal to achieve euvolemia Continue scheduled propranolol IV metoprolol as needed for sustained tachycardia heart rate greater than 130   #Small bowel obstruction -10/26--NG tube replaced by surgery. Patient follows with Dr. Jamal Maes is in the process of getting anal/rectal manometry 10/28- exploratory laparotomywith adhesionlysis -10/28Status post PICCplacement for TPN  -Gastrografin challenge  with persistent bowel obstruction 10/31-s/p exploratory laparotomy with extensive lysis of adhesions POD#5 [place] per primary surgical team Recommendations: Continue IVF.  Increase rate or bolus PRN for sustained tachycardia On TPN NGT in place    #Leukocytosis- Improving, afebrile. Possibly reactive/stress induced.     #Nutrition Continue with TPN Dietitian consulted Monitor for refeeding syndrome   #Chronic tobacco abuse. On nicotine patch  #hyperglycemia-no h/xo DM. -Hemoglobin A1c 6.1  Blood glucose levels or elevated  Dietician consult for TPN adjustments with elevated bg levels. Plan: Continue Lantus 5 units daily Resistant sliding scale Diabetes coordinator consult   #Euthyroid sick syndrome -Low THS, low T3 and normal T4 No indication for thyroid replacement at this time Recommend rechecking thyroid levels after resolution of acute illness  Thank you for the consult.  [facility] hospitalist service to continue following this patient with you   DVT prophylaxis: SCD Code Status: Full Family Communication: None today Disposition Plan: Per primary surgical team   Consultants:   Hospitalist  Procedures:   Ex lap with lysis of adhesions, 08/02/2020  Antimicrobials:   None   Subjective: Seen and examined.  Remains tachycardic.  Not in overt pain.  Objective: Vitals:   08/06/20 2006 08/06/20 2302 08/07/20 0318 08/07/20 0938  BP: (!) 125/99 (!) 125/97 (!) 135/99 (!) 130/105  Pulse: (!) 122 (!) 117 (!) 117 (!) 122  Resp: 17 15 16 14   Temp: 98.5 F (36.9 C) 98.6 F (37 C) 98.1 F (36.7 C) 98.3 F (36.8 C)  TempSrc: Oral Oral Oral Oral  SpO2: 100% 100% 100% 99%  Weight:      Height:        Intake/Output Summary (Last 24 hours) at 08/07/2020 1031 Last data filed at 08/07/2020 0400 Gross per 24 hour  Intake 440 ml  Output 4525 ml  Net -4085 ml   Filed Weights   08/02/20 0500 08/03/20 0432 08/04/20 0341  Weight: 60 kg 61.1 kg  61.3 kg    Examination:  General exam: Appears calm and comfortable  Respiratory system: Clear to auscultation. Respiratory effort normal. Cardiovascular system: S1 & S2 heard, RRR. No JVD, murmurs, rubs, gallops or clicks. No pedal edema. Gastrointestinal system: NG tube in place.  Abdomen mild TTP around surgical incision.  Nondistended.  Ileostomy right lower quadrant gas and stool in bag Central nervous system: Alert and oriented. No focal neurological deficits. Extremities: Symmetric 5 x 5 power. Skin: No rashes, lesions or ulcers Psychiatry: Judgement and insight appear normal. Mood & affect appropriate.     Data Reviewed: I have personally reviewed following labs and imaging studies  CBC: Recent Labs  Lab 08/01/20 0537 08/02/20 0429 08/04/20 0402 08/05/20 0340  WBC 7.3 7.0 14.1* 12.9*  HGB 13.7 13.9 9.8* 10.2*  HCT 44.6 44.5 31.1* 32.6*  MCV 73.8* 73.9* 72.0* 72.3*  PLT 345 328 124* 137*   Basic Metabolic Panel: Recent Labs  Lab 08/02/20 0429 08/02/20 0429 08/03/20 0504 08/04/20 0402 08/05/20 0340 08/06/20 0422 08/07/20 0422  NA 145   < > 144 148* 143 136 138  K 4.1   < > 4.5 3.8 3.7 4.9 4.5  CL 104   < > 110 112* 103 93* 96*  CO2 28   < > 27 28 30 28 30   GLUCOSE 174*   < > 250* 210* 232* 359* 158*  BUN 32*   < > 35* 19 25* 47* 45*  CREATININE 0.78   < > 1.11 0.85 0.93 1.38* 1.08  CALCIUM 8.7*   < > 7.1* 7.4* 7.9* 8.8* 8.6*  MG 2.3  --  2.0  --  2.8* 2.9* 2.7*  PHOS 2.7  --  2.7  --  3.0 4.6 3.9   < > = values in this interval not displayed.   GFR: Estimated Creatinine Clearance: 90.7 mL/min (by C-G formula based on SCr of 1.08 mg/dL). Liver Function Tests: Recent Labs  Lab 08/01/20 0537 08/02/20 0429 08/05/20 0340  AST 16 16 26   ALT 11 10 18   ALKPHOS 52 50 56  BILITOT 0.5 0.5 0.6  PROT 7.0 6.6 6.2*  ALBUMIN 3.3* 3.0* 2.1*   No results for input(s): LIPASE, AMYLASE in the last 168 hours. No results for input(s): AMMONIA in the last 168  hours. Coagulation Profile: No results for input(s): INR, PROTIME in the last 168 hours. Cardiac Enzymes: No results for input(s): CKTOTAL, CKMB, CKMBINDEX, TROPONINI in the last 168 hours. BNP (last 3 results) No results for input(s): PROBNP in the last 8760 hours. HbA1C: Recent Labs    08/05/20 0349  HGBA1C 6.1*   CBG: Recent Labs  Lab 08/06/20 1610 08/06/20 2008 08/06/20 2304 08/07/20 0320 08/07/20 0752  GLUCAP 311* 220* 226* 132* 178*   Lipid Profile: Recent Labs    08/06/20 0422 08/07/20 0422  TRIG 450* 319*   Thyroid Function Tests: No results for input(s): TSH, T4TOTAL, FREET4, T3FREE, THYROIDAB in the last 72 hours. Anemia Panel: No results for input(s): VITAMINB12, FOLATE, FERRITIN, TIBC, IRON, RETICCTPCT in the last 72 hours. Sepsis Labs: No results for input(s): PROCALCITON, LATICACIDVEN in the last 168 hours.  Recent Results (from the past 240 hour(s))  Culture, blood (single) w Reflex to ID Panel     Status: None (Preliminary result)   Collection Time: 08/03/20  2:44 PM   Specimen:  BLOOD  Result Value Ref Range Status   Specimen Description BLOOD BLOOD LEFT HAND  Final   Special Requests   Final    BOTTLES DRAWN AEROBIC AND ANAEROBIC Blood Culture results may not be optimal due to an inadequate volume of blood received in culture bottles   Culture   Final    NO GROWTH 4 DAYS Performed at [facility], [address] [place], [place] [address]    Report Status PENDING  Incomplete  Culture, blood (single) w Reflex to ID Panel     Status: None (Preliminary result)   Collection Time: 08/03/20  3:27 PM   Specimen: BLOOD  Result Value Ref Range Status   Specimen Description   Final    BLOOD Blood Culture results may not be optimal due to an inadequate volume of blood received in culture bottles   Special Requests   Final    BOTTLES DRAWN AEROBIC AND ANAEROBIC BLOOD LEFT HAND   Culture   Final    NO GROWTH 4 DAYS Performed at [facility], [address] [place], [place] [address]    Report Status PENDING  Incomplete  Urine Culture     Status: None   Collection Time: 08/03/20  6:41 PM   Specimen: Urine, Random  Result Value Ref Range Status   Specimen Description   Final    URINE, RANDOM Performed at [facility], [address] [place], [place] [address]    Special Requests   Final    NONE Performed at [facility], [address] [place], [place] [address]    Culture   Final    NO GROWTH Performed at [facility], [address] [place], [place] [address]    Report Status 08/05/2020 FINAL  Final         Radiology Studies: DG Abd 2 Views  Result Date: 08/07/2020 CLINICAL DATA:  Follow-up small bowel obstruction. Laparotomy 08/02/2020. EXAM: ABDOMEN - 2 VIEW COMPARISON:  08/06/2020 abdominal radiograph FINDINGS: Enteric tube terminates in the body of the stomach with side port near the esophagogastric junction. Marked diffuse small bowel dilatation with scattered small air-fluid levels, mildly decreased in degree of dilatation. Colostomy overlies the right lower quadrant. Retained barium in rectum. Right upper quadrant Rigler's sign again noted indicative of free air. No radiopaque nephrolithiasis. IMPRESSION: 1. Enteric tube terminates in the body of the stomach with side port near the esophagogastric junction. 2. Marked diffuse small bowel dilatation, mildly decreased, compatible with slight improvement in distal small bowel obstruction versus postoperative adynamic ileus. 3. Stable right upper quadrant Rigler's sign indicative of free air, presumably due to recent surgery. Electronically Signed   By: Delbert PhenixJason A Poff M.D.   On: 08/07/2020 08:40   DG Abd Portable 1V  Result Date: 08/06/2020 CLINICAL DATA:  Small-bowel obstruction EXAM: PORTABLE ABDOMEN - 1 VIEW COMPARISON:  08/02/2020. FINDINGS: NG tube noted with its tip over the upper portion of the stomach. Advancement of  approximately 10 cm should be considered. Persistent severely dilated loops of small bowel noted bowel dilatation up to 6 cm noted. Air is noted within the colon on today's exam. Right hemidiaphragm incompletely visualized. No free air 5. Ostomy noted over the right lower quadrant. Barium noted in the rectum. No free air identified. No acute bony abnormality identified. IMPRESSION: 1. NG tube noted with its tip over the upper portion of the stomach. Advancement of approximately 10 cm should be considered. 2. Persistent severely dilated loops  of small bowel again noted. Bowel dilatation up to 6 cm noted. Air noted within the colon on today's exam. No free air identified. Electronically Signed   By: Maisie Fus  Register   On: 08/06/2020 07:50        Scheduled Meds: . chlorhexidine  15 mL Mouth Rinse BID  . Chlorhexidine Gluconate Cloth  6 each Topical Daily  . insulin aspart  0-9 Units Subcutaneous Q4H  . ketorolac  30 mg Intravenous Q6H  . lidocaine  2 patch Transdermal Q24H  . mouth rinse  15 mL Mouth Rinse q12n4p  . propranolol  15 mg Oral TID   Continuous Infusions: . sodium chloride 100 mL/hr at 08/07/20 0529  . acetaminophen    . TPN ADULT (ION) 75 mL/hr at 08/06/20 1839     LOS: 10 days    Time spent: 25 minutes    Tresa Moore, MD [facility] Pager [phone]  If 7PM-7AM, please contact night-coverage 08/07/2020, 10:31 AM

## 2020-08-07 NOTE — Progress Notes (Addendum)
[facility] SURGICAL PROGRESS NOTE  Hospital Day(s): 10.   Post op day(s): 5 Days Post-Op.   Interval History:  Patient seen and examined no acute events or new complaints overnight.  Patient reports feels about the same, continues to use NGT intermittently for relief. Abdominal soreness No fever, chills, nausea, emesis Renal function improved this morning, sCr - 1.08, UO - 300 ccs + unmeasured Remains with hypermagnesemia to 2.7 NGT output recorded at 2.2L; he continues to request to be placed on LIS for "relief" but denied any nausea/emesis. NGT has been clamped this morning. Ileostomy with 2.0L out in last 24 hours He had been on CLD yesterday without reported issues  Vital signs in last 24 hours: [min-max] current  Temp:  [98.1 F (36.7 C)-99.2 F (37.3 C)] 98.1 F (36.7 C) (11/03 0318) Pulse Rate:  [113-130] 117 (11/03 0318) Resp:  [11-17] 16 (11/03 0318) BP: (125-141)/(90-110) 135/99 (11/03 0318) SpO2:  [95 %-100 %] 100 % (11/03 0318)     Height: 5\' 10"  (177.8 cm) Weight: 61.3 kg BMI (Calculated): 19.39   Intake/Output last 2 shifts:  11/02 0701 - 11/03 0700 In: 440 [P.O.:440] Out: 4525 [Urine:300; Emesis/NG output:2200; Stool:2025]   Physical Exam:  Constitutional: alert, cooperative and no distress  HEENT: NGT in place  Respiratory: breathing non-labored at rest  Cardiovascular: regular rate and sinus rhythm  Gastrointestinal: Soft, expected incisional soreness, no appreciable distension, no rebound/guarding, ileostomy in the RLQ with gas and stool in bag Integumentary: Midline laparotomy healing via secondary intention, no erythema or purulence, wound bad with almost 100% granulation tissue  Labs:  CBC Latest Ref Rng & Units 08/05/2020 08/04/2020 08/02/2020  WBC 4.0 - 10.5 K/uL 12.9(H) 14.1(H) 7.0  Hemoglobin 13.0 - 17.0 g/dL 10.2(L) 9.8(L) 13.9  Hematocrit 39 - 52 % 32.6(L) 31.1(L) 44.5  Platelets 150 - 400 K/uL 137(L) 124(L) 328   CMP Latest  Ref Rng & Units 08/07/2020 08/06/2020 08/05/2020  Glucose 70 - 99 mg/dL 13/10/2019) 528(U) 132(G)  BUN 6 - 20 mg/dL 401(U) 27(O) 53(G)  Creatinine 0.61 - 1.24 mg/dL 64(Q 0.34) 7.42(V  Sodium 135 - 145 mmol/L 138 136 143  Potassium 3.5 - 5.1 mmol/L 4.5 4.9 3.7  Chloride 98 - 111 mmol/L 96(L) 93(L) 103  CO2 22 - 32 mmol/L 30 28 30   Calcium 8.9 - 10.3 mg/dL 9.56) ) 7.9(L)  Total Protein 6.5 - 8.1 g/dL - - 6.2(L)  Total Bilirubin 0.3 - 1.2 mg/dL - - 0.6  Alkaline Phos 38 - 126 U/L - - 56  AST 15 - 41 U/L - - 26  ALT 0 - 44 U/L - - 18     Imaging studies:   KUB (08/07/220) personally reviewed persistent gastric and small bowel dilation, question ileus, and radiologist report reviewed below:  IMPRESSION: 1. Enteric tube terminates in the body of the stomach with side port near the esophagogastric junction. 2. Marked diffuse small bowel dilatation, mildly decreased, compatible with slight improvement in distal small bowel obstruction versus postoperative adynamic ileus. 3. Stable right upper quadrant Rigler's sign indicative of free air, presumably due to recent surgery.   Assessment/Plan:  24 y.o. male with persistently dilated loops of bowel 5 Days Post-Op s/p exploratory laparotomy and lysis of adhesions for small bowel obstruction secondary to internal hernia, complicated by pertinent comorbidities including s/p subtotal colectomy for reportedly Hirschsprung's disease however he does not have a formal diagnosis of this and was pending outpatient work up with GI.   - No significant  change today  - We will continue NGT for now. We will leave NGT clamped for now, he is requesting to be intermittently placed on LIS which is fine. It is difficult to determine the reliability of his measured NGT output. We will also follow with KUBs serially.   - Okay to continue CLD   - Continue maintenance fluids; NS 100 ml/hr; he has lots of GI losses   - Continue TPN at goal rate for now  - continue  local wound care: wet to dry dressing daily             - Monitor abdominal examination; on-going bowel function             - Pain control prn; antiemetics prn --> I will attempt to transition to PO medications, unsure how much he will tolerate/absorb              - Monitor leukocytosis; improving; likely reactive             - Mobilization encouraged             - DVT prophylaxis    All of the above findings and recommendations were discussed with the patient, and the medical team, and all of patient's questions were answered to his expressed satisfaction.   -- Lynden Oxford, PA-C [facility] 08/07/2020, 7:18 AM [phone] M-F: 7am - 4pm Patient seen and examined.  I agree with Mr. Gaynell Face.  We will try to reduce his narcotic requirements although this might be challenging.  Scheduled IV Tylenol and IV Toradol. Otherwise doing well and no evidence of surgical complications at this time continue TPN and NG

## 2020-08-07 NOTE — Progress Notes (Addendum)
Continued monitoring for signs of sepsis. Will continue to monitor.

## 2020-08-07 NOTE — Plan of Care (Addendum)
Pt Axox4. Calm and cooperative and able to voice his needs. Large output noted per NGT. NGT to LIWS twice for 2 hours each overnight. Pt tolerated well.   RLQ colostomy with brown liquid and mucus-like drainage noted. Pt receiving TPN at 62ml/hr. Pt tolerating clear liquid diet. Pt voiding clear amber urine per urinal. On RA. Prn pain meds alternating between Toradol and Dilaudid administered and effective. Use of IS and TCDB exercises education provided. Pt refused to have his abdominal dressing changed. Pt would like MD to do dressing changes. Midline abdominal dressing intact. Blood sugar levels better controlled after insulin adjustment. They are covered with insulin sliding scale per protocol.   Pt remains on [place] overnight. Prn pain meds and scheduled beta blockers administered per MD orders.  HR remains less than 130. Safety measures in place. Will continue to monitor.    Problem: Education: Goal: Knowledge of General Education information will improve Description: Including pain rating scale, medication(s)/side effects and non-pharmacologic comfort measures Outcome: Progressing   Problem: Health Behavior/Discharge Planning: Goal: Ability to manage health-related needs will improve Outcome: Progressing   Problem: Clinical Measurements: Goal: Ability to maintain clinical measurements within normal limits will improve Outcome: Progressing Goal: Will remain free from infection Outcome: Progressing Goal: Diagnostic test results will improve Outcome: Progressing Goal: Respiratory complications will improve Outcome: Progressing Goal: Cardiovascular complication will be avoided Outcome: Progressing   Problem: Activity: Goal: Risk for activity intolerance will decrease Outcome: Progressing   Problem: Nutrition: Goal: Adequate nutrition will be maintained Outcome: Progressing   Problem: Coping: Goal: Level of anxiety will decrease Outcome: Progressing   Problem:  Elimination: Goal: Will not experience complications related to bowel motility Outcome: Progressing Goal: Will not experience complications related to urinary retention Outcome: Progressing   Problem: Pain Managment: Goal: General experience of comfort will improve Outcome: Progressing   Problem: Safety: Goal: Ability to remain free from injury will improve Outcome: Progressing   Problem: Skin Integrity: Goal: Risk for impaired skin integrity will decrease Outcome: Progressing

## 2020-08-07 NOTE — Consult Note (Signed)
PHARMACY - TOTAL PARENTERAL NUTRITION CONSULT NOTE   Indication: Small bowel obstruction  Patient Measurements: Height: 5\' 10"  (177.8 cm) Weight: 64 kg (141 lb 1.5 oz) IBW/kg (Calculated) : 73 TPN AdjBW (KG): 65.5 Body mass index is 20.24 kg/m. Usual Weight: 64 kg  Assessment: Thomas Rivas is a 24 y.o. 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 Patient with poor appetite and oral intake and is at moderate refeeding risk. Glucose / Insulin: 132 - 359 mg/dL (29 units SSI required) - Electrolytes: WNL Renal: SCr  0.93 > 1.38 > 1.08 LFTs / TGs:          AST/ALT WNL         TG 106 > 239 > 320 > 450 > 319 Prealbumin / albumin: albumin 3.0 > 2.1 Intake / Output; MIVF: MIVF fluids stopped on 11/1, NS at 100 ml/hr restarted GI Imaging:  10/25 X-ray Abd: High-grade small bowel obstruction  10/26 X-ray Abd: Dilated large and small bowel loops unchanged 10/27 X-ray Abd: Multiple loops of dilated proximal mid small bowel in a pattern felt to be indicative of small bowel obstruction 10/28 X-ray Abd: persistent small bowel obstruction 10/29 X-ray Abd: Persistent severely dilated loops of small bowel noted consistent with small bowel obstruction 11/3 X-ray abd: Marked diffuse small bowel dilatation, with slight improvement in distal small bowel obstruction versus postoperative adynamic ileus. Surgeries / Procedures: 5 Days Post-Op s/p exploratory laparotomy with extensive lysis of adhesions  Central access: PICC line placed 08/01/20 TPN start date: 08/01/20  Nutritional Goals (per RD recommendation on 10/27): kCal: 2100-2400kcal/day, Protein: 110-120g/day, Fluid: 2.0-2.3L/day Goal TPN rate is 90 mL/hr (provides ~110 g of protein and ~ 2400 kcals per day)  Current Nutrition:  CLD started 10/31  Plan:    Re-advance TPN to 90 mL/hr starting at 1800 tonight: (total volume 2260 mL)  Protein: 114.5 g  Dextrose 17%; 356.4 g  Lipids: 73.4 g  kCal 2404  Electrolytes in TPN: 50 mEq/L of Na, 20 mEq/L of K, 5 mEq/L of Ca, 0 mEq/L of Mg, and 5 mmol/L of Phos as K and Phos are trending up towards upper limit of normal and bump in SCr. Cl:Ac ratio 1:1 -- Magnesium removed from TPN for tonight due to elevated magnesium  Add standard MVI and trace elements to TPN  continue SSI at sensitive scale (0-9 units) q4h  Continue regular insulin 20 units to TPN bag  MIVF: continue NS at 100 ml/hr per surgery team  Monitor TPN labs on Mon/Thurs: CMP, Mg, Phosphorus,TG -- Will recheck labs in AM.    11/31, PharmD, BCPS Clinical Pharmacist 08/07/2020 7:09 AM

## 2020-08-08 ENCOUNTER — Inpatient Hospital Stay: Payer: Medicaid Other

## 2020-08-08 LAB — TRIGLYCERIDES: Triglycerides: 451 mg/dL — ABNORMAL HIGH (ref <150)

## 2020-08-08 LAB — COMPREHENSIVE METABOLIC PANEL
ALT: 89 U/L — ABNORMAL HIGH (ref 0–44)
AST: 111 U/L — ABNORMAL HIGH (ref 15–41)
Albumin: 3.1 g/dL — ABNORMAL LOW (ref 3.5–5.0)
Alkaline Phosphatase: 158 U/L — ABNORMAL HIGH (ref 38–126)
Anion gap: 13 (ref 5–15)
BUN: 55 mg/dL — ABNORMAL HIGH (ref 6–20)
CO2: 30 mmol/L (ref 22–32)
Calcium: 8.6 mg/dL — ABNORMAL LOW (ref 8.9–10.3)
Chloride: 92 mmol/L — ABNORMAL LOW (ref 98–111)
Creatinine, Ser: 1.11 mg/dL (ref 0.61–1.24)
GFR, Estimated: >60 mL/min (ref >60)
Glucose, Bld: 243 mg/dL — ABNORMAL HIGH (ref 70–99)
Potassium: 4 mmol/L (ref 3.5–5.1)
Sodium: 135 mmol/L (ref 135–145)
Total Bilirubin: 1.6 mg/dL — ABNORMAL HIGH (ref 0.3–1.2)
Total Protein: 7.9 g/dL (ref 6.5–8.1)

## 2020-08-08 LAB — CULTURE, BLOOD (SINGLE)
Culture: NO GROWTH
Culture: NO GROWTH

## 2020-08-08 LAB — GLUCOSE, CAPILLARY
Glucose-Capillary: 157 mg/dL — ABNORMAL HIGH (ref 70–99)
Glucose-Capillary: 188 mg/dL — ABNORMAL HIGH (ref 70–99)
Glucose-Capillary: 188 mg/dL — ABNORMAL HIGH (ref 70–99)
Glucose-Capillary: 218 mg/dL — ABNORMAL HIGH (ref 70–99)
Glucose-Capillary: 233 mg/dL — ABNORMAL HIGH (ref 70–99)
Glucose-Capillary: 240 mg/dL — ABNORMAL HIGH (ref 70–99)
Glucose-Capillary: 283 mg/dL — ABNORMAL HIGH (ref 70–99)

## 2020-08-08 LAB — PHOSPHORUS: Phosphorus: 3.8 mg/dL (ref 2.5–4.6)

## 2020-08-08 LAB — MAGNESIUM: Magnesium: 2.7 mg/dL — ABNORMAL HIGH (ref 1.7–2.4)

## 2020-08-08 MED ORDER — SODIUM CHLORIDE 0.9 % IV SOLN
12.5000 mg | Freq: Four times a day (QID) | INTRAVENOUS | Status: DC | PRN
  Administered 2020-08-08 – 2020-08-13 (×4): 12.5 mg via INTRAVENOUS
  Filled 2020-08-08 (×7): qty 0.5

## 2020-08-08 MED ORDER — HYDRALAZINE HCL 20 MG/ML IJ SOLN
10.0000 mg | INTRAMUSCULAR | Status: DC | PRN
  Filled 2020-08-08: qty 1

## 2020-08-08 MED ORDER — PANTOPRAZOLE SODIUM 40 MG IV SOLR
40.0000 mg | INTRAVENOUS | Status: DC
  Administered 2020-08-08 – 2020-08-14 (×7): 40 mg via INTRAVENOUS
  Filled 2020-08-08 (×7): qty 40

## 2020-08-08 MED ORDER — TRAVASOL 10 % IV SOLN
INTRAVENOUS | Status: AC
  Filled 2020-08-08: qty 1197

## 2020-08-08 MED ORDER — ALBUMIN HUMAN 25 % IV SOLN
25.0000 g | Freq: Once | INTRAVENOUS | Status: AC
  Administered 2020-08-08: 25 g via INTRAVENOUS
  Filled 2020-08-08: qty 100

## 2020-08-08 NOTE — Progress Notes (Signed)
Pt wanted to be connected to suction twice today due to pt" connecting back to suction makes my hiccups go away for a while." Pt had one dose of thorazine without result.

## 2020-08-08 NOTE — Progress Notes (Signed)
Patient surgical wound has moderate drainage. Order is to change it BID and PRN as needed. Surgical team made aware.

## 2020-08-08 NOTE — Consult Note (Signed)
PHARMACY - TOTAL PARENTERAL NUTRITION CONSULT NOTE   Indication: Small bowel obstruction  Patient Measurements: Height: 5\' 10"  (177.8 cm) Weight: 64 kg (141 lb 1.5 oz) IBW/kg (Calculated) : 73 TPN AdjBW (KG): 65.5 Body mass index is 20.24 kg/m. Usual Weight: 64 kg  Assessment: Thomas Rivas is a 24 y.o. 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 Patient with poor appetite and oral intake and is at moderate refeeding risk. Triglycerides are back above 400 today after re-advancing TPN with lipids Glucose / Insulin: 152 - 243 mg/dL (13 units SSI required) - Electrolytes: WNL Renal: SCr  0.93 > 1.38 > 1.11 LFTs / TGs:          AST/ALT 111/89 (now elevated), Bilirubin now 1.6 mg/dL         TG April > 811 > 320 > 450 > 319 >  Prealbumin / albumin: albumin 3.0 > 2.1 Intake / Output; MIVF: MIVF: NS at 125 ml/hr  11/03 0701 - 11/04 0700 In: 4690.5 [P.O.:120; I.V.:4534.4; IV Piggyback:36.1] Out: 4845 [Urine:720; Emesis/NG output:2950; Stool:1175]  GI Imaging:  10/25 X-ray Abd: High-grade small bowel obstruction  10/26 X-ray Abd: Dilated large and small bowel loops unchanged 10/27 X-ray Abd: Multiple loops of dilated proximal mid small bowel in a pattern felt to be indicative of small bowel obstruction 10/28 X-ray Abd: persistent small bowel obstruction 10/29 X-ray Abd: Persistent severely dilated loops of small bowel noted consistent with small bowel obstruction 11/3 X-ray abd: Marked diffuse small bowel dilatation, with slight improvement in distal small bowel obstruction versus postoperative adynamic ileus. Surgeries / Procedures: 6 Days Post-Op s/p exploratory laparotomy with extensive lysis of adhesions  Central access: PICC line placed 08/01/20 TPN start date: 08/01/20  Nutritional Goals (per RD  recommendation on 10/27): kCal: 2100-2400kcal/day, Protein: 110-120g/day, Fluid: 2.0-2.3L/day Goal TPN rate is 90 mL/hr (provides ~110 g of protein and ~ 2400 kcals per day)  Current Nutrition:  CLD started 10/31  Plan:   Reduce TPN rate to 75 mL/hr starting at 1800 tonight: (total volume 1900 mL)  Protein: 119.7 g  Dextrose 17%; 306 g  Lipids: none  kCal 1519  Electrolytes in TPN: 75 mEq/L of Na, 30 mEq/L of K, 5 mEq/L of Ca, 0 mEq/L of Mg, and 5 mmol/L of Phos. Cl:Ac ratio 1.53:1 -- Magnesium removed from TPN due to elevated magnesium  Add standard MVI and trace elements to TPN  continue SSI at sensitive scale (0-9 units) q4h  Continue regular insulin 20 units to TPN bag  MIVF: continue NS at 125 ml/hr per surgery team  Monitor TPN labs on Mon/Thurs: CMP, Mg, Phosphorus,TG -- Will recheck labs in AM.    11/31, PharmD, BCPS Clinical Pharmacist 08/08/2020 7:11 AM

## 2020-08-08 NOTE — Progress Notes (Addendum)
Inpatient Diabetes Program Recommendations  AACE/ADA: New Consensus Statement on Inpatient Glycemic Control   Target Ranges:  Prepandial:   less than 140 mg/dL      Peak postprandial:   less than 180 mg/dL (1-2 hours)      Critically ill patients:  140 - 180 mg/dL   Results for EMRIK, ERHARD (MRN [id number]) as of 08/08/2020 09:12  Ref. Range 08/07/2020 07:52 08/07/2020 11:07 08/07/2020 15:51 08/07/2020 19:46 08/08/2020 00:04 08/08/2020 04:15 08/08/2020 07:55  Glucose-Capillary Latest Ref Range: 70 - 99 mg/dL 498 (H) 264 (H) 158 (H) 163 (H) 188 (H) 240 (H) 218 (H)   Review of Glycemic Control  Current orders for Inpatient glycemic control: Novolog 0-9 units Q4H; TPN @ 90 ml/hr with 20 units of insulin (rate to be decreased to 75 ml/hr starting today with new bag)  Inpatient Diabetes Program Recommendations:    Insulin in TPN: Thereasa Distance, RPh reports that TPN rate will be decreased to 75 ml/hr.  Since TPN will be decreased to 75 ml/hr, would recommend continuing with 20 units of insulin in TPN at this time. Will continue to follow along.  Thanks, Orlando Penner, RN, MSN, CDE Diabetes Coordinator Inpatient Diabetes Program [phone] (Team Pager from 8am to 5pm)

## 2020-08-08 NOTE — Progress Notes (Signed)
Chart reviewed.    Sinus tachycardia Persistent, likely owing to acute illness and volume depletion Seems to be slightly improved since TPN start Patient remains markedly net negative (~13L since admission) Recs: Continue IVF and TPN per gen surg Attempt to correct volume status (difficult as patient drinks large amts of water, then requests suction for bloating) Continue beta blocker  Hyperglycemia BG increasing after TPN start Will continue to monitor over next 24 hours and make adjustments in insulin regimen Continue sensitive sliding scale for now  Tobacco use Nicotine patch  SBO Management per gen surg  HTN Mainly diastolic htn noted Ensure pain control PRN IV hydralazine ordered  Thank you for the consult.  [facility] hospitalist service to follow this patient with you.  Please reach out with any questions.  No charge  Lolita Patella MD

## 2020-08-08 NOTE — Progress Notes (Signed)
[facility] SURGICAL PROGRESS NOTE  Hospital Day(s): 11.   Post op day(s): 6 Days Post-Op.   Interval History:  Patient seen and examined no acute events or new complaints overnight.  Patient reports he is doing okay, no complaints of nausea, emesis, distension Renal function normal, sCr - 1.11, UO - 720 ccs + unmeasured Still with hypermagnesemia to 2.7 Hypoalbuminemia to 3.1, likely secondary to malnutrition NGT output recorded at nearly 3.0L; he continues to request to be placed on LIS for "relief" but denied any nausea/emesis. Ileostomy with 1.1L out in last 24 hours He had been on CLD yesterday without reported issues   Vital signs in last 24 hours: [min-max] current  Temp:  [97.7 F (36.5 C)-99.1 F (37.3 C)] 98.5 F (36.9 C) (11/04 0417) Pulse Rate:  [91-122] 105 (11/04 0417) Resp:  [14-18] 16 (11/04 0417) BP: (124-138)/(90-110) 137/94 (11/04 0417) SpO2:  [98 %-100 %] 99 % (11/04 0417)     Height: 5\' 10"  (177.8 cm) Weight: 61.3 kg BMI (Calculated): 19.39   Intake/Output last 2 shifts:  11/03 0701 - 11/04 0700 In: 4690.5 [P.O.:120; I.V.:4534.4; IV Piggyback:36.1] Out: 4845 [Urine:720; Emesis/NG output:2950; Stool:1175]   Physical Exam:  Constitutional: alert, cooperative and no distress HEENT:NGT in place, clamped Respiratory: breathing non-labored at rest  Cardiovascular: regular rate and sinus rhythm  Gastrointestinal:Soft, expected incisional soreness, no appreciable distension, no rebound/guarding, ileostomy in the RLQ with gas and stool in bag Integumentary:Midline laparotomy healing via secondary intention, no erythema or purulence, wound bad with almost 100% granulation tissue   Labs:  CBC Latest Ref Rng & Units 08/05/2020 08/04/2020 08/02/2020  WBC 4.0 - 10.5 K/uL 12.9(H) 14.1(H) 7.0  Hemoglobin 13.0 - 17.0 g/dL 10.2(L) 9.8(L) 13.9  Hematocrit 39 - 52 % 32.6(L) 31.1(L) 44.5  Platelets 150 - 400 K/uL 137(L) 124(L) 328   CMP Latest Ref  Rng & Units 08/08/2020 08/07/2020 08/06/2020  Glucose 70 - 99 mg/dL 13/11/2019) 825(K) 539(J)  BUN 6 - 20 mg/dL 673(A) 19(F) 79(K)  Creatinine 0.61 - 1.24 mg/dL 24(O 9.73 5.32)  Sodium 135 - 145 mmol/L 135 138 136  Potassium 3.5 - 5.1 mmol/L 4.0 4.5 4.9  Chloride 98 - 111 mmol/L 92(L) 96(L) 93(L)  CO2 22 - 32 mmol/L 30 30 28   Calcium 8.9 - 10.3 mg/dL 9.92(E) ) 2.6(S)  Total Protein 6.5 - 8.1 g/dL 7.9 - -  Total Bilirubin 0.3 - 1.2 mg/dL 3.4(H) - -  Alkaline Phos 38 - 126 U/L 158(H) - -  AST 15 - 41 U/L 111(H) - -  ALT 0 - 44 U/L 89(H) - -     Imaging studies:   KUB (08/08/220) personally reviewed showing persistently dilated small bowel dilation upper abdomen with air-fluid levels on upright films, and radiologist report reviewed below:  IMPRESSION: 1. NG tube noted with tip over the stomach. Side hole is just above the gastroesophageal junction. NG tube advancement of approximately 10 cm should be considered. 2. Persistent small bowel obstruction. Free air again cannot be excluded.   Assessment/Plan:  24 y.o. male with persistently dilated loops of bowel, question adynamic ileus, 5 Days Post-Op s/p exploratory laparotomy and lysis of adhesions for small bowel obstruction secondary to internal hernia, complicated by pertinent comorbidities includings/p subtotal colectomy for reportedly Hirschsprung's disease however he does nothave a formal diagnosis of this and was pending outpatient work up with GI.   - I have encouraged him to attempt to leave NGT clamped today and only place to suction should he  become nausea/distended. He seems to be in agreement. We need to try and make progress either way. He was encouraged to ambulate in hallway and get out of bed, which he agrees to.               - Okay to continue CLD             - Continue maintenance fluids; NS 100 ml/hr; he has lots of GI losses              - We will decrease TPN rate and remove lipids given elevated triglycerides  and hyperglycemia.              - continue local wound care: wet to dry dressing daily - Monitor abdominal examination; on-going bowel function - Pain control prn; antiemetics prn --> I will attempt to transition to PO medications, unsure how much he will tolerate/absorb  - Monitor leukocytosis; improving; likely reactive - Mobilization encouraged - DVT prophylaxis   All of the above findings and recommendations were discussed with the patient, and the medical team, and all of patient's questions were answered tohisexpressed satisfaction.   -- Lynden Oxford, PA-C [facility] 08/08/2020, 7:24 AM [phone] M-F: 7am - 4pm

## 2020-08-08 NOTE — Progress Notes (Signed)
Pt mother said orange gatorade  mixed it in water or dilute in water will decrease patient hiccups. This what they did to him when he had surgery in [place]. So we will try this intervention.

## 2020-08-08 NOTE — Plan of Care (Addendum)
Pt Axox4. Calm and cooperative and able to voice his needs. Prn Dilaudid administered once overnight for pain relief after dressing change. Scheduled Toradol administered per orders. Wet to dry dressing change performed. Abdominal midline vertical incision with wound noted to have blackened small areas.  At the bottom of the dressing, large amount of serosanguinous drainage noted. Extra gauze applied to contain drainage. Ostomy bag was changed twice, as the content is leaking. Full bed change performed twice.  NGT to LIWS and clamped but then put back to LIWS related to patient having increasing hiccups and abdominal gas. Abdominal x ray to be done this am.   RLQ colostomy with pure green drainage noted. Pt receiving TPN at 41ml/hr and is on IVF. Pt tolerating clear liquid diet. Pt voiding clear amber urine per urinal. On RA. Use of IS and TCDB exercises education provided. Pt got out of bed and sat down on a chair for 30 minutes. Tolerated well. Blood sugar levels being covered with insulin sliding scale per protocol.   Pt remains on [place] overnight. Prn pain meds and scheduled beta blockers administered per MD orders. Lab results reviewed and they are stable. Pt's mother would like to speak to MD about pt condition and treatment plan. Safety measures in place. Will continue to monitor.   Problem: Education: Goal: Knowledge of General Education information will improve Description: Including pain rating scale, medication(s)/side effects and non-pharmacologic comfort measures 08/08/2020 0537 by Darolyn Rua, RN Outcome: Progressing 08/08/2020 0447 by Darolyn Rua, RN Outcome: Progressing   Problem: Health Behavior/Discharge Planning: Goal: Ability to manage health-related needs will improve 08/08/2020 0537 by Darolyn Rua, RN Outcome: Progressing 08/08/2020 0447 by Darolyn Rua, RN Outcome: Progressing   Problem: Clinical Measurements: Goal: Ability to maintain clinical  measurements within normal limits will improve 08/08/2020 0537 by Darolyn Rua, RN Outcome: Progressing 08/08/2020 0447 by Darolyn Rua, RN Outcome: Progressing Goal: Will remain free from infection 08/08/2020 0537 by Darolyn Rua, RN Outcome: Progressing 08/08/2020 0447 by Darolyn Rua, RN Outcome: Progressing Goal: Diagnostic test results will improve 08/08/2020 0537 by Darolyn Rua, RN Outcome: Progressing 08/08/2020 0447 by Darolyn Rua, RN Outcome: Progressing Goal: Respiratory complications will improve 08/08/2020 0537 by Darolyn Rua, RN Outcome: Progressing 08/08/2020 0447 by Darolyn Rua, RN Outcome: Progressing Goal: Cardiovascular complication will be avoided 08/08/2020 0537 by Darolyn Rua, RN Outcome: Progressing 08/08/2020 0447 by Darolyn Rua, RN Outcome: Progressing   Problem: Activity: Goal: Risk for activity intolerance will decrease 08/08/2020 0537 by Darolyn Rua, RN Outcome: Progressing 08/08/2020 0447 by Darolyn Rua, RN Outcome: Progressing   Problem: Nutrition: Goal: Adequate nutrition will be maintained 08/08/2020 0537 by Darolyn Rua, RN Outcome: Progressing 08/08/2020 0447 by Darolyn Rua, RN Outcome: Progressing   Problem: Coping: Goal: Level of anxiety will decrease 08/08/2020 0537 by Darolyn Rua, RN Outcome: Progressing 08/08/2020 0447 by Darolyn Rua, RN Outcome: Progressing   Problem: Elimination: Goal: Will not experience complications related to bowel motility 08/08/2020 0537 by Darolyn Rua, RN Outcome: Progressing 08/08/2020 0447 by Darolyn Rua, RN Outcome: Progressing Goal: Will not experience complications related to urinary retention 08/08/2020 0537 by Darolyn Rua, RN Outcome: Progressing 08/08/2020 0447 by Darolyn Rua, RN Outcome: Progressing   Problem: Pain Managment: Goal: General experience of comfort will improve 08/08/2020 0537 by Darolyn Rua, RN Outcome:  Progressing 08/08/2020 0447 by Darolyn Rua, RN Outcome: Progressing   Problem: Safety: Goal: Ability to remain free from injury will improve 08/08/2020 0537 by Darolyn Rua, RN Outcome: Progressing 08/08/2020 0447 by Darolyn Rua,  RN Outcome: Progressing   Problem: Skin Integrity: Goal: Risk for impaired skin integrity will decrease 08/08/2020 0537 by Darolyn Rua, RN Outcome: Progressing 08/08/2020 0447 by Darolyn Rua, RN Outcome: Progressing

## 2020-08-09 ENCOUNTER — Encounter
Admission: AD | Disposition: A | Discharge status: Home / Self Care | Payer: Self-pay | Source: Other Acute Inpatient Hospital | Attending: Surgery

## 2020-08-09 ENCOUNTER — Inpatient Hospital Stay: Payer: Medicaid Other | Admitting: Certified Registered Nurse Anesthetist

## 2020-08-09 ENCOUNTER — Inpatient Hospital Stay: Payer: Medicaid Other

## 2020-08-09 ENCOUNTER — Encounter: Payer: Self-pay | Admitting: Internal Medicine

## 2020-08-09 DIAGNOSIS — K56609 Unspecified intestinal obstruction, unspecified as to partial versus complete obstruction: Secondary | ICD-10-CM | POA: Diagnosis not present

## 2020-08-09 DIAGNOSIS — T8131XA Disruption of external operation (surgical) wound, not elsewhere classified, initial encounter: Secondary | ICD-10-CM

## 2020-08-09 DIAGNOSIS — K565 Intestinal adhesions [bands], unspecified as to partial versus complete obstruction: Secondary | ICD-10-CM | POA: Diagnosis not present

## 2020-08-09 HISTORY — PX: ABDOMINAL WOUND DEHISCENCE: SHX540

## 2020-08-09 LAB — COMPREHENSIVE METABOLIC PANEL
ALT: 86 U/L — ABNORMAL HIGH (ref 0–44)
AST: 46 U/L — ABNORMAL HIGH (ref 15–41)
Albumin: 3 g/dL — ABNORMAL LOW (ref 3.5–5.0)
Alkaline Phosphatase: 154 U/L — ABNORMAL HIGH (ref 38–126)
Anion gap: 10 (ref 5–15)
BUN: 43 mg/dL — ABNORMAL HIGH (ref 6–20)
CO2: 27 mmol/L (ref 22–32)
Calcium: 8.7 mg/dL — ABNORMAL LOW (ref 8.9–10.3)
Chloride: 99 mmol/L (ref 98–111)
Creatinine, Ser: 0.92 mg/dL (ref 0.61–1.24)
GFR, Estimated: >60 mL/min (ref >60)
Glucose, Bld: 136 mg/dL — ABNORMAL HIGH (ref 70–99)
Potassium: 3.9 mmol/L (ref 3.5–5.1)
Sodium: 136 mmol/L (ref 135–145)
Total Bilirubin: 1.1 mg/dL (ref 0.3–1.2)
Total Protein: 7.2 g/dL (ref 6.5–8.1)

## 2020-08-09 LAB — GLUCOSE, CAPILLARY
Glucose-Capillary: 152 mg/dL — ABNORMAL HIGH (ref 70–99)
Glucose-Capillary: 152 mg/dL — ABNORMAL HIGH (ref 70–99)
Glucose-Capillary: 116 mg/dL — ABNORMAL HIGH (ref 70–99)
Glucose-Capillary: 133 mg/dL — ABNORMAL HIGH (ref 70–99)
Glucose-Capillary: 156 mg/dL — ABNORMAL HIGH (ref 70–99)
Glucose-Capillary: 117 mg/dL — ABNORMAL HIGH (ref 70–99)
Glucose-Capillary: 132 mg/dL — ABNORMAL HIGH (ref 70–99)

## 2020-08-09 LAB — TRIGLYCERIDES: Triglycerides: 225 mg/dL — ABNORMAL HIGH (ref <150)

## 2020-08-09 LAB — MAGNESIUM: Magnesium: 2.3 mg/dL (ref 1.7–2.4)

## 2020-08-09 SURGERY — REPAIR, DEHISCENCE, WOUND, ABDOMEN
Anesthesia: General | Site: Abdomen

## 2020-08-09 MED ORDER — OXYCODONE HCL 5 MG PO TABS
5.0000 mg | ORAL_TABLET | ORAL | Status: DC | PRN
  Administered 2020-08-09 – 2020-08-19 (×6): 10 mg via NASOGASTRIC
  Administered 2020-08-20: 5 mg via NASOGASTRIC
  Administered 2020-08-20: 10 mg via NASOGASTRIC
  Administered 2020-08-21: 5 mg via NASOGASTRIC
  Administered 2020-08-22: 10 mg via NASOGASTRIC
  Administered 2020-08-22: 5 mg via NASOGASTRIC
  Administered 2020-08-23 – 2020-08-25 (×3): 10 mg via NASOGASTRIC
  Administered 2020-08-26: 5 mg via NASOGASTRIC
  Filled 2020-08-09 (×5): qty 2
  Filled 2020-08-09: qty 1
  Filled 2020-08-09: qty 2
  Filled 2020-08-09: qty 1
  Filled 2020-08-09 (×2): qty 2
  Filled 2020-08-09: qty 1
  Filled 2020-08-09 (×4): qty 2
  Filled 2020-08-09: qty 1

## 2020-08-09 MED ORDER — DEXAMETHASONE SODIUM PHOSPHATE 10 MG/ML IJ SOLN
INTRAMUSCULAR | Status: AC
  Filled 2020-08-09: qty 1

## 2020-08-09 MED ORDER — TRAVASOL 10 % IV SOLN
INTRAVENOUS | Status: AC
  Filled 2020-08-09: qty 1197

## 2020-08-09 MED ORDER — SODIUM CHLORIDE 0.9 % IV SOLN
2.0000 g | Freq: Two times a day (BID) | INTRAVENOUS | Status: AC
  Administered 2020-08-09 – 2020-08-10 (×3): 2 g via INTRAVENOUS
  Filled 2020-08-09 (×4): qty 2

## 2020-08-09 MED ORDER — MIDAZOLAM HCL 2 MG/2ML IJ SOLN
INTRAMUSCULAR | Status: AC
  Filled 2020-08-09: qty 2

## 2020-08-09 MED ORDER — LIDOCAINE HCL (PF) 2 % IJ SOLN
INTRAMUSCULAR | Status: AC
  Filled 2020-08-09: qty 5

## 2020-08-09 MED ORDER — BUPIVACAINE-EPINEPHRINE (PF) 0.25% -1:200000 IJ SOLN
INTRAMUSCULAR | Status: AC
  Filled 2020-08-09: qty 30

## 2020-08-09 MED ORDER — LIDOCAINE-EPINEPHRINE 1 %-1:100000 IJ SOLN
20.0000 mL | Freq: Once | INTRAMUSCULAR | Status: DC
  Filled 2020-08-09: qty 20

## 2020-08-09 MED ORDER — PHENYLEPHRINE HCL (PRESSORS) 10 MG/ML IV SOLN
INTRAVENOUS | Status: DC | PRN
  Administered 2020-08-09 (×2): 100 ug via INTRAVENOUS

## 2020-08-09 MED ORDER — PROPOFOL 10 MG/ML IV BOLUS
INTRAVENOUS | Status: AC
  Filled 2020-08-09: qty 20

## 2020-08-09 MED ORDER — LIDOCAINE HCL (CARDIAC) PF 100 MG/5ML IV SOSY
PREFILLED_SYRINGE | INTRAVENOUS | Status: DC | PRN
  Administered 2020-08-09: 80 mg via INTRAVENOUS

## 2020-08-09 MED ORDER — PROPRANOLOL HCL 10 MG PO TABS
15.0000 mg | ORAL_TABLET | Freq: Three times a day (TID) | ORAL | Status: DC
  Administered 2020-08-09 – 2020-08-11 (×6): 15 mg via NASOGASTRIC
  Filled 2020-08-09 (×8): qty 1.5

## 2020-08-09 MED ORDER — BUPIVACAINE LIPOSOME 1.3 % IJ SUSP
INTRAMUSCULAR | Status: AC
  Filled 2020-08-09: qty 20

## 2020-08-09 MED ORDER — SUCCINYLCHOLINE CHLORIDE 20 MG/ML IJ SOLN
INTRAMUSCULAR | Status: DC | PRN
  Administered 2020-08-09: 100 mg via INTRAVENOUS

## 2020-08-09 MED ORDER — DEXMEDETOMIDINE (PRECEDEX) IN NS 20 MCG/5ML (4 MCG/ML) IV SYRINGE
PREFILLED_SYRINGE | INTRAVENOUS | Status: DC | PRN
  Administered 2020-08-09: 8 ug via INTRAVENOUS
  Administered 2020-08-09 (×3): 4 ug via INTRAVENOUS

## 2020-08-09 MED ORDER — FENTANYL CITRATE (PF) 100 MCG/2ML IJ SOLN
INTRAMUSCULAR | Status: DC | PRN
  Administered 2020-08-09 (×2): 50 ug via INTRAVENOUS

## 2020-08-09 MED ORDER — BUPIVACAINE LIPOSOME 1.3 % IJ SUSP
INTRAMUSCULAR | Status: DC | PRN
  Administered 2020-08-09: 20 mL

## 2020-08-09 MED ORDER — PROPOFOL 10 MG/ML IV BOLUS
INTRAVENOUS | Status: DC | PRN
  Administered 2020-08-09: 130 mg via INTRAVENOUS

## 2020-08-09 MED ORDER — FENTANYL CITRATE (PF) 100 MCG/2ML IJ SOLN
INTRAMUSCULAR | Status: AC
  Filled 2020-08-09: qty 2

## 2020-08-09 MED ORDER — ONDANSETRON HCL 4 MG/2ML IJ SOLN
INTRAMUSCULAR | Status: DC | PRN
  Administered 2020-08-09: 4 mg via INTRAVENOUS

## 2020-08-09 MED ORDER — OXYCODONE HCL 5 MG/5ML PO SOLN: 5.0000 mg | Freq: Once | ORAL | Status: DC | PRN

## 2020-08-09 MED ORDER — BUPIVACAINE-EPINEPHRINE (PF) 0.25% -1:200000 IJ SOLN
INTRAMUSCULAR | Status: DC | PRN
  Administered 2020-08-09: 20 mL

## 2020-08-09 MED ORDER — MIDAZOLAM HCL 2 MG/2ML IJ SOLN
INTRAMUSCULAR | Status: DC | PRN
  Administered 2020-08-09: 2 mg via INTRAVENOUS

## 2020-08-09 MED ORDER — LACTATED RINGERS IV SOLN: INTRAVENOUS | Status: DC | PRN

## 2020-08-09 MED ORDER — HYDROMORPHONE HCL 1 MG/ML IJ SOLN
INTRAMUSCULAR | Status: AC
  Filled 2020-08-09: qty 1

## 2020-08-09 MED ORDER — OXYCODONE HCL 5 MG PO TABS: 5.0000 mg | ORAL_TABLET | Freq: Once | ORAL | Status: DC | PRN

## 2020-08-09 MED ORDER — FENTANYL CITRATE (PF) 100 MCG/2ML IJ SOLN: 25.0000 ug | INTRAMUSCULAR | Status: DC | PRN

## 2020-08-09 MED ORDER — ONDANSETRON HCL 4 MG/2ML IJ SOLN
INTRAMUSCULAR | Status: AC
  Filled 2020-08-09: qty 2

## 2020-08-09 MED ORDER — ACETAMINOPHEN 10 MG/ML IV SOLN
1000.0000 mg | Freq: Four times a day (QID) | INTRAVENOUS | Status: AC
  Administered 2020-08-09 – 2020-08-10 (×4): 1000 mg via INTRAVENOUS
  Filled 2020-08-09 (×4): qty 100

## 2020-08-09 MED ORDER — ALBUMIN HUMAN 5 % IV SOLN
INTRAVENOUS | Status: AC
  Filled 2020-08-09: qty 250

## 2020-08-09 MED ORDER — HYDROMORPHONE HCL 1 MG/ML IJ SOLN
INTRAMUSCULAR | Status: DC | PRN
Start: 2020-08-09 — End: 2020-08-09
  Administered 2020-08-09 (×2): .5 mg via INTRAVENOUS

## 2020-08-09 MED ORDER — GLYCOPYRROLATE 0.2 MG/ML IJ SOLN
INTRAMUSCULAR | Status: AC
  Filled 2020-08-09: qty 1

## 2020-08-09 MED ORDER — FENTANYL 12 MCG/HR TD PT72
1.0000 | MEDICATED_PATCH | TRANSDERMAL | Status: DC
  Administered 2020-08-09: 1 via TRANSDERMAL
  Filled 2020-08-09: qty 1

## 2020-08-09 MED ORDER — ACETAMINOPHEN 500 MG PO TABS: 1000.0000 mg | ORAL_TABLET | Freq: Four times a day (QID) | ORAL | Status: DC | PRN

## 2020-08-09 SURGICAL SUPPLY — 64 items
ADH LQ OCL WTPRF AMP STRL LF (MISCELLANEOUS) ×2
ADHESIVE MASTISOL STRL (MISCELLANEOUS) ×4 IMPLANT
APL PRP STRL LF DISP 70% ISPRP (MISCELLANEOUS)
APPLIER CLIP 11 MED OPEN (CLIP)
APPLIER CLIP 13 LRG OPEN (CLIP)
APR CLP LRG 13 20 CLIP (CLIP)
APR CLP MED 11 20 MLT OPN (CLIP)
BARRIER ADH SEPRAFILM 3INX5IN (MISCELLANEOUS) IMPLANT
BLADE CLIPPER SURG (BLADE) IMPLANT
BLADE SURG SZ10 CARB STEEL (BLADE) ×4 IMPLANT
BNDG GAUZE 4.5X4.1 6PLY STRL (MISCELLANEOUS) ×4 IMPLANT
BRR ADH 5X3 SEPRAFILM 2 SHT (MISCELLANEOUS)
CANISTER SUCT 3000ML PPV (MISCELLANEOUS) IMPLANT
CHLORAPREP W/TINT 26 (MISCELLANEOUS) IMPLANT
CLIP APPLIE 11 MED OPEN (CLIP) IMPLANT
CLIP APPLIE 13 LRG OPEN (CLIP) IMPLANT
COVER BACK TABLE REUSABLE LG (DRAPES) ×4 IMPLANT
COVER WAND RF STERILE (DRAPES) ×4 IMPLANT
DRAPE LAPAROTOMY 100X77 ABD (DRAPES) ×4 IMPLANT
DRSG OPSITE POSTOP 4X10 (GAUZE/BANDAGES/DRESSINGS) IMPLANT
DRSG OPSITE POSTOP 4X12 (GAUZE/BANDAGES/DRESSINGS) IMPLANT
DRSG TEGADERM 4X4.75 (GAUZE/BANDAGES/DRESSINGS) ×4 IMPLANT
ELECT BLADE 6.5 EXT (BLADE) ×4 IMPLANT
ELECT REM PT RETURN 9FT ADLT (ELECTROSURGICAL) ×4
ELECTRODE REM PT RTRN 9FT ADLT (ELECTROSURGICAL) ×2 IMPLANT
GLOVE BIO SURGEON STRL SZ7 (GLOVE) ×4 IMPLANT
GOWN STRL REUS W/ TWL LRG LVL3 (GOWN DISPOSABLE) ×4 IMPLANT
GOWN STRL REUS W/TWL LRG LVL3 (GOWN DISPOSABLE) ×8
HANDLE SUCTION POOLE (INSTRUMENTS) ×2 IMPLANT
HANDLE YANKAUER SUCT BULB TIP (MISCELLANEOUS) ×4 IMPLANT
KIT OSTOMY 2 PC DRNBL 2.25 STR (WOUND CARE) ×4 IMPLANT
KIT OSTOMY DRAINABLE 2.25 STR (WOUND CARE) ×8
LIGASURE IMPACT 36 18CM CVD LR (INSTRUMENTS) IMPLANT
MANIFOLD NEPTUNE II (INSTRUMENTS) ×4 IMPLANT
NEEDLE HYPO 22GX1.5 SAFETY (NEEDLE) ×8 IMPLANT
PACK BASIN MAJOR ARMC (MISCELLANEOUS) ×4 IMPLANT
PAD ABD DERMACEA PRESS 5X9 (GAUZE/BANDAGES/DRESSINGS) ×4 IMPLANT
RELOAD PROXIMATE 75MM BLUE (ENDOMECHANICALS) IMPLANT
SOL PREP PVP 2OZ (MISCELLANEOUS) ×4
SOLUTION PREP PVP 2OZ (MISCELLANEOUS) ×2 IMPLANT
SPONGE LAP 18X18 RF (DISPOSABLE) ×4 IMPLANT
SPONGE LAP 18X36 RFD (DISPOSABLE) IMPLANT
STAPLER PROXIMATE 75MM BLUE (STAPLE) IMPLANT
STAPLER SKIN PROX 35W (STAPLE) IMPLANT
SUCTION POOLE HANDLE (INSTRUMENTS) ×4
SUT BOLSTER W/RETENTN TUBE (SUTURE) ×4 IMPLANT
SUT ETHILON 2 0 FS 18 (SUTURE) ×8 IMPLANT
SUT ETHILON NAB BLK LR #2 30IN (SUTURE) ×8 IMPLANT
SUT PDS AB 0 CT1 27 (SUTURE) ×16 IMPLANT
SUT SILK 2 0 (SUTURE)
SUT SILK 2 0 SH CR/8 (SUTURE) ×4 IMPLANT
SUT SILK 2 0SH CR/8 30 (SUTURE) IMPLANT
SUT SILK 2-0 18XBRD TIE 12 (SUTURE) IMPLANT
SUT VIC AB 0 CT1 36 (SUTURE) ×8 IMPLANT
SUT VIC AB 2-0 SH 27 (SUTURE) ×8
SUT VIC AB 2-0 SH 27XBRD (SUTURE) ×4 IMPLANT
SUT VIC AB 3-0 SH 27 (SUTURE) ×4
SUT VIC AB 3-0 SH 27X BRD (SUTURE) ×2 IMPLANT
SYR 20ML LL LF (SYRINGE) ×4 IMPLANT
SYR 30ML LL (SYRINGE) ×8 IMPLANT
SYR 3ML LL SCALE MARK (SYRINGE) ×4 IMPLANT
SYR BULB IRRIG 60ML STRL (SYRINGE) ×4 IMPLANT
TAPE CLOTH SURG 4X10 WHT LF (GAUZE/BANDAGES/DRESSINGS) ×4 IMPLANT
TRAY FOLEY MTR SLVR 16FR STAT (SET/KITS/TRAYS/PACK) IMPLANT

## 2020-08-09 NOTE — Anesthesia Procedure Notes (Signed)
Procedure Name: Intubation Date/Time: 08/09/2020 10:37 AM Performed by: Elmarie Mainland, CRNA Pre-anesthesia Checklist: Patient identified, Emergency Drugs available, Suction available and Patient being monitored Patient Re-evaluated:Patient Re-evaluated prior to induction Oxygen Delivery Method: Circle system utilized Preoxygenation: Pre-oxygenation with 100% oxygen Induction Type: IV induction, Rapid sequence and Cricoid Pressure applied Laryngoscope Size: McGraph and 4 Grade View: Grade I Tube type: Oral Number of attempts: 1 Airway Equipment and Method: Stylet and Video-laryngoscopy Placement Confirmation: ETT inserted through vocal cords under direct vision,  positive ETCO2 and breath sounds checked- equal and bilateral Secured at: 22 cm Tube secured with: Tape Dental Injury: Teeth and Oropharynx as per pre-operative assessment

## 2020-08-09 NOTE — Progress Notes (Addendum)
[facility] SURGICAL PROGRESS NOTE  Hospital Day(s): 12.   Post op day(s): 7 Days Post-Op.   Interval History:  Patient seen and examined no acute events or new complaints overnight.  Patient reports he is doing okay, no complaints of nausea, emesis, distension but he does continue to have intermittent hiccups. Thorazine ordered with relief.  Renal function normal, sCr - 0.92, UO - 750 ccs + unmeasured No electrolyte derangements today Hypoalbuminemia to 3.0, likely secondary to malnutrition Triglycerides improved with adjustments in TPN yesterday NGT output recorded at nearly 3.0L; he continues to request to be placed on LIS for "relief" but denied any nausea/emesis. Ileostomy with 2.2L out in last 24 hours He had been on CLD yesterday without reported issues Mobilized yesterday   Yesterday he began to notice increase in serous drainage from midline wound and was found to have a very small "pin sized" hole in his fascia. This morning, he reports that he is still having drainage but he thinks it is less.   Vital signs in last 24 hours: [min-max] current  Temp:  [98 F (36.7 C)-98.6 F (37 C)] 98.1 F (36.7 C) (11/05 0412) Pulse Rate:  [98-102] 101 (11/05 0412) Resp:  [15-18] 18 (11/05 0412) BP: (113-128)/(84-96) 122/87 (11/05 0412) SpO2:  [99 %-100 %] 100 % (11/05 0412) Weight:  [57.2 kg] 57.2 kg (11/05 0600)     Height: 5\' 10"  (177.8 cm) Weight: 57.2 kg BMI (Calculated): 18.09   Intake/Output last 2 shifts:  11/04 0701 - 11/05 0700 In: 6920.5 [P.O.:960; I.V.:5910.5; IV Piggyback:50] Out: 5975 [Urine:750; Emesis/NG output:3025; Stool:2200]   Physical Exam:  Constitutional: alert, cooperative and no distress HEENT:NGT in place, clamped Respiratory: breathing non-labored at rest  Cardiovascular: regular rate and sinus rhythm  Gastrointestinal:Soft, expected incisional soreness, no appreciable distension, no rebound/guarding, ileostomy in the RLQ with gas  and stool in bag Integumentary:Midline laparotomy healing via secondary intention, he unfortunately looks like he has developed a 1x1 cm area of fascial dehiscence to the inferior half of his wound, there is small bowel visible below this. The remaining wound is healing well.   Labs:  CBC Latest Ref Rng & Units 08/05/2020 08/04/2020 08/02/2020  WBC 4.0 - 10.5 K/uL 12.9(H) 14.1(H) 7.0  Hemoglobin 13.0 - 17.0 g/dL 10.2(L) 9.8(L) 13.9  Hematocrit 39 - 52 % 32.6(L) 31.1(L) 44.5  Platelets 150 - 400 K/uL 137(L) 124(L) 328   CMP Latest Ref Rng & Units 08/09/2020 08/08/2020 08/07/2020  Glucose 70 - 99 mg/dL 13/12/2019) 621(H) 086(V)  BUN 6 - 20 mg/dL 784(O) 96(E) 95(M)  Creatinine 0.61 - 1.24 mg/dL 84(X 3.24 4.01  Sodium 135 - 145 mmol/L 136 135 138  Potassium 3.5 - 5.1 mmol/L 3.9 4.0 4.5  Chloride 98 - 111 mmol/L 99 92(L) 96(L)  CO2 22 - 32 mmol/L 27 30 30   Calcium 8.9 - 10.3 mg/dL 0.27) ) 2.5(D)  Total Protein 6.5 - 8.1 g/dL 7.2 7.9 -  Total Bilirubin 0.3 - 1.2 mg/dL 1.1 6.6(Y) -  Alkaline Phos 38 - 126 U/L 154(H) 158(H) -  AST 15 - 41 U/L 46(H) 111(H) -  ALT 0 - 44 U/L 86(H) 89(H) -     Imaging studies: No new imaging studies   Assessment/Plan:  24 y.o. 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   - I have placed him under strict bed rest and NPO for now given the concern over wound dehiscence this morning. Dr [name] will evaluate to determine if intervention is warranted. Defer decision to him.    - Continue to use NGT intermittently for relief             - Continue maintenance fluids; NS 100 ml/hr; he has lots of GI losses               - Continue TPN today at 4ml/hr without lipids/dextrose; monitor; may need to still consider going to 1/2 rate if needed              - continue local wound care: wet to dry dressing daily + prn - Monitor abdominal examination; on-going bowel function - Pain control prn; antiemetics prn --> I will attempt to transition to PO medications, unsure how much he will tolerate/absorb  - Monitor leukocytosis; improving; likely reactive - Mobilization encouraged - DVT prophylaxis   All of the above findings and recommendations were discussed with the patient, and the medical team, and all of patient's questions were answered tohisexpressed satisfaction.   -- Lynden Oxford, PA-C [facility] 08/09/2020, 7:02 AM [phone] M-F: 7am - 4pm   Pt seen and examined. Now w small fascial dehiscence lower portion about 1.5 cms. D/w the pt in detail that we need to reinforced the fascial dehiscence, he is malnourished, persistent bowel dilation and with weak fascia. Well known risks factors for dehiscence. Procedure d/w him in detail. We will need to do it in an urgent fashion. D/W him in  detail about risks, benefits and possible complications. I offered to call his mother and pt stated that he will talk to him

## 2020-08-09 NOTE — Progress Notes (Signed)
Secure chat to Dr. [name] regarding NPO status. Patient to remain NPO aside from having a few ice chips at this time. PO meds to be changed and given per NG tube. Pharmacy notified.

## 2020-08-09 NOTE — Transfer of Care (Signed)
Immediate Anesthesia Transfer of Care Note  Patient: Thomas Rivas  Procedure(s) Performed: ABDOMINAL WOUND DEHISCENCE REPAIR (N/A Abdomen)  Patient Location: PACU  Anesthesia Type:General  Level of Consciousness: awake, alert  and oriented  Airway & Oxygen Therapy: Patient Spontanous Breathing and Patient connected to face mask oxygen  Post-op Assessment: Report given to RN and Post -op Vital signs reviewed and stable  Post vital signs: Reviewed and stable  Last Vitals:  Vitals Value Taken Time  BP 123/89 08/09/20 1150  Temp    Pulse 98 08/09/20 1153  Resp 12 08/09/20 1153  SpO2 100 % 08/09/20 1153  Vitals shown include unvalidated device data.  Last Pain:  Vitals:   08/09/20 1010  TempSrc: Tympanic  PainSc: 0-No pain      Patients Stated Pain Goal: 3 (08/08/20 2205)  Complications: No complications documented.

## 2020-08-09 NOTE — Consult Note (Signed)
PHARMACY - TOTAL PARENTERAL NUTRITION CONSULT NOTE   Indication: Small bowel obstruction  Patient Measurements: Height: 5\' 10"  (177.8 cm) Weight: 64 kg (141 lb 1.5 oz) IBW/kg (Calculated) : 73 TPN AdjBW (KG): 65.5 Body mass index is 20.24 kg/m. Usual Weight: 64 kg  Assessment: [name] is a 24 y.o. 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  Triglycerides are back below 400 today after removing lipids again from the formulation Glucose / Insulin: 136 - 283 mg/dL (17 units SSI required) - A1C 6.1 Electrolytes: WNL Renal: SCr  0.93 > 1.38 > 0.92 LFTs / TGs:          AST/ALT 46/86 (improving), Bilirubin now 1.1 mg/dL         TG April > 387 > 320 > 450 > 319 > 451 > 225 Prealbumin / albumin: albumin 3.0 > 2.1 Intake / Output; MIVF: MIVF: NS at 125 ml/hr  11/04 0701 - 11/05 0700 In: 6920.5 [P.O.:960; I.V.:5910.5; IV Piggyback:50] Out: 5975 [Urine:750; Emesis/NG output:3025; Stool:2200]  GI Imaging:  10/25 X-ray Abd: High-grade small bowel obstruction  10/26 X-ray Abd: Dilated large and small bowel loops unchanged 10/27 X-ray Abd: Multiple loops of dilated proximal mid small bowel in a pattern felt to be indicative of small bowel obstruction 10/28 X-ray Abd: persistent small bowel obstruction 10/29 X-ray Abd: Persistent severely dilated loops of small bowel noted consistent with small bowel obstruction 11/3 X-ray abd: Marked diffuse small bowel dilatation, with slight improvement in distal small bowel obstruction versus postoperative adynamic ileus. Surgeries / Procedures:  7 Days Post-Op s/p exploratory laparotomy with extensive lysis of adhesions 11/5: urgent surgery for small fascial dehiscence   Central access: PICC line placed 08/01/20 TPN start date: 08/01/20  Nutritional Goals (per RD  recommendation on 10/27): kCal: 2100-2400kcal/day, Protein: 110-120g/day, Fluid: 2.0-2.3L/day Goal TPN rate is 90 mL/hr (provides ~110 g of protein and ~ 2400 kcals per day)  Current Nutrition:  NPO  Plan:   continue current TPN without lipids at 75 mL/hr: (total volume 1900 mL)  Protein: 119.7 g  Dextrose 17%; 306 g  Lipids: none  kCal 1519  Electrolytes in TPN: 75 mEq/L of Na, 30 mEq/L of K, 5 mEq/L of Ca, 5 mEq/L of Mg, and 5 mmol/L of Phos. Cl:Ac ratio 2.12:1   Add standard MVI and trace elements to TPN  continue SSI at sensitive scale (0-9 units) q4h  Continue regular insulin 20 units to TPN bag  MIVF: continue NS at 125 ml/hr per surgery team  Monitor TPN labs on Mon/Thurs: CMP, Mg, Phosphorus,TG -- Will recheck labs in AM.    11/27, PharmD, BCPS Clinical Pharmacist 08/09/2020 7:02 AM

## 2020-08-09 NOTE — Op Note (Addendum)
  07/28/2020 - 08/09/2020  11:40 AM  PATIENT:  Thomas Rivas  24 y.o. male  PRE-OPERATIVE DIAGNOSIS:  Facial Dehiscence   POST-OPERATIVE DIAGNOSIS:  Same  PROCEDURE:  1. Closure of fascial dehiscence with both internal retention sutures and external retention sutures   FINDINGS: 1.  Lower segment of fascial dehiscence on the lower third measuring 1.5 cm,  At the level of the prior end ileostomy on the same side.  The suture had pulled through the weak fascia.  Found intact knots from prior PDS closure and intact suture. 2.  No evidence of bowel injuries. 3.  Widely patent ileostomy with bilious output   SURGEON:  Surgeon(s) and Role:    * Lillyanne Bradburn F, MD - Primary  Assistant: Shirley Muscat PA-C required for exposure and due to the complexity of the case and lack of first assistant  ANESTHESIA: GETA  EBL: 10cc  DICTATION:  Patient was explayined about the  Procedure in detail. Risks, benefits, and  possible complications and a consent was obtained. The patient taken to the operating room and placed in the supine position.  Inspection again confirmed the hesitance on lower abdomen at the level of his prior right-sided end ileostomy.  We noticed that there was very weak fascia on that area and that the PDS suture had pulled through. We isolated the ileostomy with a 4 x 4 and a Tegaderm, prior to that I was able to digitalize the ileostomy is widely patent with bilious output. No evidence of any bowel injuries or infection.  Aspirate some ascitic fluid from the abdomen. Decided to reinforce the closure with internal retention sutures and external retention sutures.  Prior to that I injected Liposomal Marcaine around the wound site and abdominal wall under direct visualization.   Multiple 0 PDS sutures were placed after I perform random cutaneous flaps to visualize the fascia much better and to be able to take bigger bites.  My bites were at least 1 cm from the fascial edge  and we identified good fascia.  In addition to this multiple interrupted internal sutures I also decided to place 2 external retention sutures to reinforce her closure.  Under direct visualization we were able to place full-thickness bites with a #2 nylon suture within the lower incision.  Prior to tying the external retention sutures also close the skin on this lower portion measuring about 7 cm so we can facilitate the placement of the external retention sutures. External retention sutures were tied down in the standard fashion.  Very meticulous with the placement of all these reinforcement sutures for both internal and external retentions and we visualize no evidence of bowel injuries.  The upper two thirds of the incisions were less with a wet-to-dry Kerlix. We also were able to redress the ileostomy in the standard fashion   Needle and laparotomy counts were correct and there were no immediate complications  Leafy Ro, MD

## 2020-08-09 NOTE — Progress Notes (Signed)
NG tube clamped from 8pm until 530am. By then, pt requested to be connected to back to LIS for slight hiccups, no nausea or abdominal distention noted, tolerating clear liquids well. Re-clamped at 0545.

## 2020-08-09 NOTE — Progress Notes (Signed)
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 Has been sinus tachycardic- per pt had this before.  [facility] hospitalist service on consult for management of medical comorbidities including lung persistent sinus tachycardia.  11/4: Patient seen and examined.  General surgery taking patient back to the operating room for fascial wound dehiscence, 1 X1 centimeter  heart rate improved over interval.  Volume status also correcting Remains on TPN Blood sugars controlled   Assessment & Plan:   Principal Problem:   SBO (small bowel obstruction) (HCC) Active Problems:   Tobacco use   Abdominal pain   Nausea & vomiting   Leukocytosis   Tachycardia   Hypernatremia   Protein-calorie malnutrition, severe  Sinus tachycardia Improved, likely owing to acute illness and volume depletion Seems to be slightly improved since TPN start Patient remains markedly net negative (~11L since admission) Recs: Continue IVF and TPN per gen surg Attempt to correct volume status (difficult as patient drinks large amts of water, then requests suction for bloating) Continue beta blocker  Hyperglycemia Diabetes coordinator following Insulin correction with TPN  Tobacco use Nicotine patch  SBO Management per gen surg  HTN Mainly diastolic htn noted Ensure pain control PRN IV hydralazine ordered  Thank you for allowing [place] to participate in the care of your patient. [facility]  hospitalist service to sign off at this time Please feel free to reach out with any questions   DVT prophylaxis: Lovenox Code Status: Full Family Communication: None today Disposition Plan: Per primary general surgery team   Consultants:   Hospitalist  Procedures:   Ex lap  Antimicrobials:   None   Subjective: Seen and examined.  Expected midline incisional soreness.  Objective: Vitals:   08/09/20 0412 08/09/20 0600 08/09/20 0731 08/09/20 1010  BP: 122/87  131/88 132/78  Pulse: (!) 101  95 87  Resp: 18  18 18   Temp: 98.1 F (36.7 C)  98.4 F (36.9 C) (!) 96.3 F (35.7 C)  TempSrc: Oral  Oral Tympanic  SpO2: 100%  100% 100%  Weight:  57.2 kg    Height:        Intake/Output Summary (Last 24 hours) at 08/09/2020 1055 Last data filed at 08/09/2020 1038 Gross per 24 hour  Intake 7848.95 ml  Output 5505 ml  Net 2343.95 ml   Filed Weights   08/03/20 0432 08/04/20 0341 08/09/20 0600  Weight: 61.1 kg 61.3 kg 57.2 kg    Examination: General exam: Appears calm and comfortable  Respiratory system: Clear to auscultation. Respiratory effort normal. Cardiovascular system: S1 & S2 heard, RRR. No JVD, murmurs, rubs, gallops or clicks. No pedal edema. Gastrointestinal system: NG tube in place.  Abdomen mild TTP around surgical incision.    1 X1 centimeter fascial dehiscence. Ileostomy right lower quadrant gas and stool in bag Central nervous system: Alert and oriented. No focal neurological deficits. Extremities: Symmetric 5 x 5 power. Skin: No rashes, lesions or ulcers Psychiatry: Judgement and insight appear normal. Mood & affect appropriate.       Data Reviewed: I have personally reviewed following labs and  imaging studies  CBC: Recent Labs  Lab 08/04/20 0402 08/05/20 0340  WBC 14.1* 12.9*  HGB 9.8* 10.2*  HCT 31.1* 32.6*  MCV 72.0* 72.3*  PLT 124* 137*   Basic Metabolic Panel: Recent Labs  Lab 08/03/20 0504 08/04/20 0402 08/05/20 0340  08/06/20 0422 08/07/20 0422 08/08/20 0230 08/09/20 0530  NA 144   < > 143 136 138 135 136  K 4.5   < > 3.7 4.9 4.5 4.0 3.9  CL 110   < > 103 93* 96* 92* 99  CO2 27   < > 30 28 30 30 27   GLUCOSE 250*   < > 232* 359* 158* 243* 136*  BUN 35*   < > 25* 47* 45* 55* 43*  CREATININE 1.11   < > 0.93 1.38* 1.08 1.11 0.92  CALCIUM 7.1*   < > 7.9* 8.8* 8.6* 8.6* 8.7*  MG 2.0  --  2.8* 2.9* 2.7* 2.7* 2.3  PHOS 2.7  --  3.0 4.6 3.9 3.8  --    < > = values in this interval not displayed.   GFR: Estimated Creatinine Clearance: 99.3 mL/min (by C-G formula based on SCr of 0.92 mg/dL). Liver Function Tests: Recent Labs  Lab 08/05/20 0340 08/08/20 0230 08/09/20 0530  AST 26 111* 46*  ALT 18 89* 86*  ALKPHOS 56 158* 154*  BILITOT 0.6 1.6* 1.1  PROT 6.2* 7.9 7.2  ALBUMIN 2.1* 3.1* 3.0*   No results for input(s): LIPASE, AMYLASE in the last 168 hours. No results for input(s): AMMONIA in the last 168 hours. Coagulation Profile: No results for input(s): INR, PROTIME in the last 168 hours. Cardiac Enzymes: No results for input(s): CKTOTAL, CKMB, CKMBINDEX, TROPONINI in the last 168 hours. BNP (last 3 results) No results for input(s): PROBNP in the last 8760 hours. HbA1C: No results for input(s): HGBA1C in the last 72 hours. CBG: Recent Labs  Lab 08/08/20 2341 08/09/20 0409 08/09/20 0503 08/09/20 0736 08/09/20 1009  GLUCAP 188* 152* 152* 116* 133*   Lipid Profile: Recent Labs    08/08/20 0230 08/09/20 0530  TRIG 451* 225*   Thyroid Function Tests: No results for input(s): TSH, T4TOTAL, FREET4, T3FREE, THYROIDAB in the last 72 hours. Anemia Panel: No results for input(s): VITAMINB12, FOLATE, FERRITIN, TIBC, IRON, RETICCTPCT in the last 72 hours. Sepsis Labs: No results for input(s): PROCALCITON, LATICACIDVEN in the last 168 hours.  Recent Results (from the past 240 hour(s))  Culture, blood (single) w Reflex to ID Panel     Status: None   Collection Time: 08/03/20  2:44 PM    Specimen: BLOOD  Result Value Ref Range Status   Specimen Description BLOOD BLOOD LEFT HAND  Final   Special Requests   Final    BOTTLES DRAWN AEROBIC AND ANAEROBIC Blood Culture results may not be optimal due to an inadequate volume of blood received in culture bottles   Culture   Final    NO GROWTH 5 DAYS Performed at [facility], [address], [place], [place]    Report Status 08/08/2020 FINAL  Final  Culture, blood (single) w Reflex to ID Panel     Status: None   Collection Time: 08/03/20  3:27 PM   Specimen: BLOOD  Result Value Ref Range Status   Specimen Description   Final    BLOOD Blood Culture results may not be optimal due to an inadequate volume of blood received in culture bottles   Special Requests   Final  BOTTLES DRAWN AEROBIC AND ANAEROBIC BLOOD LEFT HAND   Culture   Final    NO GROWTH 5 DAYS Performed at [facility], [address] [place] [place], [place] [address]    Report Status 08/08/2020 FINAL  Final  Urine Culture     Status: None   Collection Time: 08/03/20  6:41 PM   Specimen: Urine, Random  Result Value Ref Range Status   Specimen Description   Final    URINE, RANDOM Performed at [facility], [address] [place], [place] [address]    Special Requests   Final    NONE Performed at [facility], [address], [place], [place] [address]    Culture   Final    NO GROWTH Performed at [facility], [address], [place], [place] [address]    Report Status 08/05/2020 FINAL  Final         Radiology Studies: DG Abd 2 Views  Result Date: 08/08/2020 CLINICAL DATA:  Small-bowel obstruction.  Colostomy.  NG tube. EXAM: ABDOMEN - 2 VIEW COMPARISON:  08/07/2020.  CT 07/28/2020. FINDINGS: NG tube noted with tip over the stomach. Side hole is just above the gastroesophageal junction. NG tube advancement of approximately 10 cm should be considered. Multiple dilated loops of small bowel again noted.  Again small-bowel obstruction could present this fashion. Similar findings on prior exam. Colostomy noted over the right lower quadrant. Free air again cannot be excluded. Barium noted in the rectum IMPRESSION: 1. NG tube noted with tip over the stomach. Side hole is just above the gastroesophageal junction. NG tube advancement of approximately 10 cm should be considered. 2. Persistent small bowel obstruction. Free air again cannot be excluded. Electronically Signed   By: Maisie Fus  Register   On: 08/08/2020 08:05   DG Abd Portable 1V  Result Date: 08/09/2020 CLINICAL DATA:  Ileus. EXAM: PORTABLE ABDOMEN - 1 VIEW COMPARISON:  August 08, 2020. FINDINGS: Dilated small bowel loops are again noted concerning for ileus or distal small bowel obstruction. Ostomy is noted in the right lower quadrant. Residual contrast is seen in the rectum. Nasogastric tube tip is seen in proximal stomach. IMPRESSION: Dilated small bowel loops are again noted concerning for ileus or distal small bowel obstruction. Electronically Signed   By: Lupita Raider M.D.   On: 08/09/2020 09:47        Scheduled Meds: . [MAR Hold] chlorhexidine  15 mL Mouth Rinse BID  . [MAR Hold] Chlorhexidine Gluconate Cloth  6 each Topical Daily  . [MAR Hold] insulin aspart  0-9 Units Subcutaneous Q4H  . [MAR Hold] ketorolac  30 mg Intravenous Q6H  . [MAR Hold] lidocaine  2 patch Transdermal Q24H  . [MAR Hold] lidocaine-EPINEPHrine  20 mL Intradermal Once  . [MAR Hold] mouth rinse  15 mL Mouth Rinse q12n4p  . [MAR Hold] pantoprazole (PROTONIX) IV  40 mg Intravenous Q24H  . [MAR Hold] propranolol  15 mg Oral TID   Continuous Infusions: . sodium chloride 125 mL/hr at 08/09/20 0907  . [MAR Hold] cefoTEtan (CEFOTAN) IV    . [MAR Hold] chlorproMAZINE (THORAZINE) IV Stopped (08/08/20 2232)  . TPN ADULT (ION) 75 mL/hr at 08/09/20 0834  . TPN ADULT (ION)       LOS: 12 days    Time spent: 15 minutes    Tresa Moore, MD [facility] Pager [phone]  If 7PM-7AM, please contact night-coverage 08/09/2020, 10:55 AM

## 2020-08-09 NOTE — Anesthesia Preprocedure Evaluation (Signed)
Anesthesia Evaluation  Patient identified by MRN, date of birth, ID band Patient awake    Reviewed: Allergy & Precautions, H&P , NPO status , Patient's Chart, lab work & pertinent test results  History of Anesthesia Complications Negative for: history of anesthetic complications  Airway Mallampati: II  TM Distance: >3 FB Neck ROM: full    Dental  (+) Chipped   Pulmonary neg pulmonary ROS, neg sleep apnea, neg COPD, Current Smoker,    breath sounds clear to auscultation       Cardiovascular (-) angina(-) Past MI and (-) Cardiac Stents negative cardio ROS  (-) dysrhythmias  Rhythm:regular Rate:Tachycardia     Neuro/Psych negative neurological ROS  negative psych ROS   GI/Hepatic Neg liver ROS, H/o colectomy, now with SBO likely from adhesions. NG with copious output, ileostomy with less output. Failed medical management. Presenting for laparotomy.   Endo/Other  negative endocrine ROS  Renal/GU      Musculoskeletal   Abdominal   Peds  Hematology negative hematology ROS (+)   Anesthesia Other Findings NGT in place TPN hanging DL PICC Tachycardic to 448J, will give 1L bolus LR prior to induction  Past Medical History: No date: Hirschsprung's disease  Past Surgical History: 12/21/2019: COLON SURGERY     Comment:  abdominal colectomy w/ileostomy 12/25/2019: EXPLORATORY LAPAROTOMY 12/31/2019: GASTROJEJUNOSTOMY  BMI    Body Mass Index: 18.98 kg/m      Reproductive/Obstetrics negative OB ROS                             Anesthesia Physical  Anesthesia Plan  ASA: III and emergent  Anesthesia Plan: General ETT, Rapid Sequence and Cricoid Pressure   Post-op Pain Management:    Induction: Intravenous  PONV Risk Score and Plan: Ondansetron, Dexamethasone and Treatment may vary due to age or medical condition  Airway Management Planned: Oral ETT  Additional Equipment:    Intra-op Plan:   Post-operative Plan: Extubation in OR  Informed Consent: I have reviewed the patients History and Physical, chart, labs and discussed the procedure including the risks, benefits and alternatives for the proposed anesthesia with the patient or authorized representative who has indicated his/her understanding and acceptance.     Dental Advisory Given  Plan Discussed with: Anesthesiologist, CRNA and Surgeon  Anesthesia Plan Comments: (Patient consented for risks of anesthesia including but not limited to:  - adverse reactions to medications - damage to eyes, teeth, lips or other oral mucosa - nerve damage due to positioning  - sore throat or hoarseness - Damage to heart, brain, nerves, lungs, other parts of body or loss of life  Patient voiced understanding.)        Anesthesia Quick Evaluation

## 2020-08-09 NOTE — Anesthesia Postprocedure Evaluation (Signed)
Anesthesia Post Note  Patient: Thomas Rivas  Procedure(s) Performed: ABDOMINAL WOUND DEHISCENCE REPAIR (N/A Abdomen)  Patient location during evaluation: PACU Anesthesia Type: General Level of consciousness: awake and alert Pain management: pain level controlled Vital Signs Assessment: post-procedure vital signs reviewed and stable Respiratory status: spontaneous breathing, nonlabored ventilation, respiratory function stable and patient connected to nasal cannula oxygen Cardiovascular status: blood pressure returned to baseline and stable Postop Assessment: no apparent nausea or vomiting Anesthetic complications: no   No complications documented.   Last Vitals:  Vitals:   08/09/20 1220 08/09/20 1246  BP: 125/90 (!) 132/91  Pulse: 100 100  Resp: 14 18  Temp: 36.6 C 36.7 C  SpO2: 100% 100%    Last Pain:  Vitals:   08/09/20 1246  TempSrc: Oral  PainSc:                  Cleda Mccreedy Helayna Dun

## 2020-08-10 ENCOUNTER — Inpatient Hospital Stay: Payer: Medicaid Other

## 2020-08-10 ENCOUNTER — Encounter: Payer: Self-pay | Admitting: Surgery

## 2020-08-10 LAB — COMPREHENSIVE METABOLIC PANEL
ALT: 73 U/L — ABNORMAL HIGH (ref 0–44)
AST: 38 U/L (ref 15–41)
Albumin: 2.4 g/dL — ABNORMAL LOW (ref 3.5–5.0)
Alkaline Phosphatase: 146 U/L — ABNORMAL HIGH (ref 38–126)
Anion gap: 8 (ref 5–15)
BUN: 27 mg/dL — ABNORMAL HIGH (ref 6–20)
CO2: 24 mmol/L (ref 22–32)
Calcium: 8.3 mg/dL — ABNORMAL LOW (ref 8.9–10.3)
Chloride: 104 mmol/L (ref 98–111)
Creatinine, Ser: 0.92 mg/dL (ref 0.61–1.24)
GFR, Estimated: >60 mL/min (ref >60)
Glucose, Bld: 118 mg/dL — ABNORMAL HIGH (ref 70–99)
Potassium: 4.4 mmol/L (ref 3.5–5.1)
Sodium: 136 mmol/L (ref 135–145)
Total Bilirubin: 0.9 mg/dL (ref 0.3–1.2)
Total Protein: 6.4 g/dL — ABNORMAL LOW (ref 6.5–8.1)

## 2020-08-10 LAB — CBC
HCT: 30.7 % — ABNORMAL LOW (ref 39.0–52.0)
Hemoglobin: 9.5 g/dL — ABNORMAL LOW (ref 13.0–17.0)
MCH: 22.2 pg — ABNORMAL LOW (ref 26.0–34.0)
MCHC: 30.9 g/dL (ref 30.0–36.0)
MCV: 71.9 fL — ABNORMAL LOW (ref 80.0–100.0)
Platelets: 646 10*3/uL — ABNORMAL HIGH (ref 150–400)
RBC: 4.27 MIL/uL (ref 4.22–5.81)
RDW: 18.6 % — ABNORMAL HIGH (ref 11.5–15.5)
WBC: 20 10*3/uL — ABNORMAL HIGH (ref 4.0–10.5)
nRBC: 0 % (ref 0.0–0.2)

## 2020-08-10 LAB — GLUCOSE, CAPILLARY
Glucose-Capillary: 102 mg/dL — ABNORMAL HIGH (ref 70–99)
Glucose-Capillary: 109 mg/dL — ABNORMAL HIGH (ref 70–99)
Glucose-Capillary: 110 mg/dL — ABNORMAL HIGH (ref 70–99)
Glucose-Capillary: 114 mg/dL — ABNORMAL HIGH (ref 70–99)
Glucose-Capillary: 126 mg/dL — ABNORMAL HIGH (ref 70–99)
Glucose-Capillary: 89 mg/dL (ref 70–99)

## 2020-08-10 LAB — MAGNESIUM: Magnesium: 2.3 mg/dL (ref 1.7–2.4)

## 2020-08-10 LAB — TRIGLYCERIDES: Triglycerides: 170 mg/dL — ABNORMAL HIGH (ref <150)

## 2020-08-10 MED ORDER — TRAVASOL 10 % IV SOLN
INTRAVENOUS | Status: AC
  Filled 2020-08-10: qty 1209.6

## 2020-08-10 MED ORDER — SODIUM CHLORIDE 0.9 % IV SOLN: INTRAVENOUS | Status: DC

## 2020-08-10 NOTE — Consult Note (Addendum)
PHARMACY - TOTAL PARENTERAL NUTRITION CONSULT NOTE   Indication: Small bowel obstruction  Patient Measurements: Height: 5\' 10"  (177.8 cm) Weight: 64 kg (141 lb 1.5 oz) IBW/kg (Calculated) : 73 TPN AdjBW (KG): 65.5 Body mass index is 20.24 kg/m. Usual Weight: 64 kg  Assessment: [name] is a 24 y.o. 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  Triglycerides are back below 400 today after removing lipids again from the formulation Glucose / Insulin: 136 - 283 mg/dL (2 units SSI required) - A1C 6.1 Electrolytes: WNL Renal: SCr  0.93 > 1.38 > 0.92 > 0.92 LFTs / TGs:          AST/ALT 38/73 (improving), Bilirubin now 0.9 mg/dL         TG April > 782 > 320 > 450 > 319 > 451 > 225 > 170 Prealbumin / albumin: albumin 3.0 > 2.1 > 2.4 Intake / Output; MIVF: MIVF: NS at 120 ml/hr  11/04 0701 - 11/05 0700 In: 6920.5 [P.O.:960; I.V.:5910.5; IV Piggyback:50] Out: 5975 [Urine:750; Emesis/NG output:3025; Stool:2200]  GI Imaging:  10/25 X-ray Abd: High-grade small bowel obstruction  10/26 X-ray Abd: Dilated large and small bowel loops unchanged 10/27 X-ray Abd: Multiple loops of dilated proximal mid small bowel in a pattern felt to be indicative of small bowel obstruction 10/28 X-ray Abd: persistent small bowel obstruction 10/29 X-ray Abd: Persistent severely dilated loops of small bowel noted consistent with small bowel obstruction 11/3 X-ray abd: Marked diffuse small bowel dilatation, with slight improvement in distal small bowel obstruction versus postoperative adynamic ileus. Surgeries / Procedures:  7 Days Post-Op s/p exploratory laparotomy with extensive lysis of adhesions 11/5: urgent surgery for small fascial dehiscence   Central access: PICC line placed 08/01/20 TPN start date: 08/01/20  Nutritional  Goals (per RD recommendation on 10/27): kCal: 2100-2400kcal/day, Protein: 110-120g/day, Fluid: 2.0-2.3L/day Goal TPN rate is 90 mL/hr (provides ~110 g of protein and ~ 2400 kcals per day)  Current Nutrition:  NPO  Plan:   Will partially add back lipids to current TPN and increase rate to 80 mL/hr to accomodate: (total volume 1920 mL)  Protein: 121 g  Dextrose 17%; 307 g  Lipids: 19.2g  kCal 1720.32  Electrolytes in TPN: 75 mEq/L of Na, 30 mEq/L of K, 5 mEq/L of Ca, 5 mEq/L of Mg, and 5 mmol/L of Phos. Cl:Ac ratio 1:1   Add standard MVI and trace elements to TPN  continue SSI at sensitive scale (0-9 units) q4h  Continue regular insulin 20 units to TPN bag  MIVF: reduce NS to 120 ml/hr   Monitor TPN labs on Mon/Thurs: CMP, Mg, Phosphorus,TG -- Will recheck labs in AM.    11/27, PharmD, BCPS Clinical Pharmacist 08/10/2020 7:22 AM

## 2020-08-10 NOTE — Progress Notes (Signed)
CC: SBO Subjective: POD # 1 closure fascial dehiscence Doing well  ileostomy working Labs ok BUN improving, wbc reactive  Objective: Vital signs in last 24 hours: Temp:  [98.2 F (36.8 C)-99 F (37.2 C)] 98.2 F (36.8 C) (11/06 1141) Pulse Rate:  [88-107] 101 (11/06 1141) Resp:  [16-20] 18 (11/06 1141) BP: (115-135)/(72-89) 135/89 (11/06 1141) SpO2:  [100 %] 100 % (11/06 1141) Weight:  [58.2 kg] 58.2 kg (11/06 0500) Last BM Date: 08/10/20  Intake/Output from previous day: 11/05 0701 - 11/06 0700 In: 2271.2 [I.V.:2074.7; IV Piggyback:196.5] Out: 1657 [Urine:1000; Stool:655; Blood:2] Intake/Output this shift: Total I/O In: -  Out: 175 [Stool:175]  Physical exam: NAD Abd: soft, mild distension, no peritonitis, fascia and wound intact. Ileostomy bilious fluis   Lab Results: CBC  Recent Labs    08/10/20 0534  WBC 20.0*  HGB 9.5*  HCT 30.7*  PLT 646*   BMET Recent Labs    08/09/20 0530 08/10/20 0534  NA 136 136  K 3.9 4.4  CL 99 104  CO2 27 24  GLUCOSE 136* 118*  BUN 43* 27*  CREATININE 0.92 0.92  CALCIUM 8.7* 8.3*   PT/INR No results for input(s): LABPROT, INR in the last 72 hours. ABG No results for input(s): PHART, HCO3 in the last 72 hours.  Invalid input(s): PCO2, PO2  Studies/Results: DG Abd Portable 1V  Result Date: 08/09/2020 CLINICAL DATA:  Ileus. EXAM: PORTABLE ABDOMEN - 1 VIEW COMPARISON:  August 08, 2020. FINDINGS: Dilated small bowel loops are again noted concerning for ileus or distal small bowel obstruction. Ostomy is noted in the right lower quadrant. Residual contrast is seen in the rectum. Nasogastric tube tip is seen in proximal stomach. IMPRESSION: Dilated small bowel loops are again noted concerning for ileus or distal small bowel obstruction. Electronically Signed   By: Lupita Raider M.D.   On: 08/09/2020 09:47   DG Abd Portable 2V  Result Date: 08/10/2020 CLINICAL DATA:  Ileus, follow-up, small-bowel obstruction, history of  Hirschsprung disease EXAM: PORTABLE ABDOMEN - 2 VIEW COMPARISON:  Portable exam 0708 hours compared to 08/09/2020 FINDINGS: Nasogastric tube projects over stomach. Ostomy RIGHT lower quadrant. Significant dilatation of small bowel loops in the upper and mid abdomen, largest loop slightly increased in size, favor obstruction over ileus. Few normal caliber small bowel loops are seen in the lower abdomen. Small amount retained contrast in rectum. No bowel wall thickening or free air. Osseous structures unremarkable. IMPRESSION: Increased gaseous distension of small bowel loops, favor obstruction over ileus. Electronically Signed   By: Ulyses Southward M.D.   On: 08/10/2020 12:37    Anti-infectives: Anti-infectives (From admission, onward)   Start     Dose/Rate Route Frequency Ordered Stop   08/09/20 1100  cefoTEtan (CEFOTAN) 2 g in sodium chloride 0.9 % 100 mL IVPB        2 g 200 mL/hr over 30 Minutes Intravenous Every 12 hours 08/09/20 0957 08/10/20 1024   08/02/20 1530  metroNIDAZOLE (FLAGYL) IVPB 500 mg  Status:  Discontinued        500 mg 100 mL/hr over 60 Minutes Intravenous  Once 08/02/20 1523 08/02/20 2010   08/02/20 1130  cefoTEtan (CEFOTAN) 2 g in sodium chloride 0.9 % 100 mL IVPB  Status:  Discontinued       Note to Pharmacy: On call to OR, send with the pt to the OR   2 g 200 mL/hr over 30 Minutes Intravenous Every 12 hours 08/02/20 0952 08/05/20 0857  Assessment/Plan:  Persistent expected ileus Keep NGT for now may do some sips Daily kub  Sterling Big, MD, [facility]  08/10/2020

## 2020-08-11 ENCOUNTER — Inpatient Hospital Stay: Payer: Medicaid Other

## 2020-08-11 LAB — COMPREHENSIVE METABOLIC PANEL
ALT: 43 U/L (ref 0–44)
AST: 22 U/L (ref 15–41)
Albumin: 2.1 g/dL — ABNORMAL LOW (ref 3.5–5.0)
Alkaline Phosphatase: 125 U/L (ref 38–126)
Anion gap: 6 (ref 5–15)
BUN: 22 mg/dL — ABNORMAL HIGH (ref 6–20)
CO2: 24 mmol/L (ref 22–32)
Calcium: 8.2 mg/dL — ABNORMAL LOW (ref 8.9–10.3)
Chloride: 109 mmol/L (ref 98–111)
Creatinine, Ser: 0.83 mg/dL (ref 0.61–1.24)
GFR, Estimated: >60 mL/min (ref >60)
Glucose, Bld: 95 mg/dL (ref 70–99)
Potassium: 4.6 mmol/L (ref 3.5–5.1)
Sodium: 139 mmol/L (ref 135–145)
Total Bilirubin: 0.6 mg/dL (ref 0.3–1.2)
Total Protein: 6.2 g/dL — ABNORMAL LOW (ref 6.5–8.1)

## 2020-08-11 LAB — PHOSPHORUS: Phosphorus: 3.1 mg/dL (ref 2.5–4.6)

## 2020-08-11 LAB — GLUCOSE, CAPILLARY
Glucose-Capillary: 113 mg/dL — ABNORMAL HIGH (ref 70–99)
Glucose-Capillary: 115 mg/dL — ABNORMAL HIGH (ref 70–99)
Glucose-Capillary: 122 mg/dL — ABNORMAL HIGH (ref 70–99)
Glucose-Capillary: 123 mg/dL — ABNORMAL HIGH (ref 70–99)
Glucose-Capillary: 126 mg/dL — ABNORMAL HIGH (ref 70–99)
Glucose-Capillary: 137 mg/dL — ABNORMAL HIGH (ref 70–99)

## 2020-08-11 LAB — TRIGLYCERIDES: Triglycerides: 148 mg/dL (ref <150)

## 2020-08-11 MED ORDER — FUROSEMIDE 10 MG/ML IJ SOLN
40.0000 mg | Freq: Once | INTRAMUSCULAR | Status: AC
  Administered 2020-08-11: 40 mg via INTRAVENOUS
  Filled 2020-08-11: qty 4

## 2020-08-11 MED ORDER — HYDROMORPHONE HCL 1 MG/ML IJ SOLN
1.0000 mg | INTRAMUSCULAR | Status: AC
  Administered 2020-08-11: 1 mg via INTRAVENOUS
  Filled 2020-08-11: qty 1

## 2020-08-11 MED ORDER — ENOXAPARIN SODIUM 40 MG/0.4ML ~~LOC~~ SOLN
40.0000 mg | SUBCUTANEOUS | Status: DC
  Administered 2020-08-11 – 2020-08-12 (×2): 40 mg via SUBCUTANEOUS
  Filled 2020-08-11 (×2): qty 0.4

## 2020-08-11 MED ORDER — SODIUM CHLORIDE 0.9 % IV SOLN: INTRAVENOUS | Status: DC

## 2020-08-11 MED ORDER — LIDOCAINE-EPINEPHRINE 1 %-1:100000 IJ SOLN
30.0000 mL | Freq: Once | INTRAMUSCULAR | Status: AC
  Administered 2020-08-11: 30 mL via INTRADERMAL
  Filled 2020-08-11: qty 30

## 2020-08-11 MED ORDER — METOPROLOL TARTRATE 5 MG/5ML IV SOLN
5.0000 mg | Freq: Four times a day (QID) | INTRAVENOUS | Status: DC | PRN
  Administered 2020-08-11 – 2020-08-17 (×4): 5 mg via INTRAVENOUS
  Filled 2020-08-11 (×4): qty 5

## 2020-08-11 MED ORDER — TRAVASOL 10 % IV SOLN
INTRAVENOUS | Status: AC
  Filled 2020-08-11: qty 1188

## 2020-08-11 MED ORDER — FENTANYL 12 MCG/HR TD PT72
2.0000 | MEDICATED_PATCH | TRANSDERMAL | Status: DC
  Administered 2020-08-12 – 2020-08-18 (×3): 2 via TRANSDERMAL
  Filled 2020-08-11 (×5): qty 1
  Filled 2020-08-11: qty 2

## 2020-08-11 NOTE — Progress Notes (Signed)
CC: SBO Subjective: S/p laparotomy w fascial dehiscence. Ostomy is working but he is distended and having hiccups. I flushed the NGT and it was not working . I was able to get it to work by flushing it and removing the cap attached to the slumping port. We got back about 1.4 lts and he felt better  Objective: Vital signs in last 24 hours: Temp:  [98.4 F (36.9 C)-99.2 F (37.3 C)] 98.4 F (36.9 C) (11/07 1146) Pulse Rate:  [92-125] 112 (11/07 1146) Resp:  [14-16] 16 (11/07 1146) BP: (127-144)/(82-95) 144/95 (11/07 1146) SpO2:  [100 %] 100 % (11/07 1146) Weight:  [58 kg] 58 kg (11/07 0500) Last BM Date: 08/11/20  Intake/Output from previous day: 11/06 0701 - 11/07 0700 In: 3920.8 [I.V.:3617.3; IV Piggyback:303.5] Out: 1675 [Urine:1100; Stool:575] Intake/Output this shift: Total I/O In: -  Out: 200 [Urine:200]  Physical exam:  NAD alert Abd: soft, ileostomy with bilious output. Midline wound with retention sutures lower incision, his abdomen is distended some serous fluid. Open wound with intact fascia  On upper level. Under sterile technique I used lidocaine 1% w epi and close a segment of skin measuring 3 cms , I reinforced with a single stitch 0 pds a portion of his fascia,  this is in an effort to distribute the tension that his distension is causing on his abdominal wall . His abdomen is tense, but not peritonitic  Lab Results: CBC  Recent Labs    08/10/20 0534  WBC 20.0*  HGB 9.5*  HCT 30.7*  PLT 646*   BMET Recent Labs    08/10/20 0534 08/11/20 0525  NA 136 139  K 4.4 4.6  CL 104 109  CO2 24 24  GLUCOSE 118* 95  BUN 27* 22*  CREATININE 0.92 0.83  CALCIUM 8.3* 8.2*   PT/INR No results for input(s): LABPROT, INR in the last 72 hours. ABG No results for input(s): PHART, HCO3 in the last 72 hours.  Invalid input(s): PCO2, PO2  Studies/Results: DG Abd Portable 2V  Result Date: 08/10/2020 CLINICAL DATA:  Ileus, follow-up, small-bowel obstruction,  history of Hirschsprung disease EXAM: PORTABLE ABDOMEN - 2 VIEW COMPARISON:  Portable exam 0708 hours compared to 08/09/2020 FINDINGS: Nasogastric tube projects over stomach. Ostomy RIGHT lower quadrant. Significant dilatation of small bowel loops in the upper and mid abdomen, largest loop slightly increased in size, favor obstruction over ileus. Few normal caliber small bowel loops are seen in the lower abdomen. Small amount retained contrast in rectum. No bowel wall thickening or free air. Osseous structures unremarkable. IMPRESSION: Increased gaseous distension of small bowel loops, favor obstruction over ileus. Electronically Signed   By: Ulyses Southward M.D.   On: 08/10/2020 12:37    Anti-infectives: Anti-infectives (From admission, onward)   Start     Dose/Rate Route Frequency Ordered Stop   08/09/20 1100  cefoTEtan (CEFOTAN) 2 g in sodium chloride 0.9 % 100 mL IVPB        2 g 200 mL/hr over 30 Minutes Intravenous Every 12 hours 08/09/20 0957 08/10/20 1024   08/02/20 1530  metroNIDAZOLE (FLAGYL) IVPB 500 mg  Status:  Discontinued        500 mg 100 mL/hr over 60 Minutes Intravenous  Once 08/02/20 1523 08/02/20 2010   08/02/20 1130  cefoTEtan (CEFOTAN) 2 g in sodium chloride 0.9 % 100 mL IVPB  Status:  Discontinued       Note to Pharmacy: On call to OR, send with the pt to the OR  2 g 200 mL/hr over 30 Minutes Intravenous Every 12 hours 08/02/20 0952 08/05/20 0857      Assessment/Plan:  SBO, persistent distension Abdominal binder he still a high risk for dehiscence and if he does it again we may need mesh reinforcement. Nutritional status is very poor and his fascia is tenderness. I am going to diurese him today in hopes to relieve some of the intra-abdominal pressure. He needs to be strict n.p.o. and compliant with NG tube suction. Discussed with the patient at length about importance of being compliant. If he dehisced once again we may need to take him back and put mesh, this is not simple  since his ileostomy os to the right of the abdominal wall. THis is a very complex scenario and let's hope that his closure will hold.  Continue TPN and NGT    Sterling Big, MD, [facility]  08/11/2020

## 2020-08-11 NOTE — Consult Note (Signed)
PHARMACY - TOTAL PARENTERAL NUTRITION CONSULT NOTE   Indication: Small bowel obstruction  Patient Measurements: Height: 5\' 10"  (177.8 cm) Weight: 64 kg (141 lb 1.5 oz) IBW/kg (Calculated) : 73 TPN AdjBW (KG): 65.5 Body mass index is 20.24 kg/m. Usual Weight: 64 kg (current 58kg)  Assessment: Thomas Rivas is a 24 y.o. 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  Triglycerides are back below 400 today after removing lipids again from the formulation  Glucose / Insulin: 89 - 126 mg/dL (2 units SSI required) - A1C 6.1  Electrolytes: WNL  Renal: SCr  0.93 > 1.38 > 0.92 > 0.92  LFTs / TGs:          AST/ALT 22/43 (improving), Bilirubin now 0.9 mg/dL         TG April > 329 > 320 > 450 > 319 > 451 > 225 > 170 >148  Prealbumin / albumin: albumin 3.0 > 2.1 > 2.4  Intake / Output; MIVF: MIVF: NS at 120 ml/hr  11/06 0701 - 11/07 0700 In: 3920 [P.O.:0; I.V.:3617; IV PG 303.5] Out: 2245.8 [Urine:1100; Emesis/NG output:0; Stool/Ileostomy:1150]   GI Imaging:  10/25 X-ray Abd: High-grade small bowel obstruction  10/26 X-ray Abd: Dilated large and small bowel loops unchanged 10/27 X-ray Abd: Multiple loops of dilated proximal mid small bowel in a pattern felt to be indicative of small bowel obstruction 10/28 X-ray Abd: persistent small bowel obstruction 10/29 X-ray Abd: Persistent severely dilated loops of small bowel noted consistent with small bowel obstruction 11/3 X-ray abd: Marked diffuse small bowel dilatation, with slight improvement in distal small bowel obstruction versus postoperative adynamic ileus. Surgeries / Procedures:  7 Days Post-Op s/p exploratory laparotomy with extensive lysis of adhesions 11/5: urgent surgery for small fascial dehiscence   Central access: PICC line placed 08/01/20 TPN start  date: 08/01/20  Nutritional Goals (per RD recommendation on 10/27): kCal: 2100-2400kcal/day, Protein: 110-120g/day, Fluid: 2.0-2.3L/day Goal TPN rate is 90 mL/hr (provides ~110 g of protein and ~ 2100 kcals per day)  Current Nutrition:  NPO  Plan:   Will increase lipids in current TPN and increase rate to 90 mL/hr to accomodate: (total volume 2160 mL)  Protein: 118 g  Dextrose 14%; 302 g  Lipids: 64.8g  kCal 2151  Electrolytes in TPN: 75 mEq/L of Na, 30 mEq/L of K, 5 mEq/L of Ca, 5 mEq/L of Mg, and 5 mmol/L of Phos. Cl:Ac ratio 1:1   Add standard MVI and trace elements to TPN  continue SSI at sensitive scale (0-9 units) q4h  Continue regular insulin 20 units to TPN bag  MIVF: reduce NS to 110 ml/hr   Monitor TPN labs on Mon/Thurs: CMP, Mg, Phosphorus,TG -- Will recheck labs in AM.    2152, PharmD, BCPS Clinical Pharmacist 08/11/2020 7:31 AM

## 2020-08-11 NOTE — Progress Notes (Signed)
Patient heart rate elevated 132, prn metoprolol given as ordered, heart rate 114. Patient requested water to drink, Patient was educated that he was nothing by mouth per MD order.  Anselm Jungling

## 2020-08-12 ENCOUNTER — Inpatient Hospital Stay: Payer: Medicaid Other

## 2020-08-12 ENCOUNTER — Encounter: Payer: Self-pay | Admitting: Internal Medicine

## 2020-08-12 LAB — PREALBUMIN: Prealbumin: 9.5 mg/dL — ABNORMAL LOW (ref 18–38)

## 2020-08-12 LAB — COMPREHENSIVE METABOLIC PANEL
ALT: 31 U/L (ref 0–44)
AST: 17 U/L (ref 15–41)
Albumin: 2 g/dL — ABNORMAL LOW (ref 3.5–5.0)
Alkaline Phosphatase: 115 U/L (ref 38–126)
Anion gap: 7 (ref 5–15)
BUN: 19 mg/dL (ref 6–20)
CO2: 25 mmol/L (ref 22–32)
Calcium: 8.1 mg/dL — ABNORMAL LOW (ref 8.9–10.3)
Chloride: 106 mmol/L (ref 98–111)
Creatinine, Ser: 0.67 mg/dL (ref 0.61–1.24)
GFR, Estimated: >60 mL/min (ref >60)
Glucose, Bld: 132 mg/dL — ABNORMAL HIGH (ref 70–99)
Potassium: 4 mmol/L (ref 3.5–5.1)
Sodium: 138 mmol/L (ref 135–145)
Total Bilirubin: 0.5 mg/dL (ref 0.3–1.2)
Total Protein: 6.3 g/dL — ABNORMAL LOW (ref 6.5–8.1)

## 2020-08-12 LAB — TRIGLYCERIDES: Triglycerides: 146 mg/dL (ref <150)

## 2020-08-12 LAB — GLUCOSE, CAPILLARY
Glucose-Capillary: 111 mg/dL — ABNORMAL HIGH (ref 70–99)
Glucose-Capillary: 118 mg/dL — ABNORMAL HIGH (ref 70–99)
Glucose-Capillary: 129 mg/dL — ABNORMAL HIGH (ref 70–99)
Glucose-Capillary: 134 mg/dL — ABNORMAL HIGH (ref 70–99)
Glucose-Capillary: 139 mg/dL — ABNORMAL HIGH (ref 70–99)
Glucose-Capillary: 141 mg/dL — ABNORMAL HIGH (ref 70–99)

## 2020-08-12 LAB — CBC
HCT: 26.1 % — ABNORMAL LOW (ref 39.0–52.0)
Hemoglobin: 8.4 g/dL — ABNORMAL LOW (ref 13.0–17.0)
MCH: 22.7 pg — ABNORMAL LOW (ref 26.0–34.0)
MCHC: 32.2 g/dL (ref 30.0–36.0)
MCV: 70.5 fL — ABNORMAL LOW (ref 80.0–100.0)
Platelets: 906 10*3/uL (ref 150–400)
RBC: 3.7 MIL/uL — ABNORMAL LOW (ref 4.22–5.81)
RDW: 18.9 % — ABNORMAL HIGH (ref 11.5–15.5)
WBC: 21.4 10*3/uL — ABNORMAL HIGH (ref 4.0–10.5)
nRBC: 0 % (ref 0.0–0.2)

## 2020-08-12 LAB — MAGNESIUM: Magnesium: 1.7 mg/dL (ref 1.7–2.4)

## 2020-08-12 LAB — PHOSPHORUS: Phosphorus: 3.7 mg/dL (ref 2.5–4.6)

## 2020-08-12 MED ORDER — ENOXAPARIN SODIUM 40 MG/0.4ML ~~LOC~~ SOLN
40.0000 mg | SUBCUTANEOUS | Status: DC
  Administered 2020-08-13 – 2020-08-25 (×13): 40 mg via SUBCUTANEOUS
  Filled 2020-08-12 (×14): qty 0.4

## 2020-08-12 MED ORDER — FUROSEMIDE 10 MG/ML IJ SOLN
40.0000 mg | Freq: Once | INTRAMUSCULAR | Status: AC
  Administered 2020-08-12: 40 mg via INTRAVENOUS
  Filled 2020-08-12: qty 4

## 2020-08-12 MED ORDER — PIPERACILLIN-TAZOBACTAM 3.375 G IVPB
3.3750 g | Freq: Three times a day (TID) | INTRAVENOUS | Status: AC
  Administered 2020-08-12 – 2020-08-25 (×41): 3.375 g via INTRAVENOUS
  Filled 2020-08-12 (×42): qty 50

## 2020-08-12 MED ORDER — IOHEXOL 9 MG/ML PO SOLN
500.0000 mL | ORAL | Status: AC
  Administered 2020-08-12 (×2): 500 mL via ORAL

## 2020-08-12 MED ORDER — SODIUM CHLORIDE 0.9% FLUSH
5.0000 mL | Freq: Three times a day (TID) | INTRAVENOUS | Status: DC
  Administered 2020-08-12 – 2020-08-26 (×35): 5 mL

## 2020-08-12 MED ORDER — SODIUM CHLORIDE 0.9 % IV SOLN
INTRAVENOUS | Status: DC | PRN
  Administered 2020-08-12 – 2020-08-19 (×4): 250 mL via INTRAVENOUS

## 2020-08-12 MED ORDER — FENTANYL CITRATE (PF) 100 MCG/2ML IJ SOLN
INTRAMUSCULAR | Status: AC | PRN
Start: 2020-08-12 — End: 2020-08-12
  Administered 2020-08-12: 25 ug via INTRAVENOUS

## 2020-08-12 MED ORDER — IOHEXOL 300 MG/ML  SOLN
100.0000 mL | Freq: Once | INTRAMUSCULAR | Status: AC | PRN
  Administered 2020-08-12: 100 mL via INTRAVENOUS

## 2020-08-12 MED ORDER — HYDROCODONE-ACETAMINOPHEN 5-325 MG PO TABS: 1.0000 | ORAL_TABLET | ORAL | Status: DC | PRN

## 2020-08-12 MED ORDER — TRAVASOL 10 % IV SOLN
INTRAVENOUS | Status: AC
  Filled 2020-08-12: qty 1188

## 2020-08-12 MED ORDER — MAGNESIUM SULFATE 2 GM/50ML IV SOLN
2.0000 g | Freq: Once | INTRAVENOUS | Status: AC
  Administered 2020-08-12: 2 g via INTRAVENOUS
  Filled 2020-08-12: qty 50

## 2020-08-12 MED ORDER — FENTANYL CITRATE (PF) 100 MCG/2ML IJ SOLN
INTRAMUSCULAR | Status: AC
  Filled 2020-08-12: qty 2

## 2020-08-12 MED ORDER — MIDAZOLAM HCL 5 MG/5ML IJ SOLN
INTRAMUSCULAR | Status: AC
  Filled 2020-08-12: qty 5

## 2020-08-12 MED ORDER — MIDAZOLAM HCL 5 MG/5ML IJ SOLN
INTRAMUSCULAR | Status: AC | PRN
  Administered 2020-08-12: 1 mg via INTRAVENOUS

## 2020-08-12 NOTE — Consult Note (Signed)
Chief Complaint: Leukocytosis found to have a intra abdominal abscess. Request is for intra abdominal abscess drain placement.   Referring Physician(s): Yves Dill PA  Supervising Physician: Oley Balm  Patient Status: [facility] - In-pt  History of Present Illness: Thomas Rivas is a 24 y.o. 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 Admitted for SBO s/p lap with lysis of adhesion on 10.29.21 and facial dehiscence repair on 11.5.21. Due to ongoing leukocytosis a CT abdomen pelvis was performed on 11.8.21 that reads Interval development of a large fluid collection involving the peritoneal space which measures 14.8 x 8.2 cm transversely in the pelvis. A portion of it extends into both pericolic gutters, and this is concerning for abscess, as it does contain gas. Team is requesting an intra abdominal abscess drain placement.  Past Medical History:  Diagnosis Date  . Hirschsprung's disease     Past Surgical History:  Procedure Laterality Date  . ABDOMINAL WOUND DEHISCENCE N/A 08/09/2020   Procedure: ABDOMINAL WOUND DEHISCENCE REPAIR;  Surgeon: Leafy Ro, MD;  Location: [facility] ORS;  Service: General;  Laterality: N/A;  . COLON SURGERY  12/21/2019   abdominal colectomy w/ileostomy  . EXPLORATORY LAPAROTOMY  12/25/2019  . GASTROJEJUNOSTOMY  12/31/2019  . LAPAROTOMY N/A 08/02/2020   Procedure: EXPLORATORY LAPAROTOMY;  Surgeon: Leafy Ro, MD;  Location: [facility] ORS;  Service: General;  Laterality: N/A;    Allergies: Lactose intolerance (gi)  Medications: Prior to Admission medications   Medication Sig Start Date End Date Taking? Authorizing Provider  Ensure (ENSURE) Take 237 mLs by mouth.    [provider]     History reviewed. No pertinent family history.  Social History   Socioeconomic History  . Marital status: Single    Spouse name: Not on file  .  Number of children: Not on file  . Years of education: Not on file  . Highest education level: Not on file  Occupational History  . Not on file  Tobacco Use  . Smoking status: Current Some Day Smoker    Types: Cigarettes  . Smokeless tobacco: Never Used  . Tobacco comment: 1-2 cigarettes 2-3 days per week.  Vaping Use  . Vaping Use: Never used  Substance and Sexual Activity  . Alcohol use: Not Currently  . Drug use: Yes    Types: Marijuana  . Sexual activity: Not on file  Other Topics Concern  . Not on file  Social History Narrative  . Not on file   Social Determinants of Health   Financial Resource Strain:   . Difficulty of Paying Living Expenses: Not on file  Food Insecurity:   . Worried About Programme researcher, broadcasting/film/video in the Last Year: Not on file  . Ran Out of Food in the Last Year: Not on file  Transportation Needs:   . Lack of Transportation (Medical): Not on file  . Lack of Transportation (Non-Medical): Not on file  Physical Activity:   . Days of Exercise per Week: Not on file  . Minutes of Exercise per Session: Not on file  Stress:   . Feeling of Stress : Not on file  Social Connections:   . Frequency of Communication with Friends and Family: Not on file  . Frequency of Social Gatherings with Friends and Family: Not on file  . Attends Religious Services: Not on file  . Active Member of Clubs or Organizations: Not on file  .  Attends Banker Meetings: Not on file  . Marital Status: Not on file     Review of Systems: A 12 point ROS discussed and pertinent positives are indicated in the HPI above.  All other systems are negative.  Review of Systems  Constitutional: Negative for fever.  HENT: Negative for congestion.   Respiratory: Negative for cough and shortness of breath.   Cardiovascular: Negative for chest pain.  Gastrointestinal: Negative for abdominal pain.  Neurological: Negative for headaches.  Psychiatric/Behavioral: Negative for behavioral  problems and confusion.    Vital Signs: BP 128/82 (BP Location: Left Arm)   Pulse (!) 116   Temp 98.4 F (36.9 C) (Oral)   Resp 18   Ht 5\' 10"  (1.778 m)   Wt 127 lb 13.9 oz (58 kg)   SpO2 100%   BMI 18.35 kg/m   Physical Exam Vitals and nursing note reviewed.  Constitutional:      Appearance: He is well-developed.  HENT:     Head: Normocephalic.  Cardiovascular:     Rate and Rhythm: Tachycardia present.  Pulmonary:     Effort: Pulmonary effort is normal.  Abdominal:     Comments:  NG tube present. Ostomy bag present.  Abdominal dressing c/d/i.   Musculoskeletal:        General: Normal range of motion.     Cervical back: Normal range of motion.  Skin:    General: Skin is dry.  Neurological:     Mental Status: He is alert and oriented to person, place, and time.     Imaging: CT ABDOMEN PELVIS W CONTRAST  Result Date: 08/12/2020 CLINICAL DATA:  Leukocytosis after surgery. EXAM: CT ABDOMEN AND PELVIS WITH CONTRAST TECHNIQUE: Multidetector CT imaging of the abdomen and pelvis was performed using the standard protocol following bolus administration of intravenous contrast. CONTRAST:  13/05/2020 OMNIPAQUE IOHEXOL 300 MG/ML  SOLN COMPARISON:  July 28, 2020. FINDINGS: Lower chest: Small right pleural effusion is noted with adjacent right basilar atelectasis. Hepatobiliary: No focal liver abnormality is seen. No gallstones, gallbladder wall thickening, or biliary dilatation. Pancreas: Unremarkable. No pancreatic ductal dilatation or surrounding inflammatory changes. Spleen: Normal in size without focal abnormality. Adrenals/Urinary Tract: Adrenal glands are unremarkable. Kidneys are normal, without renal calculi, focal lesion, or hydronephrosis. Bladder is unremarkable. Stomach/Bowel: Moderate size sliding-type hiatal hernia is noted. Nasogastric tube tip is seen in proximal stomach. Mild gastric distention is noted. Small bowel dilatation remains with the distal small bowel being  nondilated, concerning for some degree of distal small bowel obstruction, although the exact transition zone could not be identified. Status post total colectomy with ostomy seen in the right lower quadrant. Vascular/Lymphatic: No significant vascular findings are present. No enlarged abdominal or pelvic lymph nodes. Reproductive: Prostate is unremarkable. Other: There is the interval development of a large fluid collection involving the peritoneal space which measures 14.8 x 8.2 cm transversely in the pelvis. A portion of it extends into both pericolic gutters, and this is concerning for abscess, as it does contain gas. Musculoskeletal: No acute or significant osseous findings. IMPRESSION: 1. Interval development of a large fluid collection involving the peritoneal space which measures 14.8 x 8.2 cm transversely in the pelvis. A portion of it extends into both pericolic gutters, and this is concerning for abscess, as it does contain gas. These results will be called to the ordering clinician or representative by the Radiologist Assistant, and communication documented in the PACS or zVision Dashboard. 2. Status post total colectomy with ostomy  seen in the right lower quadrant. Small bowel dilatation remains with the distal small bowel being nondilated, concerning for some degree of distal small bowel obstruction, although the exact transition zone could not be identified. 3. Moderate size sliding-type hiatal hernia. 4. Small right pleural effusion is noted with adjacent right basilar atelectasis. Electronically Signed   By: Lupita Raider M.D.   On: 08/12/2020 13:10   CT Abdomen Pelvis W Contrast  Result Date: 07/28/2020 CLINICAL DATA:  Acute generalized abdominal pain. EXAM: CT ABDOMEN AND PELVIS WITH CONTRAST TECHNIQUE: Multidetector CT imaging of the abdomen and pelvis was performed using the standard protocol following bolus administration of intravenous contrast. CONTRAST:  OMNIPAQUE IOHEXOL 300  MG/ML  SOLN COMPARISON:  Feb 22, 2020. FINDINGS: Lower chest: No acute abnormality. Hepatobiliary: No focal liver abnormality is seen. No gallstones, gallbladder wall thickening, or biliary dilatation. Pancreas: Unremarkable. No pancreatic ductal dilatation or surrounding inflammatory changes. Spleen: Normal in size without focal abnormality. Adrenals/Urinary Tract: Adrenal glands and kidneys are unremarkable. No hydronephrosis or renal obstruction is noted. Urinary bladder is not well visualized secondary to scatter artifact arising from what appears to be residual contrast in the rectum. Stomach/Bowel: Status post total colectomy with ostomy seen in the right lower quadrant. There is interval development of gastric distention and severe small bowel dilatation most consistent with distal obstruction. Transition zone appears to be in the right upper quadrant best seen on image number 16 of series 6. As noted above, there appears to be a large amount of residual contrast in the residual rectum. Vascular/Lymphatic: No significant vascular findings are present. No enlarged abdominal or pelvic lymph nodes. Reproductive: Prostate is unremarkable. Other: No abdominal wall hernia or abnormality. No abdominopelvic ascites. Musculoskeletal: No acute or significant osseous findings. IMPRESSION: Status post total colectomy with ostomy seen in the right lower quadrant. There is interval development of gastric distention and severe small bowel dilatation most consistent with distal small bowel obstruction. Transition zone appears to be in the right upper quadrant best seen on image number 16 of series 6. Electronically Signed   By: Lupita Raider M.D.   On: 07/28/2020 09:55   DG Chest Port 1 View  Result Date: 07/30/2020 CLINICAL DATA:  Tachycardia EXAM: PORTABLE CHEST 1 VIEW COMPARISON:  CT 07/28/2020, abdominal radiograph 07/30/2020 FINDINGS: Transesophageal tube tip coiling in the left upper quadrant with the side port  beyond the GE junction. No consolidation, features of edema, pneumothorax, or effusion. The cardiomediastinal contours are unremarkable. Multiple air distended loops of bowel seen in the upper abdomen compatible with a previously demonstrated bowel obstruction. No other acute or suspicious osseous or soft tissue abnormality. IMPRESSION: 1. Transesophageal tube tip coiling in the left upper quadrant with the side port beyond the GE junction. 2. Multiple air distended loops of bowel in the upper abdomen compatible with a previously demonstrated bowel obstruction. 3. No acute cardiopulmonary abnormality. Electronically Signed   By: Kreg Shropshire M.D.   On: 07/30/2020 20:50   DG Abd 2 Views  Result Date: 08/08/2020 CLINICAL DATA:  Small-bowel obstruction.  Colostomy.  NG tube. EXAM: ABDOMEN - 2 VIEW COMPARISON:  08/07/2020.  CT 07/28/2020. FINDINGS: NG tube noted with tip over the stomach. Side hole is just above the gastroesophageal junction. NG tube advancement of approximately 10 cm should be considered. Multiple dilated loops of small bowel again noted. Again small-bowel obstruction could present this fashion. Similar findings on prior exam. Colostomy noted over the right lower quadrant. Free air  again cannot be excluded. Barium noted in the rectum IMPRESSION: 1. NG tube noted with tip over the stomach. Side hole is just above the gastroesophageal junction. NG tube advancement of approximately 10 cm should be considered. 2. Persistent small bowel obstruction. Free air again cannot be excluded. Electronically Signed   By: Maisie Fus  Register   On: 08/08/2020 08:05   DG Abd 2 Views  Result Date: 08/07/2020 CLINICAL DATA:  Follow-up small bowel obstruction. Laparotomy 08/02/2020. EXAM: ABDOMEN - 2 VIEW COMPARISON:  08/06/2020 abdominal radiograph FINDINGS: Enteric tube terminates in the body of the stomach with side port near the esophagogastric junction. Marked diffuse small bowel dilatation with scattered small  air-fluid levels, mildly decreased in degree of dilatation. Colostomy overlies the right lower quadrant. Retained barium in rectum. Right upper quadrant Rigler's sign again noted indicative of free air. No radiopaque nephrolithiasis. IMPRESSION: 1. Enteric tube terminates in the body of the stomach with side port near the esophagogastric junction. 2. Marked diffuse small bowel dilatation, mildly decreased, compatible with slight improvement in distal small bowel obstruction versus postoperative adynamic ileus. 3. Stable right upper quadrant Rigler's sign indicative of free air, presumably due to recent surgery. Electronically Signed   By: Delbert Phenix M.D.   On: 08/07/2020 08:40   DG Abd 2 Views  Result Date: 08/02/2020 CLINICAL DATA:  Small-bowel obstruction. EXAM: ABDOMEN - 2 VIEW COMPARISON:  08/01/2020. FINDINGS: NG tube noted with its tip in the stomach. Persistent severely dilated loops of small bowel noted. No interim change from prior exam. No free air noted. Ostomy noted over the right lower quadrant. Contrast again noted in the rectum thoracolumbar spine scoliosis. IMPRESSION: NG tube noted with its tip in the stomach. Persistent severely dilated loops of small bowel noted consistent with small bowel obstruction. No interim change from prior exam. Electronically Signed   By: Maisie Fus  Register   On: 08/02/2020 07:17   DG Abd 2 Views  Result Date: 08/01/2020 CLINICAL DATA:  Small-bowel obstruction, abdominal distension, patient reports feeling less distended and less tight, slightly better EXAM: ABDOMEN - 2 VIEW COMPARISON:  07/31/2020 FINDINGS: Nasogastric tube coiled in proximal stomach, stomach decompressed. Marked distention of small bowel loops throughout the upper and mid abdomen consistent with high-grade small bowel obstruction, unchanged. Small amount of gas and contrast in rectum. Otherwise no colonic gas identified. No definite bowel wall thickening or free air. Osseous structures  unremarkable. Visualized lung bases clear. IMPRESSION: Persistent high-grade small bowel obstruction. Electronically Signed   By: Ulyses Southward M.D.   On: 08/01/2020 08:09   DG ABD ACUTE 2+V W 1V CHEST  Result Date: 07/31/2020 CLINICAL DATA:  Small bowel obstruction. EXAM: DG ABDOMEN ACUTE WITH 1 VIEW CHEST COMPARISON:  Chest radiograph and abdomen radiograph July 30, 2020 FINDINGS: AP chest: Lungs are clear. Heart size and pulmonary vascularity are normal. No adenopathy. Nasogastric tube tip and side port in stomach. Supine and upright abdomen: Nasogastric tube tip and side port in stomach. There are multiple loops of dilated small bowel in a pattern indicative of a degree of bowel obstruction. No free air appreciable. Contrast seen in the rectum. IMPRESSION: Nasogastric tube tip and side port in stomach. Multiple loops of dilated proximal mid small bowel in a pattern felt to be indicative of small bowel obstruction. No evident free air. Lungs clear. Electronically Signed   By: Bretta Bang III M.D.   On: 07/31/2020 08:31   DG ABD ACUTE 2+V W 1V CHEST  Result Date: 07/30/2020 CLINICAL  DATA:  Small-bowel obstruction EXAM: DG ABDOMEN ACUTE WITH 1 VIEW CHEST COMPARISON:  07/29/2020 FINDINGS: Persistent marked small bowel dilatation throughout the abdomen. No gross free intraperitoneal air. 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 Normal heart size. Clear lungs. IMPRESSION: Persistent high-grade small bowel obstruction. Electronically Signed   By: Duanne Guess D.O.   On: 07/30/2020 08:31   DG ABD ACUTE 2+V W 1V CHEST  Result Date: 07/29/2020 CLINICAL DATA:  Small bowel obstruction. 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 EXAM: DG ABDOMEN ACUTE WITH 1 VIEW CHEST COMPARISON:  Abdominal CT from yesterday FINDINGS: History of colectomy and ileostomy. Marked small bowel dilatation which was also seen on prior. High-density contrast is seen within the patient's mucous fistula.  No apparent pneumoperitoneum on the upright view. Low volume but clear lungs. Normal heart size IMPRESSION: High-grade small bowel obstruction. Electronically Signed   By: Marnee Spring M.D.   On: 07/29/2020 10:36   DG Abd Portable 1V  Result Date: 08/09/2020 CLINICAL DATA:  Ileus. EXAM: PORTABLE ABDOMEN - 1 VIEW COMPARISON:  August 08, 2020. FINDINGS: Dilated small bowel loops are again noted concerning for ileus or distal small bowel obstruction. Ostomy is noted in the right lower quadrant. Residual contrast is seen in the rectum. Nasogastric tube tip is seen in proximal stomach. IMPRESSION: Dilated small bowel loops are again noted concerning for ileus or distal small bowel obstruction. Electronically Signed   By: Lupita Raider M.D.   On: 08/09/2020 09:47   DG Abd Portable 1V  Result Date: 08/06/2020 CLINICAL DATA:  Small-bowel obstruction EXAM: PORTABLE ABDOMEN - 1 VIEW COMPARISON:  08/02/2020. FINDINGS: NG tube noted with its tip over the upper portion of the stomach. Advancement of approximately 10 cm should be considered. Persistent severely dilated loops of small bowel noted bowel dilatation up to 6 cm noted. Air is noted within the colon on today's exam. Right hemidiaphragm incompletely visualized. No free air 5. Ostomy noted over the right lower quadrant. Barium noted in the rectum. No free air identified. No acute bony abnormality identified. IMPRESSION: 1. NG tube noted with its tip over the upper portion of the stomach. Advancement of approximately 10 cm should be considered. 2. Persistent severely dilated loops of small bowel again noted. Bowel dilatation up to 6 cm noted. Air noted within the colon on today's exam. No free air identified. Electronically Signed   By: Maisie Fus  Register   On: 08/06/2020 07:50   DG Abd Portable 1V-Small Bowel Obstruction Protocol-initial, 8 hr delay  Result Date: 08/01/2020 CLINICAL DATA:  Small-bowel obstruction, 8 hour delay film. EXAM: PORTABLE ABDOMEN  - 1 VIEW COMPARISON:  August 01, 2020 FINDINGS: Numerous dilated small bowel loops are again seen throughout the abdomen. These are unchanged in caliber when compared to the prior study. Air and radiopaque contrast is again seen within the rectum. A paucity of bowel gas is again noted throughout the large bowel which is not clearly identified. No radio-opaque calculi or other significant radiographic abnormality are seen. IMPRESSION: Findings consistent with persistent small bowel obstruction. Electronically Signed   By: Aram Candela M.D.   On: 08/01/2020 19:20   DG Abd Portable 1V  Result Date: 07/30/2020 CLINICAL DATA:  NG tube placement EXAM: PORTABLE ABDOMEN - 1 VIEW COMPARISON:  07/30/2020 FINDINGS: NG tube coiled in the stomach with the tip near the GE junction. Dilated large and small bowel loops unchanged from the prior study. IMPRESSION: NG tube coiled in the stomach with the tip in the  gastric fundus Dilated large and small bowel loops unchanged. Electronically Signed   By: Charles  Clark M.D.   On: 10/26/202Marlan Palau1 11:54   DG Abd Portable 2V  Result Date: 08/10/2020 CLINICAL DATA:  Ileus, follow-up, small-bowel obstruction, history of Hirschsprung disease EXAM: PORTABLE ABDOMEN - 2 VIEW COMPARISON:  Portable exam 0708 hours compared to 08/09/2020 FINDINGS: Nasogastric tube projects over stomach. Ostomy RIGHT lower quadrant. Significant dilatation of small bowel loops in the upper and mid abdomen, largest loop slightly increased in size, favor obstruction over ileus. Few normal caliber small bowel loops are seen in the lower abdomen. Small amount retained contrast in rectum. No bowel wall thickening or free air. Osseous structures unremarkable. IMPRESSION: Increased gaseous distension of small bowel loops, favor obstruction over ileus. Electronically Signed   By: Ulyses SouthwardMark  Boles M.D.   On: 08/10/2020 12:37   US EKG SITE RITE  Result Date: 07/31/2020 If Site Rite image not attached, placement  could not be confirmed due to current cardiac rhythm.   Labs:  CBC: Recent Labs    08/04/20 0402 08/05/20 0340 08/10/20 0534 08/12/20 0505  WBC 14.1* 12.9* 20.0* 21.4*  HGB 9.8* 10.2* 9.5* 8.4*  HCT 31.1* 32.6* 30.7* 26.1*  PLT 124* 137* 646* 906*    COAGS: Recent Labs    07/28/20 2106  INR 1.1    BMP: Recent Labs    08/09/20 0530 08/10/20 0534 08/11/20 0525 08/12/20 0505  NA 136 136 139 138  K 3.9 4.4 4.6 4.0  CL 99 104 109 106  CO2 27 24 24 25   GLUCOSE 136* 118* 95 132*  BUN 43* 27* 22* 19  CALCIUM 8.7* 8.3* 8.2* 8.1*  CREATININE 0.92 0.92 0.83 0.67  GFRNONAA >60 >60 >60 >60    LIVER FUNCTION TESTS: Recent Labs    08/09/20 0530 08/10/20 0534 08/11/20 0525 08/12/20 0505  BILITOT 1.1 0.9 0.6 0.5  AST 46* 38 22 17  ALT 86* 73* 43 31  ALKPHOS 154* 146* 125 115  PROT 7.2 6.4* 6.2* 6.3*  ALBUMIN 3.0* 2.4* 2.1* 2.0*     Assessment and Plan: 24 y.o. male inpatient. 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 Admitted for SBO s/p lap with lysis of adhesion on 10.29.21 and facial dehiscence repair on 11.5.21. Due to ongoing leukocytosis a CT abdomen pelvis was performed on 11.8.21 that reads Interval development of a large fluid collection involving the peritoneal space which measures 14.8 x 8.2 cm transversely in the pelvis. A portion of it extends into both pericolic gutters, and this is concerning for abscess, as it does contain gas. Team is requesting an intra abdominal abscess drain placement.  WBC is 21.4. Patient is afebrile.Patient is lovenox prophylactic dosage last dose given on 11.8.21 @ 08:31. All other labs and medications are within acceptable parameters.  No pertinent allergies. Per bedside RN patient has been NPO.  IR consulted for possible liver abscess drain placement. Case has been reviewed and procedure approved by Dr. Deanne CofferHassell.  Patient tentatively  scheduled for 11.8.21.  Team instructed to: Keep Patient to be NPO after midnight Hold prophylactic anticoagulation 24 hours prior to scheduled procedure.  IR will call patient when ready.  Risks and benefits discussed with the patient including bleeding, infection, damage to adjacent structures, bowel perforation/fistula connection, and sepsis.  All of the patient's questions were answered, patient is agreeable to proceed. Consent signed and in chart.   Thank you for this  interesting consult.  I greatly enjoyed meeting [facility] and look forward to participating in their care.  A copy of this report was sent to the requesting provider on this date.  Electronically Signed: Alene Mires, NP 08/12/2020, 2:04 PM   I spent a total of 40 Minutes    in face to face in clinical consultation, greater than 50% of which was counseling/coordinating care for intra abdominal abscess drain placement.

## 2020-08-12 NOTE — Procedures (Signed)
  Procedure: CT drain placement LLQ collection 42ml thin blood tinged sent for GS, C&S EBL:   minimal Complications:  none immediate  See full dictation in [employer].  Thora Lance MD Main # [phone] Pager  [phone] Mobile [phone]

## 2020-08-12 NOTE — Consult Note (Signed)
PHARMACY - TOTAL PARENTERAL NUTRITION CONSULT NOTE   Indication: Small bowel obstruction  Patient Measurements: Height: 5\' 10"  (177.8 cm) Weight: 64 kg (141 lb 1.5 oz) IBW/kg (Calculated) : 73 TPN AdjBW (KG): 65.5 Body mass index is 20.24 kg/m. Usual Weight: 64 kg (current 58kg)  Assessment: Thomas Rivas is a 24 y.o. 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  Triglycerides are back wnl  Glucose / Insulin: 895 - 141 mg/dL (4 units SSI required) - A1C 6.1  Electrolytes: borderline hypomagnesemia, other electroltes WNL  Renal: SCr  < 1, stable  LFTs / TGs:          AST/ALT WNL, Bilirubin down to 0.5 mg/dL         TG April 284-->  Prealbumin / albumin: albumin 3.0 > 2.1 > 2.4  Intake / Output; MIVF: MIVF: NS at 120 ml/hr  11/07 0701 - 11/08 0700 In: 3010.7 [I.V.:2929.5; IV Piggyback:81.3] Out: 3600 [Urine:2300; Emesis/NG output:1000; Stool:300]   GI Imaging:  10/25 X-ray Abd: High-grade small bowel obstruction  10/26 X-ray Abd: Dilated large and small bowel loops unchanged 10/27 X-ray Abd: Multiple loops of dilated proximal mid small bowel in a pattern felt to be indicative of small bowel obstruction 10/28 X-ray Abd: persistent small bowel obstruction 10/29 X-ray Abd: Persistent severely dilated loops of small bowel noted consistent with small bowel obstruction 11/3 X-ray abd: Marked diffuse small bowel dilatation, with slight improvement in distal small bowel obstruction versus postoperative adynamic ileus. Surgeries / Procedures:  8 Days Post-Op s/p exploratory laparotomy with extensive lysis of adhesions 3 Days Post-Op s/p closure of fascial dehiscence with internal and external retention sutrures  Central access: PICC line placed 08/01/20 TPN start date: 08/01/20  Nutritional Goals  (per RD recommendation on 10/27): kCal: 2100-2400kcal/day, Protein: 110-120g/day, Fluid: 2.0-2.3L/day Goal TPN rate is 90 mL/hr (provides ~110 g of protein and ~ 2100 kcals per day)  Current Nutrition:  NPO  Plan:   Continue current TPN at 90 mL/hr: (total volume 2260 mL)  Protein: 118.8 g  Dextrose 302 g  Lipids: 64.8g  kCal 2151  Electrolytes in TPN: 75 mEq/L of Na, 40 mEq/L of K, 5 mEq/L of Ca, 5 mEq/L of Mg, and 5 mmol/L of Phos. Cl:Ac ratio 1:1   2 grams IV magnesium sulfate x 1  Add standard MVI and trace elements to TPN  continue SSI at sensitive scale (0-9 units) q4h  Continue regular insulin 20 units to TPN bag  MIVF: continue NS at 50 ml/hr   Monitor TPN labs on Mon/Thurs: CMP, Mg, Phosphorus,TG -- Will recheck labs in AM.    2152, PharmD, BCPS Clinical Pharmacist 08/12/2020 7:03 AM

## 2020-08-12 NOTE — Progress Notes (Signed)
[facility] SURGICAL PROGRESS NOTE  Hospital Day(s): 15.   Post op day(s): 3 Days Post-Op.   Interval History:  Patient seen and examined Overnight had issues with tachycardia again, metoprolol given but remained tachycardic to 120s.  This morning patient reports, he is doing well, tired, no complaints No nausea or emesis, continues to hiccups  Leukocytosis worsening, up to 21K, no fevers Renal function remains normal, sCr - 0.67, UO - 2.3L, --> appears to be diuresing well No significant electrolyte derangements  Continues to have degree of malnutrition with serum albumin at 2.0 Triglycerides appear corrected this morning NGT output recorded at 1L in last 24 hours Ileostomy output recorded at 300 ccs He has been strict bed rest as well for fascial dehiscence    Vital signs in last 24 hours: [min-max] current  Temp:  [98.4 F (36.9 C)-99.8 F (37.7 C)] 98.6 F (37 C) (11/08 0416) Pulse Rate:  [112-133] 120 (11/08 0416) Resp:  [16-20] 20 (11/08 0416) BP: (128-144)/(76-97) 137/97 (11/08 0416) SpO2:  [100 %] 100 % (11/08 0416) Weight:  [58 kg] 58 kg (11/08 0416)     Height: 5\' 10"  (177.8 cm) Weight: 58 kg BMI (Calculated): 18.35   Intake/Output last 2 shifts:  11/07 0701 - 11/08 0700 In: 3010.7 [I.V.:2929.5; IV Piggyback:81.3] Out: 3600 [Urine:2300; Emesis/NG output:1000; Stool:300]   Physical Exam:  Constitutional: alert, cooperative and no distress HEENT:NGT in place, clamped Respiratory: breathing non-labored at rest  Cardiovascular: Tachycardia and sinus rhythm  Gastrointestinal:Soft, expected incisional soreness, no appreciable distension, no rebound/guarding, ileostomy in the RLQ with gas and stool in bag Integumentary:Midline laparotomy healing via secondary intention, the inferior half of this incision hs retention sutures present both internal and external, the superior portion is granulation tissue  Labs:  CBC Latest Ref Rng & Units 08/12/2020  08/10/2020 08/05/2020  WBC 4.0 - 10.5 K/uL 21.4(H) 20.0(H) 12.9(H)  Hemoglobin 13.0 - 17.0 g/dL 13/10/2019) 1.9(J) 10.2(L)  Hematocrit 39 - 52 % 26.1(L) 30.7(L) 32.6(L)  Platelets 150 - 400 K/uL 906(HH) 646(H) 137(L)   CMP Latest Ref Rng & Units 08/12/2020 08/11/2020 08/10/2020  Glucose 70 - 99 mg/dL 13/03/2020) 95 267(T)  BUN 6 - 20 mg/dL 19 245(Y) 09(X)  Creatinine 0.61 - 1.24 mg/dL 83(J 8.25 0.53  Sodium 135 - 145 mmol/L 138 139 136  Potassium 3.5 - 5.1 mmol/L 4.0 4.6 4.4  Chloride 98 - 111 mmol/L 106 109 104  CO2 22 - 32 mmol/L 25 24 24   Calcium 8.9 - 10.3 mg/dL 8.1(L) 8.2(L) 8.3(L)  Total Protein 6.5 - 8.1 g/dL 6.3(L) 6.2(L) 6.4(L)  Total Bilirubin 0.3 - 1.2 mg/dL 0.5 0.6 0.9  Alkaline Phos 38 - 126 U/L 115 125 146(H)  AST 15 - 41 U/L 17 22 38  ALT 0 - 44 U/L 31 43 73(H)    Imaging studies:   CT Abdomen/Pelvis (08/12/2020) pending   Assessment/Plan:  24 y.o. male with increase in leukocytosis and tachycardia 3 Days Post-Op s/p closure of fascial dehiscence with internal and external retention sutrures 8 Days Post-Ops/p exploratory laparotomy and lysis of adhesions for small bowel obstruction secondary to internal hernia, complicated by pertinent comorbidities includings/p subtotal colectomy for reportedly Hirschsprung's disease however he does nothave a formal diagnosis of this and was pending outpatient work up with GI.   - Given increase in leukocytosis and persistent tachycardia this morning, we will plan on repeating CT Abdomen/Pelvis for reassessment.    - Continue strict NPO   - Continue to use NGT to  LIS; monitor and record output - Continue maintenance fluids; NS 100 ml/hr; he has lots of GI losses - Continue TPN today at goal rate and current formulation; lipids are under goal but he has been able to maintain triglycerides at normal levels - continue local wound care: wet to dry dressing daily + prn, rentention sutures present -  Monitor abdominal examination; on-going bowel function - Pain control prn; antiemetics prn--> I will attempt to transition to PO medications, unsure how much he will tolerate/absorb - Monitor leukocytosis; improving; likely reactive - Strict bed rest for now  - DVT prophylaxis   All of the above findings and recommendations were discussed with the patient, and the medical team, and all of patient's questions were answered tohisexpressed satisfaction.   -- Lynden Oxford, PA-C [facility] 08/12/2020, 7:15 AM [phone] M-F: 7am - 4pm

## 2020-08-12 NOTE — Progress Notes (Signed)
Styrofoam cup of water with straw present on patient counter removed, patient educated on importance of remaining NPO. Anselm Jungling

## 2020-08-12 NOTE — Progress Notes (Addendum)
MEDICATION-RELATED CONSULT NOTE   IR Procedure Consult - Anticoagulant/Antiplatelet PTA/Inpatient Med List Review by Pharmacist    Procedure:  CT drain placement LLQ collection     Completed: 11/8 @ 1524   Post-Procedural bleeding risk per IR MD assessment:  Low   Antithrombotic medications on inpatient or PTA profile prior to procedure:   Enoxaparin 40mg      Recommended restart time per IR Post-Procedure Guidelines:  Next day    Plan:     Will reorder enoxaparin 40mg  every 24 hours to be restarted 11/9 @ 1000   , PharmD, BCPS Clinical Pharmacist 08/12/2020 3:46 PM

## 2020-08-13 LAB — GLUCOSE, CAPILLARY
Glucose-Capillary: 104 mg/dL — ABNORMAL HIGH (ref 70–99)
Glucose-Capillary: 110 mg/dL — ABNORMAL HIGH (ref 70–99)
Glucose-Capillary: 115 mg/dL — ABNORMAL HIGH (ref 70–99)
Glucose-Capillary: 120 mg/dL — ABNORMAL HIGH (ref 70–99)
Glucose-Capillary: 126 mg/dL — ABNORMAL HIGH (ref 70–99)
Glucose-Capillary: 133 mg/dL — ABNORMAL HIGH (ref 70–99)
Glucose-Capillary: 138 mg/dL — ABNORMAL HIGH (ref 70–99)

## 2020-08-13 LAB — COMPREHENSIVE METABOLIC PANEL
ALT: 27 U/L (ref 0–44)
AST: 19 U/L (ref 15–41)
Albumin: 2 g/dL — ABNORMAL LOW (ref 3.5–5.0)
Alkaline Phosphatase: 125 U/L (ref 38–126)
Anion gap: 8 (ref 5–15)
BUN: 16 mg/dL (ref 6–20)
CO2: 26 mmol/L (ref 22–32)
Calcium: 8.2 mg/dL — ABNORMAL LOW (ref 8.9–10.3)
Chloride: 103 mmol/L (ref 98–111)
Creatinine, Ser: 0.79 mg/dL (ref 0.61–1.24)
GFR, Estimated: >60 mL/min (ref >60)
Glucose, Bld: 101 mg/dL — ABNORMAL HIGH (ref 70–99)
Potassium: 4.2 mmol/L (ref 3.5–5.1)
Sodium: 137 mmol/L (ref 135–145)
Total Bilirubin: 0.5 mg/dL (ref 0.3–1.2)
Total Protein: 6.7 g/dL (ref 6.5–8.1)

## 2020-08-13 LAB — MAGNESIUM: Magnesium: 1.8 mg/dL (ref 1.7–2.4)

## 2020-08-13 MED ORDER — TRAVASOL 10 % IV SOLN
INTRAVENOUS | Status: AC
  Filled 2020-08-13: qty 1188

## 2020-08-13 NOTE — Consult Note (Signed)
PHARMACY - TOTAL PARENTERAL NUTRITION CONSULT NOTE   Indication: Small bowel obstruction  Patient Measurements: Height: 5\' 10"  (177.8 cm) Weight: 64 kg (141 lb 1.5 oz) IBW/kg (Calculated) : 73 TPN AdjBW (KG): 65.5 Body mass index is 20.24 kg/m. Usual Weight: 64 kg (current 58kg)  Assessment: Thomas Rivas is a 24 y.o. 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  Triglycerides and transaminases are back wnl Glucose / Insulin: 111 - 138 mg/dL (4 units SSI required) - A1C 6.1% Electrolytes: borderline hypomagnesemia, other electroltes WNL Renal: SCr  < 1, stable LFTs / TGs:          AST/ALT WNL, Bilirubin down to 0.5 mg/dL         TG April 591--> Prealbumin / albumin: albumin 3.0 > 2.1 > 2.4 Intake / Output; MIVF: MIVF: NS at 50 ml/hr  11/08 0701 - 11/09 0700 In: 2054.9 [I.V.:1954.9; IV Piggyback:100] Out: 4075 [Urine:2050; Emesis/NG output:1750; Drains:175; Stool:100]  GI Imaging: No new pertinent imaging studies Surgeries / Procedures:  9 Days Post-Op s/p exploratory laparotomy with extensive lysis of adhesions 4 Days Post-Op s/p closure of fascial dehiscence with internal and external retention sutrures Central access: PICC line placed 08/01/20 TPN start date: 08/01/20  Nutritional Goals (per RD recommendation on 10/27): kCal: 2100-2400kcal/day, Protein: 110-120g/day, Fluid: 2.0-2.3L/day Goal TPN rate is 90 mL/hr (provides ~110 g of protein and ~ 2100 kcals per day)  Current Nutrition:  NPO  Plan:   Continue current TPN at 90 mL/hr: (total volume 2260 mL)  Protein: 118.8 g  Dextrose 302 g  Lipids: 64.8g  kCal 2151  Electrolytes in TPN: 75 mEq/L of Na, 40 mEq/L of K, 5 mEq/L of Ca, 5 mEq/L of Mg, and 5 mmol/L of Phos. Cl:Ac ratio 1:1   Add standard MVI and trace elements to  TPN  continue SSI at sensitive scale (0-9 units) q4h  Continue regular insulin 20 units to TPN bag  MIVF: continue NS at 50 ml/hr   Monitor TPN labs on Mon/Thurs: CMP, Mg, Phosphorus,TG -- Will recheck labs in AM.    2152, PharmD, BCPS Clinical Pharmacist 08/13/2020 7:08 AM

## 2020-08-13 NOTE — Progress Notes (Signed)
Nutrition Follow-up  DOCUMENTATION CODES:   Severe malnutrition in context of acute illness/injury  INTERVENTION:   TPN per pharmacy   NUTRITION DIAGNOSIS:   Severe Malnutrition related to acute illness as evidenced by moderate fat depletion, severe fat depletion, moderate muscle depletion, severe muscle depletion, 6 percent weight loss in 1 month.  GOAL:   Patient will meet greater than or equal to 90% of their needs - met with TPN   MONITOR:   Diet advancement, Labs, Weight trends, Skin, I & O's, Other (Comment) (TPN)  ASSESSMENT:   24 y.o. 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 Hypertriglyceridemia and hyperglycemia resolved with adjustments to TPN. Refeed labs stable. Pt remains NPO. NGT in place with 1786m output although pt has been non compliant with NPO. Drain with 1764moutput. UOP 205067mPer chart, pt is down 17lbs(12%) since admit but appears to be stabilizing over the past 4-5 days; RD will continue to monitor.       Medications reviewed and include: lovenox, insulin, protonix, zosyn  Labs reviewed: K 4.2 wnl, P 3.7 wnl, Mg 1.8 wnl Pre-albumin- 9.5(L)- 11/8 Triglycerides- 146- 11/8 Wbc- 21.4(H), Hgb 8.4(L), Hct 26.1(L), MCV 70.5(L), MCH 22.7(L) cbgs- 138, 110 x 24 hrs  Diet Order:   Diet Order            Diet NPO time specified  Diet effective now                EDUCATION NEEDS:   Education needs have been addressed  Skin:  Skin Assessment: Reviewed RN Assessment  Last BM:  11/9- 100m56ma ostomy  Height:   Ht Readings from Last 1 Encounters:  07/29/20 _0  (1.778 m)    Weight:   Wt Readings from Last 1 Encounters:  08/12/20 58 kg    Ideal Body  Weight:  75.45 kg  BMI:  Body mass index is 18.35 kg/m.  Estimated Nutritional Needs:   Kcal:  2100-2400kcal/day  Protein:  110-120g/day  Fluid:  2.0-2.3L/day  CaseKoleen Distance RD, LDN Please refer to [id number] RD and/or RD on-call/weekend/after hours pager

## 2020-08-13 NOTE — Progress Notes (Signed)
[facility]  Hospital Day(s): 16.   Post op day(s): 4 Days Post-Op.   Interval History:  Patient seen and examined no acute events or new complaints overnight.  He did undergo percutaneous drain placement yesterday afternoon Patient reports he is doing well, abdominal soreness No fever, chills, nausea, emesis Remains tachycardic Labs are pending this morning Good UO at 2.0L No new imaging this morning  NGT output is recorded at 1.7L in last 24 hours Percutaneous drain with 175 ccs out Ileostomy function slowing, recorded at 100 ccs Has not been mobilizing secondary to wound dehiscence   Vital signs in last 24 hours: [min-max] current  Temp:  [98.4 F (36.9 C)-99.6 F (37.6 C)] 99.2 F (37.3 C) (11/09 0515) Pulse Rate:  [116-135] 126 (11/09 0515) Resp:  [16-23] 20 (11/09 0515) BP: (110-141)/(63-84) 131/81 (11/09 0515) SpO2:  [99 %-100 %] 99 % (11/09 0515)     Height: 5\' 10"  (177.8 cm) Weight: 58 kg BMI (Calculated): 18.35   Intake/Output last 2 shifts:  11/08 0701 - 11/09 0700 In: 2054.9 [I.V.:1954.9; IV Piggyback:100] Out: 4075 [Urine:2050; Emesis/NG output:1750; Drains:175; Stool:100]   Physical Exam:  Constitutional: alert, cooperative and no distress HEENT:NGT in place Respiratory: breathing non-labored at rest  Cardiovascular: Tachycardia and sinus rhythm  Gastrointestinal:Soft, expected incisional soreness, no appreciable distension, no rebound/guarding, ileostomy in the RLQ with gas and stool in bag. JP drain in LLQ with serous appearing output Integumentary:Midline laparotomy healing via secondary intention, the inferior half of this incision hs retention sutures present both internal and external, the superior portion is granulation tissue healing via secondary intention   Labs:  CBC Latest Ref Rng & Units 08/12/2020 08/10/2020 08/05/2020  WBC 4.0 - 10.5 K/uL 21.4(H) 20.0(H) 12.9(H)  Hemoglobin 13.0 - 17.0 g/dL 13/10/2019)  5.1(V) 10.2(L)  Hematocrit 39 - 52 % 26.1(L) 30.7(L) 32.6(L)  Platelets 150 - 400 K/uL 906(HH) 646(H) 137(L)   CMP Latest Ref Rng & Units 08/12/2020 08/11/2020 08/10/2020  Glucose 70 - 99 mg/dL 13/03/2020) 95 073(X)  BUN 6 - 20 mg/dL 19 106(Y) 69(S)  Creatinine 0.61 - 1.24 mg/dL 85(I 6.27 0.35  Sodium 135 - 145 mmol/L 138 139 136  Potassium 3.5 - 5.1 mmol/L 4.0 4.6 4.4  Chloride 98 - 111 mmol/L 106 109 104  CO2 22 - 32 mmol/L 25 24 24   Calcium 8.9 - 10.3 mg/dL 8.1(L) 8.2(L) 8.3(L)  Total Protein 6.5 - 8.1 g/dL 6.3(L) 6.2(L) 6.4(L)  Total Bilirubin 0.3 - 1.2 mg/dL 0.5 0.6 0.9  Alkaline Phos 38 - 126 U/L 115 125 146(H)  AST 15 - 41 U/L 17 22 38  ALT 0 - 44 U/L 31 43 73(H)     Imaging studies: No new pertinent imaging studies   Assessment/Plan:  24 y.o. male 4 Days Post-Op s/p closure of fascial dehiscence with internal and external retention sutrures 9 Days Post-Ops/p exploratory laparotomy and lysis of adhesions for small bowel obstruction secondary to internal hernia, complicated by pertinent comorbidities includings/p subtotal colectomy for reportedly Hirschsprung's disease however he does nothave a formal diagnosis of this and was pending outpatient work up with GI.   - Continue strict NPO              - Continue to use NGT to LIS; monitor and record output - Continue maintenance fluids; NS 50 ml/hr; he has lots of GI losses -Continue TPN today at goal rate and current formulation; lipids are under goal but he has been able to maintain  triglycerides at normal levels - Continue local wound care: wet to dry dressing daily+ prn, rentention sutures present - Monitor abdominal examination; on-going bowel function  - Continue drain; record output; follow up Cx from placement  - Continue ABx for now (Zosyn) - Pain control prn; antiemetics prn - Monitor leukocytosis; morning labs - Strict bed rest for now   - DVT prophylaxis   All of the above findings and recommendations were discussed with the patient, and the medical team, and all of patient's questions were answered to her expressed satisfaction.  -- Lynden Oxford, PA-C [facility] 08/13/2020, 6:59 AM [phone] M-F: 7am - 4pm

## 2020-08-14 ENCOUNTER — Telehealth: Payer: Self-pay | Admitting: Gastroenterology

## 2020-08-14 LAB — GLUCOSE, CAPILLARY
Glucose-Capillary: 109 mg/dL — ABNORMAL HIGH (ref 70–99)
Glucose-Capillary: 109 mg/dL — ABNORMAL HIGH (ref 70–99)
Glucose-Capillary: 123 mg/dL — ABNORMAL HIGH (ref 70–99)
Glucose-Capillary: 127 mg/dL — ABNORMAL HIGH (ref 70–99)
Glucose-Capillary: 130 mg/dL — ABNORMAL HIGH (ref 70–99)
Glucose-Capillary: 137 mg/dL — ABNORMAL HIGH (ref 70–99)

## 2020-08-14 LAB — BASIC METABOLIC PANEL
Anion gap: 8 (ref 5–15)
BUN: 17 mg/dL (ref 6–20)
CO2: 25 mmol/L (ref 22–32)
Calcium: 8.2 mg/dL — ABNORMAL LOW (ref 8.9–10.3)
Chloride: 100 mmol/L (ref 98–111)
Creatinine, Ser: 0.66 mg/dL (ref 0.61–1.24)
GFR, Estimated: >60 mL/min (ref >60)
Glucose, Bld: 108 mg/dL — ABNORMAL HIGH (ref 70–99)
Potassium: 4.4 mmol/L (ref 3.5–5.1)
Sodium: 133 mmol/L — ABNORMAL LOW (ref 135–145)

## 2020-08-14 LAB — MAGNESIUM: Magnesium: 1.9 mg/dL (ref 1.7–2.4)

## 2020-08-14 LAB — CBC
HCT: 25 % — ABNORMAL LOW (ref 39.0–52.0)
Hemoglobin: 8 g/dL — ABNORMAL LOW (ref 13.0–17.0)
MCH: 22.6 pg — ABNORMAL LOW (ref 26.0–34.0)
MCHC: 32 g/dL (ref 30.0–36.0)
MCV: 70.6 fL — ABNORMAL LOW (ref 80.0–100.0)
Platelets: 1129 10*3/uL (ref 150–400)
RBC: 3.54 MIL/uL — ABNORMAL LOW (ref 4.22–5.81)
RDW: 18.5 % — ABNORMAL HIGH (ref 11.5–15.5)
WBC: 9.7 10*3/uL (ref 4.0–10.5)
nRBC: 0 % (ref 0.0–0.2)

## 2020-08-14 MED ORDER — ACETAMINOPHEN 10 MG/ML IV SOLN
1000.0000 mg | Freq: Four times a day (QID) | INTRAVENOUS | Status: AC
  Administered 2020-08-14 – 2020-08-15 (×4): 1000 mg via INTRAVENOUS
  Filled 2020-08-14 (×4): qty 100

## 2020-08-14 MED ORDER — FAMOTIDINE IN NACL 20-0.9 MG/50ML-% IV SOLN
20.0000 mg | Freq: Two times a day (BID) | INTRAVENOUS | Status: DC
  Administered 2020-08-15 – 2020-08-25 (×22): 20 mg via INTRAVENOUS
  Filled 2020-08-14 (×23): qty 50

## 2020-08-14 MED ORDER — TRAVASOL 10 % IV SOLN
INTRAVENOUS | Status: AC
  Filled 2020-08-14: qty 1188

## 2020-08-14 MED ORDER — FUROSEMIDE 10 MG/ML IJ SOLN
40.0000 mg | Freq: Once | INTRAMUSCULAR | Status: AC
  Administered 2020-08-14: 40 mg via INTRAVENOUS
  Filled 2020-08-14: qty 4

## 2020-08-14 MED ORDER — NICOTINE 21 MG/24HR TD PT24: 21.0000 mg | MEDICATED_PATCH | Freq: Every day | TRANSDERMAL | Status: DC | PRN

## 2020-08-14 NOTE — Progress Notes (Signed)
[facility] SUR[facility] PRO[facility]ESS NOTE  Hospital Day(s): 17.   Post op day(s): 5 Days Post-Op.   Interval History:  Patient seen and examined no acute events or new complaints overnight.  Patient reports he is doing okay, abdominal soreness particularly with coughing, pain medications are helping No fever, chills, nausea, emesis Remains tachycardic Previous leukocytosis is resolved, now 9.7K Hgb low, but stable at 8.0, suspect this is dilutional  BMP and electrolytes pending Good UO at 1.3L NGT output is recorded at 1.4L in last 24 hours Percutaneous drain with 95 ccs out: serous Cx from this on 11/08 growing gram + cocci in pairs, gram - rods He remains on Zosyn  Ileostomy function slowing, recorded at 525 ccs Has not been mobilizing secondary to wound dehiscence   Vital signs in last 24 hours: [min-max] current  Temp:  [98.4 F (36.9 C)-99.8 F (37.7 C)] 98.4 F (36.9 C) (11/10 0358) Pulse Rate:  [121-132] 132 (11/10 0358) Resp:  [16-18] 18 (11/10 0358) BP: (125-128)/(75-99) 125/75 (11/10 0358) SpO2:  [100 %] 100 % (11/10 0358)     Height: 5\' 10"  (177.8 cm) Weight: 58 kg BMI (Calculated): 18.35   Intake/Output last 2 shifts:  11/09 0701 - 11/10 0700 In: 5  Out: 3420 [Urine:1350; Emesis/NG output:1450; Drains:95; Stool:525]   Physical Exam:  Constitutional: alert, cooperative and no distress HEENT:NGT in place Respiratory: breathing non-labored at rest  Cardiovascular: Tachycardia and sinus rhythm  Gastrointestinal:Soft, expected incisional soreness, no appreciable distension, no rebound/guarding, ileostomy in the RLQ with gas and stool in bag. JP drain in LLQ with serous appearing output Integumentary:Midline laparotomy healing via secondary intention, the inferior half of this incision hs retention sutures present both internal and external, the superior portion is granulation tissue healing via secondary intention   Labs:  CBC Latest Ref Rng &  Units 08/12/2020 08/10/2020 08/05/2020  WBC 4.0 - 10.5 K/uL 21.4(H) 20.0(H) 12.9(H)  Hemoglobin 13.0 - 17.0 g/dL 13/10/2019) 2.9(J) 10.2(L)  Hematocrit 39 - 52 % 26.1(L) 30.7(L) 32.6(L)  Platelets 150 - 400 K/uL 906(HH) 646(H) 137(L)   CMP Latest Ref Rng & Units 08/13/2020 08/12/2020 08/11/2020  Glucose 70 - 99 mg/dL 13/04/2020) 416(S) 95  BUN 6 - 20 mg/dL 16 19 063(K)  Creatinine 0.61 - 1.24 mg/dL 16(W 1.09 3.23  Sodium 135 - 145 mmol/L 137 138 139  Potassium 3.5 - 5.1 mmol/L 4.2 4.0 4.6  Chloride 98 - 111 mmol/L 103 106 109  CO2 22 - 32 mmol/L 26 25 24   Calcium 8.9 - 10.3 mg/dL 8.2(L) 8.1(L) 8.2(L)  Total Protein 6.5 - 8.1 g/dL 6.7 6.3(L) 6.2(L)  Total Bilirubin 0.3 - 1.2 mg/dL 0.5 0.5 0.6  Alkaline Phos 38 - 126 U/L 125 115 125  AST 15 - 41 U/L 19 17 22   ALT 0 - 44 U/L 27 31 43     Imaging studies: No new pertinent imaging studies   Assessment/Plan:  24 y.o. male 5 Days Post-Op s/p closure of fascial dehiscence with internal and external retention sutrures 10 Days Post-Ops/p exploratory laparotomy and lysis of adhesions for small bowel obstruction secondary to internal hernia, complicated by pertinent comorbidities includings/p subtotal colectomy for reportedly Hirschsprung's disease however he does nothave a formal diagnosis of this and was pending outpatient work up with GI.   - Continue strict NPO              - Continue to use NGT to LIS; monitor and record output - Continue maintenance fluids; NS 50 ml/hr;  he has lots of GI losses -Continue TPN today at goal rate and current formulation; lipids are under goal but he has been able to maintain triglycerides at normal levels - Continue local wound care: wet to dry dressing daily+ prn, rentention sutures present - Monitor abdominal examination; on-going bowel function  - Continue drain; record output  - Continue ABx for now (Zosyn); follow up Cx from placement on 11/08 - Pain  control prn; antiemetics prn - Monitor leukocytosis; resolved - Strict bed rest for now  - DVT prophylaxis   All of the above findings and recommendations were discussed with the patient, and the medical team, and all of patient's questions were answered to her expressed satisfaction.  -- Lynden Oxford, PA-C  [facility] 08/14/2020, 7:19 AM [phone] M-F: 7am - 4pm

## 2020-08-14 NOTE — Telephone Encounter (Signed)
Patient had been referred for the anorectal manometry in July of 2020 as well. The procedure was scheduled, information was mailed to the patient. The procedure was not done. Unclear if it was cancelled or the patient was a no-show. Presently the patient is hospitalized. Called  GI and left a message with the CMA of Dr Tobi Bastos. Asking if the doctor still wants the patient scheduled for the anorectal manometry. Requested a call back.

## 2020-08-14 NOTE — Consult Note (Signed)
PHARMACY - TOTAL PARENTERAL NUTRITION CONSULT NOTE   Indication: Small bowel obstruction  Patient Measurements: Height: 5\' 10"  (177.8 cm) Weight: 64 kg (141 lb 1.5 oz) IBW/kg (Calculated) : 73 TPN AdjBW (KG): 65.5 Body mass index is 20.24 kg/m. Usual Weight: 64 kg (current 58kg)  Assessment: Thomas Rivas is a 24 y.o. 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  Triglycerides and transaminases are back wnl Glucose / Insulin: 104 - 133 mg/dL (1 unit SSI required) - A1C 6.1% Electrolytes: borderline hypomagnesemia, other electroltes WNL Renal: SCr  < 1, stable LFTs / TGs:          AST/ALT WNL, Bilirubin down to 0.5 mg/dL         TG April 010--> Prealbumin / albumin: albumin 3.0 > 2.1 > 2.4 Intake / Output; MIVF: MIVF: NS at 50 ml/hr  11/09 0701 - 11/10 0700 In: 5 Out: 3420 [Urine:1350; Emesis/NG output:1450; Drains:95; Stool:525]  GI Imaging: No new pertinent imaging studies Surgeries / Procedures:  10 Days Post-Op s/p exploratory laparotomy with extensive lysis of adhesions 5 Days Post-Op s/p closure of fascial dehiscence with internal and external retention sutrures Central access: PICC line placed 08/01/20 TPN start date: 08/01/20  Nutritional Goals (per RD recommendation on 10/27): kCal: 2100-2400kcal/day, Protein: 110-120g/day, Fluid: 2.0-2.3L/day Goal TPN rate is 90 mL/hr (provides ~110 g of protein and ~ 2100 kcals per day)  Current Nutrition:  NPO  Plan:   Continue current TPN at 90 mL/hr: (total volume 2260 mL)  Protein: 118.8 g  Dextrose 302 g  Lipids: 64.8g  kCal 2151  Electrolytes in TPN: 100 mEq/L of Na, 40 mEq/L of K, 5 mEq/L of Ca, 5 mEq/L of Mg, and 5 mmol/L of Phos. Cl:Ac ratio 1:1   Add standard MVI and trace elements to TPN  continue SSI at sensitive scale (0-9  units) q4h  Continue regular insulin 20 units to TPN bag  MIVF: continue NS at 50 ml/hr   Monitor TPN labs on Mon/Thurs: CMP, Mg, Phosphorus,TG -- Will recheck labs in AM.    2152, PharmD, BCPS Clinical Pharmacist 08/14/2020 7:29 AM

## 2020-08-14 NOTE — Telephone Encounter (Signed)
Hi [name], we have received a referral from Thomas Rivas for a manometry. Please advice on scheduling.

## 2020-08-15 ENCOUNTER — Inpatient Hospital Stay: Payer: Medicaid Other

## 2020-08-15 LAB — COMPREHENSIVE METABOLIC PANEL
ALT: 30 U/L (ref 0–44)
AST: 23 U/L (ref 15–41)
Albumin: 2.3 g/dL — ABNORMAL LOW (ref 3.5–5.0)
Alkaline Phosphatase: 153 U/L — ABNORMAL HIGH (ref 38–126)
Anion gap: 8 (ref 5–15)
BUN: 22 mg/dL — ABNORMAL HIGH (ref 6–20)
CO2: 28 mmol/L (ref 22–32)
Calcium: 8.7 mg/dL — ABNORMAL LOW (ref 8.9–10.3)
Chloride: 100 mmol/L (ref 98–111)
Creatinine, Ser: 0.8 mg/dL (ref 0.61–1.24)
GFR, Estimated: >60 mL/min (ref >60)
Glucose, Bld: 88 mg/dL (ref 70–99)
Potassium: 4.2 mmol/L (ref 3.5–5.1)
Sodium: 136 mmol/L (ref 135–145)
Total Bilirubin: 0.9 mg/dL (ref 0.3–1.2)
Total Protein: 8 g/dL (ref 6.5–8.1)

## 2020-08-15 LAB — GLUCOSE, CAPILLARY
Glucose-Capillary: 109 mg/dL — ABNORMAL HIGH (ref 70–99)
Glucose-Capillary: 128 mg/dL — ABNORMAL HIGH (ref 70–99)
Glucose-Capillary: 118 mg/dL — ABNORMAL HIGH (ref 70–99)
Glucose-Capillary: 103 mg/dL — ABNORMAL HIGH (ref 70–99)
Glucose-Capillary: 121 mg/dL — ABNORMAL HIGH (ref 70–99)

## 2020-08-15 LAB — TRIGLYCERIDES: Triglycerides: 89 mg/dL (ref <150)

## 2020-08-15 LAB — PHOSPHORUS: Phosphorus: 4.2 mg/dL (ref 2.5–4.6)

## 2020-08-15 LAB — MAGNESIUM: Magnesium: 2 mg/dL (ref 1.7–2.4)

## 2020-08-15 MED ORDER — METOCLOPRAMIDE HCL 5 MG/ML IJ SOLN
10.0000 mg | Freq: Three times a day (TID) | INTRAMUSCULAR | Status: DC
  Administered 2020-08-15 – 2020-08-21 (×17): 10 mg via INTRAVENOUS
  Filled 2020-08-15 (×18): qty 2

## 2020-08-15 MED ORDER — TRAVASOL 10 % IV SOLN
INTRAVENOUS | Status: AC
  Filled 2020-08-15 (×2): qty 1188

## 2020-08-15 NOTE — Consult Note (Addendum)
Wyline Mood , MD [address], [address], [place], [place], [address] [address], [address], [place], [place], [address] Phone: [phone]  Fax: [phone]  Consultation  Referring Provider:    Dr [name] Primary Care Physician:  Pcp, No Primary Gastroenterologist:  Dr. Tobi Bastos   Reason for Consultation:     Ileus   Date of Admission:  07/28/2020 Date of Consultation:  08/15/2020         HPI:   Thomas Rivas is a 24 y.o. 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  His postoperative course was complicated.  Review of epic and surgery note from March 2021 at [facility] clinic provides the history. At that time in March 2021 he underwent total abdominal colectomy.  Subsequently had ileus and massive gastric distention concern for pneumoperitoneum underwent reexploratory laparotomy, EGD where he is found to have extensively dilated but viable bowel, no evidence of leak, normal EGD, commenced on TPN.  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  Developed high output ileostomy subsequently.  Was on TPN.  Had a G-tube placed.  Subsequent follow-up with abdomen surgery.  The pathology report demonstrated dilated colonic lumen up to 20 cm, extensive ischemic colitis distal rectum with dense submucosal fibrosis ganglion cells are present within all sections of examined bowel from proximal small bowel resection margin to the distal resection margin.  Benign and viable proximal small bowel distal colonic resection margins with identifiable ganglion cells.   When I saw him at my office on 07/03/2020 he was doing very well.  No abdominal pain or no issues with his ileostomy bag.  He recollects he has had constipation his entire  life.  Had gone 4 weeks without having a bowel movement  He was admitted to the hospital on 07/28/2020 with abdominal pain.  Her diagnosed with small bowel obstruction commenced on NG tube and IV fluids.   07/28/2020 CT abdomen and pelvis with contrast demonstrated status post colectomy with ostomy and interval development of gastric distention and severe small bowel dilation most consistent with distal small bowel obstruction.  Transition zone appears to be in the right upper quadrant.   08/12/2020: CT abdomen pelvis with contrast demonstrated interval development of fluid collection involving the retroperitoneal space which is 14.8 x 8.2 cm.  Concerning for abscess at the contains gas.  Moderate-sized hiatal hernia.  Small right pleural effusion.  08/12/2020 underwent CT-guided drainage of abscess.   08/13/2020 x-ray shows nasogastric tube in the stomach persistent small bowel dilation consistent with ileus obstruction not excluded  He is 11 days postop status post exploratory laparotomy and lysis of adhesions for small bowel obstruction secondary to internal hernia complicated by fascial dehiscence and 6 days postop.  On TPN.   He says he feels better today , still has a good amount of output from NG, liquid output in ostomy bag, denies any pain or distension of abdomen.  Past Medical History:  Diagnosis Date  . Hirschsprung's disease     Past Surgical History:  Procedure Laterality Date  . ABDOMINAL WOUND DEHISCENCE N/A 08/09/2020   Procedure: ABDOMINAL WOUND DEHISCENCE REPAIR;  Surgeon: Leafy Ro, MD;  Location: [facility] ORS;  Service: General;  Laterality: N/A;  . COLON SURGERY  12/21/2019   abdominal colectomy  w/ileostomy  . EXPLORATORY LAPAROTOMY  12/25/2019  . GASTROJEJUNOSTOMY  12/31/2019  . LAPAROTOMY N/A 08/02/2020   Procedure: EXPLORATORY LAPAROTOMY;  Surgeon: Leafy Ro, MD;  Location: [facility] ORS;  Service: General;  Laterality: N/A;    Prior to Admission medications    Medication Sig Start Date End Date Taking? Authorizing Provider  Ensure (ENSURE) Take 237 mLs by mouth.    [provider]    History reviewed. No pertinent family history.   Social History   Tobacco Use  . Smoking status: Current Some Day Smoker    Types: Cigarettes  . Smokeless tobacco: Never Used  . Tobacco comment: 1-2 cigarettes 2-3 days per week.  Vaping Use  . Vaping Use: Never used  Substance Use Topics  . Alcohol use: Not Currently  . Drug use: Yes    Types: Marijuana    Allergies as of 07/28/2020  . (No Known Allergies)    Review of Systems:    All systems reviewed and negative except where noted in HPI.   Physical Exam:  Vital signs in last 24 hours: Temp:  [98.6 F (37 C)-99 F (37.2 C)] 98.6 F (37 C) (11/11 1149) Pulse Rate:  [118-127] 120 (11/11 1149) Resp:  [16-18] 16 (11/11 1149) BP: (120-129)/(77-85) 120/77 (11/11 1149) SpO2:  [98 %-100 %] 98 % (11/11 1149) Last BM Date: 08/15/20 General:   Pleasant, cooperative in NAD Head:  Normocephalic and atraumatic. Eyes:   No icterus.   Conjunctiva pink. PERRLA. Ears:  Normal auditory acuity. Lungs: Respirations even and unlabored. Lungs clear to auscultation bilaterally.   No wheezes, crackles, or rhonchi.  Heart:  Regular rate and rhythm;  Without murmur, clicks, rubs or gallops Abdomen:  Multip;le bandages, ostomy bag RLQ, no tenderness BS+Neurologic:  Alert and oriented x3;  grossly normal neurologically. Psych:  Alert and cooperative. Normal affect.  LAB RESULTS: Recent Labs    08/14/20 0434  WBC 9.7  HGB 8.0*  HCT 25.0*  PLT 1,129*   BMET Recent Labs    08/13/20 0529 08/14/20 0434 08/15/20 0627  NA 137 133* 136  K 4.2 4.4 4.2  CL 103 100 100  CO2 26 25 28   GLUCOSE 101* 108* 88  BUN 16 17 22*  CREATININE 0.79 0.66 0.80  CALCIUM 8.2* 8.2* 8.7*   LFT Recent Labs    08/15/20 0627  PROT 8.0  ALBUMIN 2.3*  AST 23  ALT 30  ALKPHOS 153*  BILITOT 0.9   PT/INR No  results for input(s): LABPROT, INR in the last 72 hours.  STUDIES: DG Abd Portable 2V  Result Date: 08/15/2020 CLINICAL DATA:  Ileus.  Nasogastric tube placement. EXAM: PORTABLE ABDOMEN - 2 VIEW COMPARISON:  08/12/2020 FINDINGS: Nasogastric tube enters the stomach. Side hole is at the gastroesophageal junction. Right lower quadrant ostomy as seen previously. Gaseous distension of the small bowel persists. Drainage catheter in the central pelvis remains in place. IMPRESSION: Nasogastric tube enters the stomach. Side hole at the gastroesophageal junction. Persistent small bowel dilatation consistent with persistent ileus. Obstruction not excluded radiographically. Electronically Signed   By: 13/05/2020 M.D.   On: 08/15/2020 08:12      Impression / Plan:   Thomas Rivas is a 24 y.o. y/o male with  A history of total abdominal colectomy for presumed hirschsprung disease with subsequent complications and multiple abdominal surgeries. Admitted with a bowel obstruction- s/p ex lap and lysis of adhesions, I have been called for persistent concern for Ileus and small bowel  distension. Watery output in the ostomy bag.    Plan  1. With very watery output in the bag- consider checking for C diff as it too can cause an ileus of the small bowel . IF negative suggest MR enterogram preferable over CT enterogram as he has had multiple CT scans , to rule out any small bowel ostruction, if no obstruction seen in the small bowel and ileus still persists despite normalization of electrolytes/ avoiding opiates , next step would be to perform a gastrograffin follow through. The high osmotic potential of the material will decrease bowel edema and could restart peristalsis .  2. In the interim agree with continuing Reglan , IV fluids, TPN  Thank you for involving me in the care of this patient.      LOS: 18 days   Wyline Mood, MD  08/15/2020, 2:21 PM

## 2020-08-15 NOTE — Treatment Plan (Signed)
Pts mother called at [phone] at this time.

## 2020-08-15 NOTE — Consult Note (Signed)
PHARMACY - TOTAL PARENTERAL NUTRITION CONSULT NOTE   Indication: Small bowel obstruction  Patient Measurements: Height: 5\' 10"  (177.8 cm) Weight: 64 kg (141 lb 1.5 oz) IBW/kg (Calculated) : 73 TPN AdjBW (KG): 65.5 Body mass index is 20.24 kg/m. Usual Weight: 64 kg (current 58kg)  Assessment: Thomas Rivas is a 24 y.o. 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  Triglycerides and transaminases are back wnl Glucose / Insulin: 109 - 137 mg/dL (4 units SSI required) - A1C 6.1% Electrolytes: electroltes WNL Renal: SCr  < 1, stable LFTs / TGs:          AST/ALT WNL, Bilirubin now wnl         TG 106--> 451-->89 Prealbumin / albumin: albumin 3.0 > 2.1 > 2.4 Intake / Output; MIVF: MIVF: NS at 50 ml/hr  11/09 0701 - 11/10 0700 In: 5 Out: 3420 [Urine:1350; Emesis/NG output:1450; Drains:95; Stool:525]  GI Imaging:  KUB (08/15/2020) still with marked bowel distension Surgeries / Procedures:  11 Days Post-Op s/p exploratory laparotomy with extensive lysis of adhesions 6 Days Post-Op s/p closure of fascial dehiscence with internal and external retention sutrures Central access: PICC line placed 08/01/20 TPN start date: 08/01/20  Nutritional Goals (per RD recommendation on 10/27): kCal: 2100-2400kcal/day, Protein: 110-120g/day, Fluid: 2.0-2.3L/day Goal TPN rate is 90 mL/hr (provides ~110 g of protein and ~ 2100 kcals per day)  Current Nutrition:  CLD  Plan:   Continue current TPN at 90 mL/hr: (total volume 2260 mL)  Protein: 118.8 g  Dextrose 302 g  Lipids: 64.8g  kCal 2151  Electrolytes in TPN: 100 mEq/L of Na, 40 mEq/L of K, 5 mEq/L of Ca, 5 mEq/L of Mg, and 5 mmol/L of Phos. Cl:Ac ratio 1:1   Add standard MVI and trace elements to TPN  continue SSI at sensitive scale (0-9 units)  q4h  Continue regular insulin 20 units to TPN bag  MIVF: continue NS at 50 ml/hr   Monitor TPN labs on Mon/Thurs: CMP, Mg, Phosphorus,TG    2152, PharmD, BCPS Clinical Pharmacist 08/15/2020 7:05 AM

## 2020-08-15 NOTE — Progress Notes (Signed)
   08/15/20 0801  Assess: MEWS Score  Temp 98.6 F (37 C)  BP 123/78  Pulse Rate (!) 120  Resp 18  SpO2 98 %  O2 Device Room Air  Assess: MEWS Score  MEWS Temp 0  MEWS Systolic 0  MEWS Pulse 2  MEWS RR 0  MEWS LOC 0  MEWS Score 2  MEWS Score Color Yellow  Assess: if the MEWS score is Yellow or Red  Were vital signs taken at a resting state? Yes  Focused Assessment No change from prior assessment  Early Detection of Sepsis Score *See Row Information* Low  MEWS guidelines implemented *See Row Information* No, previously yellow, continue vital signs every 4 hours  Treat  Pain Scale 0-10  Pain Score 0  Escalate  MEWS: Escalate Yellow: discuss with charge nurse/RN and consider discussing with provider and RRT  Notify: Charge Nurse/RN  Name of Charge Nurse/RN Notified Anabella, RN  Date Charge Nurse/RN Notified 08/15/20  Time Charge Nurse/RN Notified 0803  Upon coming to shift, pt was previous yellow. Pt denies distress and no distress noticed to the visible eye. Tachycardia discussed with Zack, PA and PA states he will speak to attending. Attending later found this writer and states he is aware of his tachycardia and will continue to follow with no interventions at this time since he has been this way since arrival. Pt assessment completed at this time. Will continue to monitor closely.

## 2020-08-15 NOTE — Progress Notes (Signed)
[facility] SURGICAL PROGRESS NOTE  Hospital Day(s): 18.   Post op day(s): 6 Days Post-Op.   Interval History:  Patient seen and examined no acute events or new complaints overnight.  Main issue is soreness, pain regimen helping No fever, chills, nausea, emesis Remains tachycardic ~120 bpms Labs are still in process this morning  UO - 900 ccs NGT output is recorded at 0.9L in last 24 hours Percutaneous drain with 85 ccs out: serous Cx from this on 11/08 growing gram (+) cocci in pairs, gram (-) rods He remains on Zosyn  Ileostomy function slowing, recorded at 300 ccs Has not been mobilizing much secondary to wound dehiscence   Vital signs in last 24 hours: [min-max] current  Temp:  [98.6 F (37 C)-99 F (37.2 C)] 98.7 F (37.1 C) (11/11 0330) Pulse Rate:  [118-137] 125 (11/11 0330) Resp:  [16-18] 17 (11/11 0330) BP: (116-129)/(73-85) 122/85 (11/11 0330) SpO2:  [98 %-100 %] 100 % (11/11 0330)     Height: 5\' 10"  (177.8 cm) Weight: 58 kg BMI (Calculated): 18.35   Intake/Output last 2 shifts:  11/10 0701 - 11/11 0700 In: 5  Out: 2235 [Urine:900; Emesis/NG output:950; Drains:85; Stool:300]   Physical Exam:  Constitutional: alert, cooperative and no distress HEENT:NGT in place Respiratory: breathing non-labored at rest  Cardiovascular: Tachycardia and sinus rhythm  Gastrointestinal:Soft, expected incisional soreness, no appreciable distension, no rebound/guarding, ileostomy in the RLQ with gas and stool in bag. JP drain in LLQ with serous appearing output Integumentary:Midline laparotomy healing via secondary intention, the inferior half of this incision hs retention sutures present both internal and external, the superior portion is granulation tissue healing via secondary intention   Labs:  CBC Latest Ref Rng & Units 08/14/2020 08/12/2020 08/10/2020  WBC 4.0 - 10.5 K/uL 9.7 21.4(H) 20.0(H)  Hemoglobin 13.0 - 17.0 g/dL 8.0(L) 8.4(L) 9.5(L)  Hematocrit  39 - 52 % 25.0(L) 26.1(L) 30.7(L)  Platelets 150 - 400 K/uL 1,129(HH) 906(HH) 646(H)   CMP Latest Ref Rng & Units 08/14/2020 08/13/2020 08/12/2020  Glucose 70 - 99 mg/dL 13/05/2020) 956(O) 130(Q)  BUN 6 - 20 mg/dL 17 16 19   Creatinine 0.61 - 1.24 mg/dL 657(Q 4.69  Sodium 135 - 145 mmol/L 133(L) 137 138  Potassium 3.5 - 5.1 mmol/L 4.4 4.2 4.0  Chloride 98 - 111 mmol/L 100 103 106  CO2 22 - 32 mmol/L 25 26 25   Calcium 8.9 - 10.3 mg/dL 8.2(L) 8.2(L) 8.1(L)  Total Protein 6.5 - 8.1 g/dL - 6.7 6.3(L)  Total Bilirubin 0.3 - 1.2 mg/dL - 0.5 0.5  Alkaline Phos 38 - 126 U/L - 125 115  AST 15 - 41 U/L - 19 17  ALT 0 - 44 U/L - 27 31     Imaging studies:   KUB (08/15/2020) personally reviewed still with marked bowel distension, and radiologist report reviewed:  IMPRESSION: Nasogastric tube enters the stomach. Side hole at the gastroesophageal junction. Persistent small bowel dilatation consistent with persistent ileus. Obstruction not excluded radiographically.    Assessment/Plan:  24 y.o. male 6 Days Post-Op s/p closure of fascial dehiscence with internal and external retention sutrures 11 Days Post-Ops/p exploratory laparotomy and lysis of adhesions for small bowel obstruction secondary to internal hernia, complicated by pertinent comorbidities includings/p subtotal colectomy for reportedly Hirschsprung's disease however he does nothave a formal diagnosis of this and was pending outpatient work up with GI.   - Continue NPO; okay for sips of clears for comfort               -  Continue to use NGT to LIS; monitor and record output - Continue maintenance fluids; NS 50 ml/hr; he has lots of GI losses -Continue TPN today at goal rate and current formulation; lipids are under goal but he has been able to maintain triglycerides at normal levels - Continue local wound care: wet to dry dressing with vaseline daily+ prn, rentention sutures present; I did  change this today  - Monitor abdominal examination; on-going bowel function  - Continue drain; record output  - Continue ABx for now (Zosyn); follow up Cx from placement on 11/08 - Pain control prn; antiemetics prn - Monitor leukocytosis; resolved - Chair and bathroom privileges  - DVT prophylaxis   All of the above findings and recommendations were discussed with the patient, and the medical team, and all of patient's questions were answered to her expressed satisfaction.  -- Lynden Oxford, PA-C [facility] 08/15/2020, 7:28 AM [phone] M-F: 7am - 4pm

## 2020-08-16 ENCOUNTER — Inpatient Hospital Stay: Payer: Medicaid Other

## 2020-08-16 LAB — GLUCOSE, CAPILLARY
Glucose-Capillary: 100 mg/dL — ABNORMAL HIGH (ref 70–99)
Glucose-Capillary: 112 mg/dL — ABNORMAL HIGH (ref 70–99)
Glucose-Capillary: 116 mg/dL — ABNORMAL HIGH (ref 70–99)
Glucose-Capillary: 119 mg/dL — ABNORMAL HIGH (ref 70–99)
Glucose-Capillary: 135 mg/dL — ABNORMAL HIGH (ref 70–99)
Glucose-Capillary: 135 mg/dL — ABNORMAL HIGH (ref 70–99)
Glucose-Capillary: 93 mg/dL (ref 70–99)

## 2020-08-16 MED ORDER — INSULIN ASPART 100 UNIT/ML ~~LOC~~ SOLN
0.0000 [IU] | Freq: Four times a day (QID) | SUBCUTANEOUS | Status: DC
  Administered 2020-08-18: 1 [IU] via SUBCUTANEOUS
  Filled 2020-08-16: qty 1

## 2020-08-16 MED ORDER — TRAVASOL 10 % IV SOLN
INTRAVENOUS | Status: AC
  Filled 2020-08-16: qty 1188

## 2020-08-16 NOTE — Progress Notes (Addendum)
[facility] SURGICAL PROGRESS NOTE  Hospital Day(s): 19.   Post op day(s): 7 Days Post-Op.   Interval History:  Patient seen and examined no acute events or new complaints overnight.  Main issue is soreness, pain regimen helping No fever, chills, nausea, emesis Remains tachycardic ~120 bpm No new labs this morning  UO - 700 ccs NGT output is recorded at 1.5L in last 24 hours Percutaneous drain with 20 ccs out: serous Cx from this on 11/08 growing multiple organisms; non predominant; report pending  He remains on Zosyn  Ileostomy function slowing, recorded at 375 ccs Has not been mobilizing much secondary to wound dehiscence   Vital signs in last 24 hours: [min-max] current  Temp:  [98.1 F (36.7 C)-99.5 F (37.5 C)] 98.1 F (36.7 C) (11/12 0522) Pulse Rate:  [117-131] 122 (11/12 0522) Resp:  [16-19] 19 (11/12 0522) BP: (120-129)/(77-84) 129/84 (11/12 0522) SpO2:  [98 %-100 %] 100 % (11/12 0522) Weight:  [58.1 kg-58.5 kg] 58.5 kg (11/12 0500)     Height: 5\' 10"  (177.8 cm) Weight: 58.5 kg BMI (Calculated): 18.51   Intake/Output last 2 shifts:  11/11 0701 - 11/12 0700 In: 3302.6 [I.V.:3302.6] Out: 2595 [Urine:700; Emesis/NG output:1500; Drains:20; Stool:375]   Physical Exam:  Constitutional: alert, cooperative and no distress HEENT:NGT in place Respiratory: breathing non-labored at rest  Cardiovascular: Tachycardia and sinus rhythm  Gastrointestinal:Soft, expected incisional soreness, no appreciable distension, no rebound/guarding, ileostomy in the RLQ with gas and stool in bag. JP drain in LLQ with serous appearing output Integumentary:Midline laparotomy healing via secondary intention, the inferior half of this incision hs retention sutures present both internal and external, the superior portion is granulation tissue healing via secondary intention   Labs:  CBC Latest Ref Rng & Units 08/14/2020 08/12/2020 08/10/2020  WBC 4.0 - 10.5 K/uL 9.7 21.4(H)  20.0(H)  Hemoglobin 13.0 - 17.0 g/dL 8.0(L) 8.4(L) 9.5(L)  Hematocrit 39 - 52 % 25.0(L) 26.1(L) 30.7(L)  Platelets 150 - 400 K/uL 1,129(HH) 906(HH) 646(H)   CMP Latest Ref Rng & Units 08/15/2020 08/14/2020 08/13/2020  Glucose 70 - 99 mg/dL 88 13/06/2020) 119(E)  BUN 6 - 20 mg/dL 174(Y) 17 16  Creatinine 0.61 - 1.24 mg/dL 81(K 4.81 8.56  Sodium 135 - 145 mmol/L 136 133(L) 137  Potassium 3.5 - 5.1 mmol/L 4.2 4.4 4.2  Chloride 98 - 111 mmol/L 100 100 103  CO2 22 - 32 mmol/L 28 25 26   Calcium 8.9 - 10.3 mg/dL 3.14) ) 9.7(W)  Total Protein 6.5 - 8.1 g/dL 8.0 - 6.7  Total Bilirubin 0.3 - 1.2 mg/dL 0.9 - 0.5  Alkaline Phos 38 - 126 U/L 153(H) - 125  AST 15 - 41 U/L 23 - 19  ALT 0 - 44 U/L 30 - 27     Imaging studies:   KUB (08/16/2020) personally reviewed still with marked bowel distension and relatively no change from prior, and radiologist report reviewed:  IMPRESSION: Persistent small bowel dilatation likely related ileus. Drainage catheter is noted inferiorly. No free air is noted.  Gastric catheter should be advanced further into the stomach.   Assessment/Plan:  24 y.o. male 7 Days Post-Op s/p closure of fascial dehiscence with internal and external retention sutrures 12 Days Post-Ops/p exploratory laparotomy and lysis of adhesions for small bowel obstruction secondary to internal hernia, complicated by pertinent comorbidities includings/p subtotal colectomy for reportedly Hirschsprung's disease however he does nothave a formal diagnosis of this and was pending outpatient work up with GI.   -  Continue NPO; okay for sips of clears for comfort               - Continue to use NGT to LIS; monitor and record output - Continue maintenance fluids; NS 50 ml/hr; he has lots of GI losses -Continue TPN today at goal rate and current formulation; lipids are under goal but he has been able to maintain triglycerides at normal levels - Continue local  wound care: wet to dry dressing with vaseline daily+ prn, rentention sutures present; I did change this today  - Monitor abdominal examination; on-going bowel function  - Continue drain; record output  - Continue ABx for now (Zosyn); follow up Cx from placement on 11/08 - Pain control prn; antiemetics prn - Monitor leukocytosis; resolved - Chair and bathroom privileges  - DVT prophylaxis   All of the above findings and recommendations were discussed with the patient, and the medical team, and all of patient's questions were answered to her expressed satisfaction.  -- Lynden Oxford, PA-C [facility] 08/16/2020, 7:53 AM [phone] M-F: 7am - 4pm

## 2020-08-16 NOTE — Progress Notes (Signed)
   08/16/20 0522  Assess: MEWS Score  Temp 98.1 F (36.7 C)  BP 129/84  Pulse Rate (!) 122  Resp 19  SpO2 100 %  O2 Device Room Air  Assess: MEWS Score  MEWS Temp 0  MEWS Systolic 0  MEWS Pulse 2  MEWS RR 0  MEWS LOC 0  MEWS Score 2  MEWS Score Color Yellow  Assess: if the MEWS score is Yellow or Red  Were vital signs taken at a resting state? Yes  Focused Assessment No change from prior assessment  Early Detection of Sepsis Score *See Row Information* Medium  MEWS guidelines implemented *See Row Information* No, previously yellow, continue vital signs every 4 hours  Treat  MEWS Interventions Administered prn meds/treatments  Pain Scale 0-10  Pain Score 6  Escalate  MEWS: Escalate Yellow: discuss with charge nurse/RN and consider discussing with provider and RRT  Notify: Charge Nurse/RN  Name of Charge Nurse/RN Notified Marcella   Date Charge Nurse/RN Notified 08/16/20  Time Charge Nurse/RN Notified 0530  Document  Patient Outcome Stabilized after interventions  Patient has been a yellow MEWS all night due to his heart rate. Providers aware and no changes in his care at this time.

## 2020-08-16 NOTE — Consult Note (Signed)
PHARMACY - TOTAL PARENTERAL NUTRITION CONSULT NOTE   Indication: Small bowel obstruction  Patient Measurements: Height: 5\' 10"  (177.8 cm) Weight: 64 kg (141 lb 1.5 oz) IBW/kg (Calculated) : 73 TPN AdjBW (KG): 65.5 Body mass index is 20.24 kg/m. Usual Weight: 64 kg (current 58kg)  Assessment: Thomas Rivas is a 24 y.o. 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  Triglycerides and transaminases are back wnl Glucose / Insulin: 93 - 135 mg/dL (3 units SSI required) - A1C 6.1% Electrolytes: electroltes WNL, stable Renal: SCr  < 1, stable LFTs / TGs:          AST/ALT WNL, Bilirubin now wnl         TG 106--> 451-->89 Prealbumin / albumin: albumin 3.0 > 2.1 > 2.4 Intake / Output; MIVF: MIVF: NS at 50 ml/hr  11/11 0701 - 11/12 0700 In: 3302.6 [I.V.:3302.6] Out: 2595 [Urine:700; Emesis/NG output:1500; Drains:20; Stool:375]   Recent GI Imaging:  KUB (08/16/2020) still with marked bowel distension and relatively no change Surgeries / Procedures:  12 Days Post-Op s/p exploratory laparotomy with extensive lysis of adhesions 7 Days Post-Op s/p closure of fascial dehiscence with internal and external retention sutrures Central access: PICC line placed 08/01/20 TPN start date: 08/01/20  Nutritional Goals (per RD recommendation on 10/27): kCal: 2100-2400kcal/day, Protein: 110-120g/day, Fluid: 2.0-2.3L/day Goal TPN rate is 90 mL/hr (provides ~110 g of protein and ~ 2100 kcals per day)  Current Nutrition:  CLD  Plan:   Continue current TPN at 90 mL/hr: (total volume 2260 mL)  Protein: 118.8 g  Dextrose 302 g  Lipids: 64.8g  kCal 2151  Electrolytes in TPN: 100 mEq/L of Na, 40 mEq/L of K, 5 mEq/L of Ca, 5 mEq/L of Mg, and 5 mmol/L of Phos. Cl:Ac ratio 1:1   Add standard MVI and trace elements to  TPN  continue SSI at sensitive scale (0-9 units) q6h  Continue regular insulin 20 units to TPN bag  MIVF: continue NS at 50 ml/hr   Monitor TPN labs on Mon/Thurs: CMP, Mg, Phosphorus,TG   BMP, magnesium in am   2152, PharmD, BCPS Clinical Pharmacist 08/16/2020 7:28 AM

## 2020-08-16 NOTE — TOC Initial Note (Signed)
Transition of Care [facility]) - Initial/Assessment Note    Patient Details  Name: Thomas Rivas MRN: [id number] Date of Birth: 1995-11-06  Transition of Care [facility]) CM/SW Contact:    Candie Chroman, LCSW Phone Number: 08/16/2020, 10:03 AM  Clinical Narrative:  Readmission prevention screen complete. CSW met with patient. No supports at bedside. CSW introduced role and explained that discharge planning would be discussed. Patient does not have a PCP. Will provide a booklet for free/low-cost healthcare in [place] at discharge. If he needs home health at discharge, will need to set him up with a provider prior to discharge and ask physician advisor to cover orders until he can get established with a provider. Patient reports he will have transportation to appointments. Patient has no pharmacy preference. He will have no issues affording medications. Patient has Medicaid so copays are around $3. Patient did not have home health or DME prior to admission. He stated he has not been using DME to get around here at the hospital. Patient lives home with his girlfriend. Per surgical PA on 11/2, patient should not have to discharge on TPN. CSW made Carolynn Sayers with [facility] aware of case in the event he does have to discharge with it. No further concerns. CSW encouraged patient to contact CSW as needed. CSW will continue to follow patient for support and facilitate return home when stable.              Expected Discharge Plan: Home/Self Care Barriers to Discharge: Continued Medical Work up   Patient Goals and CMS Choice        Expected Discharge Plan and Services Expected Discharge Plan: Home/Self Care       Living arrangements for the past 2 months: Single Family Home                                      Prior Living Arrangements/Services Living arrangements for the past 2 months: Single Family Home Lives with:: Significant Other Patient language and need for  interpreter reviewed:: Yes Do you feel safe going back to the place where you live?: Yes      Need for Family Participation in Patient Care: Yes (Comment) Care giver support system in place?: Yes (comment)   Criminal Activity/Legal Involvement Pertinent to Current Situation/Hospitalization: No - Comment as needed  Activities of Daily Living Home Assistive Devices/Equipment: None ADL Screening (condition at time of admission) Patient's cognitive ability adequate to safely complete daily activities?: Yes Is the patient deaf or have difficulty hearing?: No Does the patient have difficulty seeing, even when wearing glasses/contacts?: No Does the patient have difficulty concentrating, remembering, or making decisions?: No Patient able to express need for assistance with ADLs?: Yes Does the patient have difficulty dressing or bathing?: No Independently performs ADLs?: Yes (appropriate for developmental age) Does the patient have difficulty walking or climbing stairs?: No Weakness of Legs: None Weakness of Arms/Hands: None  Permission Sought/Granted                  Emotional Assessment Appearance:: Appears stated age Attitude/Demeanor/Rapport: Engaged, Gracious Affect (typically observed): Accepting, Appropriate, Calm, Pleasant Orientation: : Oriented to Self, Oriented to Place, Oriented to  Time, Oriented to Situation Alcohol / Substance Use: Not Applicable Psych Involvement: No (comment)  Admission diagnosis:  SBO (small bowel obstruction) ([place]) [K56.609] Patient Active Problem List   Diagnosis Date Noted  .  Protein-calorie malnutrition, severe 08/07/2020  . Tachycardia   . Hypernatremia   . SBO (small bowel obstruction) ([place]) 07/28/2020  . Abdominal pain 07/28/2020  . Nausea & vomiting 07/28/2020  . Leukocytosis 07/28/2020  . Chronic idiopathic constipation   . 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 Tobacco use 04/22/2019  . Constipation by delayed colonic transit  04/22/2019   PCP:  Pcp, No Pharmacy:   [id number] [place] [id number]: [phone]: [phone]    Social Determinants of Health ([place] Interventions    Readmission Risk Interventions Readmission Risk Prevention Plan 08/16/2020  Transportation Screening Complete  PCP or Specialist Appt within 3-5 Days (No Data)  Social Work Consult for [place]  Medication Review (Press photographer Complete

## 2020-08-16 NOTE — Progress Notes (Signed)
PICC assessment: R DL PICC infusing TPN. Red lumen occluded unless arm is abducted, then brisk blood return noted and flushes easily. Pt reports "hearing" whooshing noise near L ear when line is flushed. Recommend chest Xray to confirm tip placement.

## 2020-08-17 ENCOUNTER — Inpatient Hospital Stay: Payer: Medicaid Other

## 2020-08-17 LAB — CBC
HCT: 26.4 % — ABNORMAL LOW (ref 39.0–52.0)
Hemoglobin: 8.3 g/dL — ABNORMAL LOW (ref 13.0–17.0)
MCH: 22.6 pg — ABNORMAL LOW (ref 26.0–34.0)
MCHC: 31.4 g/dL (ref 30.0–36.0)
MCV: 71.7 fL — ABNORMAL LOW (ref 80.0–100.0)
Platelets: 1175 10*3/uL (ref 150–400)
RBC: 3.68 MIL/uL — ABNORMAL LOW (ref 4.22–5.81)
RDW: 18.1 % — ABNORMAL HIGH (ref 11.5–15.5)
WBC: 11.5 10*3/uL — ABNORMAL HIGH (ref 4.0–10.5)
nRBC: 0 % (ref 0.0–0.2)

## 2020-08-17 LAB — COMPREHENSIVE METABOLIC PANEL
ALT: 41 U/L (ref 0–44)
AST: 34 U/L (ref 15–41)
Albumin: 2.3 g/dL — ABNORMAL LOW (ref 3.5–5.0)
Alkaline Phosphatase: 145 U/L — ABNORMAL HIGH (ref 38–126)
Anion gap: 13 (ref 5–15)
BUN: 20 mg/dL (ref 6–20)
CO2: 25 mmol/L (ref 22–32)
Calcium: 8.7 mg/dL — ABNORMAL LOW (ref 8.9–10.3)
Chloride: 99 mmol/L (ref 98–111)
Creatinine, Ser: 0.76 mg/dL (ref 0.61–1.24)
GFR, Estimated: >60 mL/min (ref >60)
Glucose, Bld: 96 mg/dL (ref 70–99)
Potassium: 4.3 mmol/L (ref 3.5–5.1)
Sodium: 137 mmol/L (ref 135–145)
Total Bilirubin: 0.9 mg/dL (ref 0.3–1.2)
Total Protein: 8.1 g/dL (ref 6.5–8.1)

## 2020-08-17 LAB — GLUCOSE, CAPILLARY
Glucose-Capillary: 116 mg/dL — ABNORMAL HIGH (ref 70–99)
Glucose-Capillary: 117 mg/dL — ABNORMAL HIGH (ref 70–99)

## 2020-08-17 LAB — AEROBIC/ANAEROBIC CULTURE W GRAM STAIN (SURGICAL/DEEP WOUND)

## 2020-08-17 LAB — PHOSPHORUS: Phosphorus: 3.9 mg/dL (ref 2.5–4.6)

## 2020-08-17 LAB — MAGNESIUM: Magnesium: 1.7 mg/dL (ref 1.7–2.4)

## 2020-08-17 MED ORDER — MAGNESIUM SULFATE 2 GM/50ML IV SOLN
2.0000 g | Freq: Once | INTRAVENOUS | Status: AC
  Administered 2020-08-17: 2 g via INTRAVENOUS
  Filled 2020-08-17: qty 50

## 2020-08-17 MED ORDER — TRAVASOL 10 % IV SOLN
INTRAVENOUS | Status: AC
  Filled 2020-08-17: qty 1188

## 2020-08-17 NOTE — Consult Note (Signed)
PHARMACY - TOTAL PARENTERAL NUTRITION CONSULT NOTE   Indication: Small bowel obstruction  Patient Measurements: Height: 5\' 10"  (177.8 cm) Weight: 64 kg (141 lb 1.5 oz) IBW/kg (Calculated) : 73 TPN AdjBW (KG): 65.5 Body mass index is 20.24 kg/m. Usual Weight: 64 kg (current 58kg)  Assessment: Mr Selsor is a 24 y.o. 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  Triglycerides and transaminases are back wnl Glucose / Insulin: 93 - 116 mg/dL (0 units SSI required) -   regular insulin 20 units to TPN bag A1C 6.1% Electrolytes: Mag 1.7  Will replace.  Na 137 Renal: SCr  < 1, stable LFTs / TGs:          AST/ALT WNL, Bilirubin now wnl         TG 106--> 451-->89 Prealbumin / albumin: albumin 3.0 > 2.1 > 2.4>2.3 Intake / Output; MIVF: MIVF: NS at 50 ml/hr  11/11 0701 - 11/12 0700 In: 3302.6 [I.V.:3302.6] Out: 2595 [Urine:700; Emesis/NG output:1500; Drains:20; Stool:375]   Recent GI Imaging:  KUB (08/16/2020) still with marked bowel distension and relatively no change Surgeries / Procedures:  12 Days Post-Op s/p exploratory laparotomy with extensive lysis of adhesions 7 Days Post-Op s/p closure of fascial dehiscence with internal and external retention sutrures Central access: PICC line placed 08/01/20 TPN start date: 08/01/20  Nutritional Goals (per RD recommendation on 10/27): kCal: 2100-2400kcal/day, Protein: 110-120g/day, Fluid: 2.0-2.3L/day Goal TPN rate is 90 mL/hr (provides ~110 g of protein and ~ 2100 kcals per day)  Current Nutrition:  CLD  Plan:   Continue current TPN at 90 mL/hr: (total volume 2260 mL)  Protein: 55 g/L=118.8 g/day  Dextrose 302 g (14%)  Lipids: 30 g/L=64.8 g/day  kCal 2151  Electrolytes in TPN: 100 mEq/L of Na, 40 mEq/L of K, 5 mEq/L of Ca, 5 mEq/L of Mg, and 5  mmol/L of Phos. Cl:Ac ratio 1:1   Mag 1.7.  Will order Magnesium 2 gm IV x 1  Add standard MVI and trace elements to TPN  continue SSI at sensitive scale (0-9 units) q6h  Continue regular insulin 20 units to TPN bag  MIVF: continue NS at 50 ml/hr   Monitor TPN labs on Mon/Thurs: CMP, Mg, Phosphorus,TG   BMP, magnesium in am   2152, PharmD, BCPS Clinical Pharmacist 08/17/2020 9:56 AM

## 2020-08-17 NOTE — Progress Notes (Signed)
Patient ambulated 240 ft today. Patient tolerated ambulation. HR begin to elevate and sustain in 150's. Patient returned back to bed. Patient asymptomatic. IV lopressor given. Will continue to monitor.   Madie Reno, RN

## 2020-08-17 NOTE — Progress Notes (Signed)
Subjective:  CC: Thomas Rivas is a 24 y.o. male  Hospital stay day 20, 8 Days Post-Op  s/p closure of fascial dehiscence with internal and external retention sutrures,s/p exploratory laparotomy and lysis of adhesions for small bowel obstruction secondary to internal hernia, complicated by pertinent comorbidities includings/p subtotal colectomy for reportedly Hirschsprung's disease however he does nothave a formal diagnosis of this and was pending outpatient work up with GI.  HPI: No acute issues overnight  ROS:  General: Denies weight loss, weight gain, fatigue, fevers, chills, and night sweats. Heart: Denies chest pain, palpitations, racing heart, irregular heartbeat, leg pain or swelling, and decreased activity tolerance. Respiratory: Denies breathing difficulty, shortness of breath, wheezing, cough, and sputum. GI: Denies change in appetite, heartburn, nausea, vomiting, constipation, diarrhea, and blood in stool. GU: Denies difficulty urinating, pain with urinating, urgency, frequency, blood in urine.   Objective:   Temp:  [97.8 F (36.6 C)-99.2 F (37.3 C)] 98.2 F (36.8 C) (11/13 1222) Pulse Rate:  [122-128] 122 (11/13 1222) Resp:  [14-20] 15 (11/13 0737) BP: (116-124)/(75-83) 124/83 (11/13 1222) SpO2:  [100 %] 100 % (11/13 1222) Weight:  [58.6 kg] 58.6 kg (11/13 0422)     Height: 5\' 10"  (177.8 cm) Weight: 58.6 kg BMI (Calculated): 18.54   Intake/Output this shift:   Intake/Output Summary (Last 24 hours) at 08/17/2020 1455 Last data filed at 08/17/2020 1357 Gross per 24 hour  Intake 2274.73 ml  Output 3625 ml  Net -1350.27 ml    Constitutional :  alert, cooperative, appears stated age and no distress  Respiratory:  clear to auscultation bilaterally  Cardiovascular:  regular rate and rhythm  Gastrointestinal: soft, non-tender; bowel sounds normal; no masses,  no organomegaly.  Ileostomy with liquid stool output NG with thick bilious output, over 1.8 L noted  past 24 hours  Skin: Cool and moist.  Retention sutures intact with midline wound showing healthy granulation tissue, minimal depth.  JP with scant serous fluid  Psychiatric: Normal affect, non-agitated, not confused       LABS:  CMP Latest Ref Rng & Units 08/17/2020 08/15/2020 08/14/2020  Glucose 70 - 99 mg/dL 96 88 13/07/2020)  BUN 6 - 20 mg/dL 20 706(C) 17  Creatinine 0.61 - 1.24 mg/dL 37(S 2.83 1.51  Sodium 135 - 145 mmol/L 137 136 133(L)  Potassium 3.5 - 5.1 mmol/L 4.3 4.2 4.4  Chloride 98 - 111 mmol/L 99 100 100  CO2 22 - 32 mmol/L 25 28 25   Calcium 8.9 - 10.3 mg/dL 7.61) ) 6.0(V)  Total Protein 6.5 - 8.1 g/dL 8.1 8.0 -  Total Bilirubin 0.3 - 1.2 mg/dL 0.9 0.9 -  Alkaline Phos 38 - 126 U/L 145(H) 153(H) -  AST 15 - 41 U/L 34 23 -  ALT 0 - 44 U/L 41 30 -   CBC Latest Ref Rng & Units 08/17/2020 08/14/2020 08/12/2020  WBC 4.0 - 10.5 K/uL 11.5(H) 9.7 21.4(H)  Hemoglobin 13.0 - 17.0 g/dL 8.3(L) 8.0(L) 8.4(L)  Hematocrit 39 - 52 % 26.4(L) 25.0(L) 26.1(L)  Platelets 150 - 400 K/uL 1,175(HH) 1,129(HH) 906(HH)    RADS: CLINICAL DATA:  Small bowel obstruction  EXAM: PORTABLE ABDOMEN - 2 VIEW  COMPARISON:  August 16, 2020  FINDINGS: Supine and upright images obtained. Nasogastric tube tip is in the proximal stomach with the side port above the gastroesophageal junction. There is diffuse bowel dilatation without appreciable air-fluid levels. Right lower quadrant ostomy noted. Drain in pelvis placed from a leftward aspect. Contrast noted in rectum,  unchanged. Mild right base atelectasis. Lung bases otherwise clear.  IMPRESSION: Nasogastric tube side port is above the gastroesophageal junction. Advise advancing nasogastric tube 5-6 cm.  Persistent bowel dilatation noted diffusely without air-fluid levels. No free air appreciable. Right lower quadrant ostomy. Drain in pelvis from a leftward approach, stable.   Electronically Signed   By: Bretta Bang III  M.D.   On: 08/17/2020 10:38  Assessment:    s/p closure of fascial dehiscence with internal and external retention sutrures,s/p exploratory laparotomy and lysis of adhesions for small bowel obstruction secondary to internal hernia, complicated by pertinent comorbidities includings/p subtotal colectomy for reportedly Hirschsprung's disease however he does nothave a formal diagnosis of this and was pending outpatient work up with GI.  Continue current management.  Nurse did report patient with extensive drinking of Gatorade despite n.p.o. status.  This I believe still does not explain the high thick bilious output from his NG, well as the persistent dilation of his small bowel noted on the most recent x-ray.  We will continue to monitor over the weekend.  Continued Zosyn and TPN.  Pending CT scan tomorrow for reevaluation of any persistent intra-abdominal pathology.  White count was noted to be slightly increased today so we will need to continue to monitor this as well. Wound otherwise looking well

## 2020-08-18 ENCOUNTER — Encounter: Payer: Self-pay | Admitting: Internal Medicine

## 2020-08-18 ENCOUNTER — Inpatient Hospital Stay: Payer: Medicaid Other

## 2020-08-18 LAB — GLUCOSE, CAPILLARY
Glucose-Capillary: 103 mg/dL — ABNORMAL HIGH (ref 70–99)
Glucose-Capillary: 103 mg/dL — ABNORMAL HIGH (ref 70–99)
Glucose-Capillary: 114 mg/dL — ABNORMAL HIGH (ref 70–99)
Glucose-Capillary: 132 mg/dL — ABNORMAL HIGH (ref 70–99)
Glucose-Capillary: 93 mg/dL (ref 70–99)

## 2020-08-18 LAB — MAGNESIUM: Magnesium: 1.8 mg/dL (ref 1.7–2.4)

## 2020-08-18 MED ORDER — HYDROMORPHONE HCL 1 MG/ML IJ SOLN
0.5000 mg | INTRAMUSCULAR | Status: DC | PRN
  Administered 2020-08-18 – 2020-08-25 (×29): 0.5 mg via INTRAVENOUS
  Filled 2020-08-18 (×2): qty 1
  Filled 2020-08-18 (×2): qty 0.5
  Filled 2020-08-18 (×5): qty 1
  Filled 2020-08-18 (×3): qty 0.5
  Filled 2020-08-18: qty 1
  Filled 2020-08-18: qty 0.5
  Filled 2020-08-18: qty 1
  Filled 2020-08-18: qty 0.5
  Filled 2020-08-18: qty 1
  Filled 2020-08-18 (×5): qty 0.5
  Filled 2020-08-18: qty 1
  Filled 2020-08-18: qty 0.5
  Filled 2020-08-18: qty 1
  Filled 2020-08-18: qty 0.5
  Filled 2020-08-18 (×3): qty 1

## 2020-08-18 MED ORDER — HYDROMORPHONE HCL 1 MG/ML IJ SOLN: 0.5000 mg | INTRAMUSCULAR | Status: DC | PRN

## 2020-08-18 MED ORDER — TRAVASOL 10 % IV SOLN
INTRAVENOUS | Status: AC
  Filled 2020-08-18: qty 1188

## 2020-08-18 MED ORDER — IOHEXOL 9 MG/ML PO SOLN
500.0000 mL | ORAL | Status: AC
  Administered 2020-08-18: 500 mL via ORAL

## 2020-08-18 MED ORDER — IOHEXOL 300 MG/ML  SOLN
100.0000 mL | Freq: Once | INTRAMUSCULAR | Status: AC | PRN
  Administered 2020-08-18: 100 mL via INTRAVENOUS

## 2020-08-18 MED ORDER — MAGNESIUM SULFATE 2 GM/50ML IV SOLN
2.0000 g | Freq: Once | INTRAVENOUS | Status: AC
  Administered 2020-08-18: 2 g via INTRAVENOUS
  Filled 2020-08-18: qty 50

## 2020-08-18 NOTE — Consult Note (Addendum)
PHARMACY - TOTAL PARENTERAL NUTRITION CONSULT NOTE   Indication: Small bowel obstruction  Patient Measurements: Height: 5\' 10"  (177.8 cm) Weight: 64 kg (141 lb 1.5 oz) IBW/kg (Calculated) : 73 TPN AdjBW (KG): 65.5 Body mass index is 20.24 kg/m. Usual Weight: 64 kg (current 58kg)  Assessment: Thomas Rivas is a 24 y.o. 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  Triglycerides and transaminases are back wnl Glucose / Insulin: 93 - 1119 mg/dL (0 units SSI required) -   regular insulin 20 units to TPN bag A1C 6.1% Electrolytes: Mag 1.8  Will replace.  Na 137 Renal: SCr  < 1, stable LFTs / TGs:          AST/ALT WNL, Bilirubin now wnl         TG 106--> 451-->89 Prealbumin / albumin: albumin 3.0 > 2.1 > 2.4>2.3 Intake / Output; MIVF: MIVF: NS at 50 ml/hr  11/11 0701 - 11/12 0700 In: 3302.6 [I.V.:3302.6] Out: 2595 [Urine:700; Emesis/NG output:1500; Drains:20; Stool:375]   Recent GI Imaging:  KUB (08/16/2020) still with marked bowel distension and relatively no change Surgeries / Procedures:  12 Days Post-Op s/p exploratory laparotomy with extensive lysis of adhesions 7 Days Post-Op s/p closure of fascial dehiscence with internal and external retention sutrures Central access: PICC line placed 08/01/20 TPN start date: 08/01/20  Nutritional Goals (per RD recommendation on 10/27): kCal: 2100-2400kcal/day, Protein: 110-120g/day, Fluid: 2.0-2.3L/day Goal TPN rate is 90 mL/hr (provides ~110 g of protein and ~ 2100 kcals per day)  Current Nutrition:  NPO Per surgery note 11/13: high thick bilious output from his NG, well as the persistent dilation of his small bowel noted on the most recent x-ray  Plan:   Continue current TPN at 90 mL/hr: (total volume 2260 mL)  Protein: 55 g/L=118.8 g/day  Dextrose  302 g (14%)  Lipids: 30 g/L=64.8 g/day  kCal 2151  Electrolytes in TPN: 100 mEq/L of Na, 40 mEq/L of K, 5 mEq/L of Ca, 5 mEq/L of Mg, and 5 mmol/L of Phos. Cl:Ac ratio 1:1   Mag 1.8.  Will order Magnesium 2 gm IV x 1 again today 11/14 (may need to increase Mag in TPN)  Add standard MVI and trace elements to TPN  continue SSI at sensitive scale (0-9 units) q6h. *Continue regular insulin 20 units to TPN bag  MIVF:  NS at 50 ml/hr   Monitor TPN labs on Mon/Thurs: CMP, Mg, Phosphorus,TG   BMP, magnesium in am   12/14, PharmD, BCPS Clinical Pharmacist 08/18/2020 11:14 AM

## 2020-08-18 NOTE — Progress Notes (Signed)
Subjective:  CC: Thomas Rivas is a 24 y.o. male  Hospital stay day 21, 9 Days Post-Op  s/p closure of fascial dehiscence with internal and external retention sutrures,s/p exploratory laparotomy and lysis of adhesions for small bowel obstruction secondary to internal hernia, complicated by pertinent comorbidities includings/p subtotal colectomy for reportedly Hirschsprung's disease however he does nothave a formal diagnosis of this and was pending outpatient work up with GI.  HPI: No acute issues overnight. NG clamped since this am.  ROS:  General: Denies weight loss, weight gain, fatigue, fevers, chills, and night sweats. Heart: Denies chest pain, palpitations, racing heart, irregular heartbeat, leg pain or swelling, and decreased activity tolerance. Respiratory: Denies breathing difficulty, shortness of breath, wheezing, cough, and sputum. GI: Denies change in appetite, heartburn, nausea, vomiting, constipation, diarrhea, and blood in stool. GU: Denies difficulty urinating, pain with urinating, urgency, frequency, blood in urine.   Objective:   Temp:  [98 F (36.7 C)-99.3 F (37.4 C)] 99.3 F (37.4 C) (11/14 0437) Pulse Rate:  [108-125] 125 (11/14 0437) BP: (109-122)/(74-78) 122/74 (11/14 0437) SpO2:  [100 %] 100 % (11/14 0437) Weight:  [58.7 kg] 58.7 kg (11/14 0355)     Height: 5\' 10"  (177.8 cm) Weight: 58.7 kg BMI (Calculated): 18.57   Intake/Output this shift:   Intake/Output Summary (Last 24 hours) at 08/18/2020 1500 Last data filed at 08/18/2020 08/20/2020 Gross per 24 hour  Intake 1137.78 ml  Output 2250 ml  Net -1112.22 ml    Constitutional :  alert, cooperative, appears stated age and no distress  Respiratory:  clear to auscultation bilaterally  Cardiovascular:  regular rate and rhythm  Gastrointestinal: soft, non-tender; bowel sounds normal; no masses,  no organomegaly.  Ileostomy with liquid stool output, NG reattached to suction, only 150 mL of dinner bilious  output  Skin: Cool and moist.  Retention sutures intact with midline wound showing healthy granulation tissue, minimal depth.  JP with scant serous fluid  Psychiatric: Normal affect, non-agitated, not confused       LABS:  CMP Latest Ref Rng & Units 08/17/2020 08/15/2020 08/14/2020  Glucose 70 - 99 mg/dL 96 88 13/07/2020)  BUN 6 - 20 mg/dL 20 193(X) 17  Creatinine 0.61 - 1.24 mg/dL 90(W 4.09 7.35  Sodium 135 - 145 mmol/L 137 136 133(L)  Potassium 3.5 - 5.1 mmol/L 4.3 4.2 4.4  Chloride 98 - 111 mmol/L 99 100 100  CO2 22 - 32 mmol/L 25 28 25   Calcium 8.9 - 10.3 mg/dL 3.29) ) 9.2(E)  Total Protein 6.5 - 8.1 g/dL 8.1 8.0 -  Total Bilirubin 0.3 - 1.2 mg/dL 0.9 0.9 -  Alkaline Phos 38 - 126 U/L 145(H) 153(H) -  AST 15 - 41 U/L 34 23 -  ALT 0 - 44 U/L 41 30 -   CBC Latest Ref Rng & Units 08/17/2020 08/14/2020 08/12/2020  WBC 4.0 - 10.5 K/uL 11.5(H) 9.7 21.4(H)  Hemoglobin 13.0 - 17.0 g/dL 8.3(L) 8.0(L) 8.4(L)  Hematocrit 39 - 52 % 26.4(L) 25.0(L) 26.1(L)  Platelets 150 - 400 K/uL 1,175(HH) 1,129(HH) 906(HH)    RADS: Pending CT Assessment:    s/p closure of fascial dehiscence with internal and external retention sutrures,s/p exploratory laparotomy and lysis of adhesions for small bowel obstruction secondary to internal hernia, complicated by pertinent comorbidities includings/p subtotal colectomy for reportedly Hirschsprung's disease however he does nothave a formal diagnosis of this and was pending outpatient work up with GI.  NG clamp trial today was reassuring with only 150 mL  of residual noted.  However, due to his extensive history we will continue to keep the NG tube clamped and in place for another 24 hours to ensure he does not develop any recurrent symptoms.  Otherwise ostomy has decent output, patient's pain is well controlled.  We will still follow through with the CT scan as previously planned just to ensure no other intra-abdominal pathology.  IV antibiotics and TPN will be  continued otherwise as well.

## 2020-08-19 LAB — CBC
HCT: 26.5 % — ABNORMAL LOW (ref 39.0–52.0)
Hemoglobin: 8.5 g/dL — ABNORMAL LOW (ref 13.0–17.0)
MCH: 22.6 pg — ABNORMAL LOW (ref 26.0–34.0)
MCHC: 32.1 g/dL (ref 30.0–36.0)
MCV: 70.5 fL — ABNORMAL LOW (ref 80.0–100.0)
Platelets: 1055 10*3/uL (ref 150–400)
RBC: 3.76 MIL/uL — ABNORMAL LOW (ref 4.22–5.81)
RDW: 18 % — ABNORMAL HIGH (ref 11.5–15.5)
WBC: 10 10*3/uL (ref 4.0–10.5)
nRBC: 0 % (ref 0.0–0.2)

## 2020-08-19 LAB — PATHOLOGIST SMEAR REVIEW

## 2020-08-19 LAB — TRIGLYCERIDES: Triglycerides: 101 mg/dL (ref <150)

## 2020-08-19 LAB — COMPREHENSIVE METABOLIC PANEL
ALT: 46 U/L — ABNORMAL HIGH (ref 0–44)
AST: 31 U/L (ref 15–41)
Albumin: 2.4 g/dL — ABNORMAL LOW (ref 3.5–5.0)
Alkaline Phosphatase: 138 U/L — ABNORMAL HIGH (ref 38–126)
Anion gap: 9 (ref 5–15)
BUN: 19 mg/dL (ref 6–20)
CO2: 27 mmol/L (ref 22–32)
Calcium: 9 mg/dL (ref 8.9–10.3)
Chloride: 98 mmol/L (ref 98–111)
Creatinine, Ser: 0.86 mg/dL (ref 0.61–1.24)
GFR, Estimated: >60 mL/min (ref >60)
Glucose, Bld: 118 mg/dL — ABNORMAL HIGH (ref 70–99)
Potassium: 4.2 mmol/L (ref 3.5–5.1)
Sodium: 134 mmol/L — ABNORMAL LOW (ref 135–145)
Total Bilirubin: 0.6 mg/dL (ref 0.3–1.2)
Total Protein: 8.7 g/dL — ABNORMAL HIGH (ref 6.5–8.1)

## 2020-08-19 LAB — GLUCOSE, CAPILLARY
Glucose-Capillary: 112 mg/dL — ABNORMAL HIGH (ref 70–99)
Glucose-Capillary: 84 mg/dL (ref 70–99)
Glucose-Capillary: 110 mg/dL — ABNORMAL HIGH (ref 70–99)

## 2020-08-19 LAB — PHOSPHORUS: Phosphorus: 4 mg/dL (ref 2.5–4.6)

## 2020-08-19 LAB — PREALBUMIN: Prealbumin: 20.8 mg/dL (ref 18–38)

## 2020-08-19 LAB — MAGNESIUM: Magnesium: 2 mg/dL (ref 1.7–2.4)

## 2020-08-19 MED ORDER — PHENOL 1.4 % MT LIQD
1.0000 | OROMUCOSAL | Status: DC | PRN
  Administered 2020-08-19: 1 via OROMUCOSAL
  Filled 2020-08-19 (×2): qty 177

## 2020-08-19 MED ORDER — TRAVASOL 10 % IV SOLN
INTRAVENOUS | Status: AC
  Filled 2020-08-19 (×2): qty 1188

## 2020-08-19 MED ORDER — METOPROLOL TARTRATE 5 MG/5ML IV SOLN
2.5000 mg | Freq: Four times a day (QID) | INTRAVENOUS | Status: DC
  Administered 2020-08-19 – 2020-08-20 (×4): 2.5 mg via INTRAVENOUS
  Filled 2020-08-19 (×4): qty 5

## 2020-08-19 NOTE — Progress Notes (Signed)
Patient refused bedside RN to advance NGT post CT recommendation. Relayed patient's refusal to Dr. [name].   Madie Reno, RN

## 2020-08-19 NOTE — Consult Note (Signed)
PHARMACY - TOTAL PARENTERAL NUTRITION CONSULT NOTE   Indication: Small bowel obstruction  Patient Measurements: Height: 5\' 10"  (177.8 cm) Weight: 64 kg (141 lb 1.5 oz) IBW/kg (Calculated) : 73 TPN AdjBW (KG): 65.5 Body mass index is 20.24 kg/m. Usual Weight: 64 kg (current 58kg)  Assessment: [name] is a 24 y.o. 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  Triglycerides and transaminases are back wnl Glucose / Insulin: 103 - 132 mg/dL (0 units SSI required) -   regular insulin 20 units to TPN bag A1C 6.1% Electrolytes: electrolytes WNL Renal: SCr  < 1, stable LFTs / TGs:          AST/ALT WNL,  wnl         TG 106--> 451-->89-->101 Prealbumin / albumin: albumin 3.0 > 2.1 > 2.4>2.3 Intake / Output; MIVF: MIVF: NS at 50 ml/hr  11/14 0701 - 11/15 0700 In: 2139.2 [I.V.:2090.8; IV Piggyback:48.3] Out: 2825 [Urine:100; Emesis/NG output:2100; Drains:25; Stool:600]  Recent GI Imaging:  KUB (08/18/2020) Persistent high-grade small bowel obstruction with a transition point in the right lower quadrant. The distal small bowel is decompressed. Improving intra-abdominal abscesses s/p drain placement. Surgeries / Procedures:  15 Days Post-Op s/p exploratory laparotomy with extensive lysis of adhesions 10 Days Post-Op s/p closure of fascial dehiscence with internal and external retention sutrures Central access: PICC line placed 08/01/20 TPN start date: 08/01/20  Nutritional Goals (per RD recommendation on 10/27): kCal: 2100-2400kcal/day, Protein: 110-120g/day, Fluid: 2.0-2.3L/day Goal TPN rate is 90 mL/hr (provides ~110 g of protein and ~ 2100 kcals per day)  Current Nutrition:  NPO Per surgery note 11/13: high thick bilious output from his NG, well as the persistent dilation of his small bowel noted on  the most recent x-ray  Plan:   Continue current TPN at 90 mL/hr: (total volume 2260 mL)  Protein: 55 g/L=118.8 g/day  Dextrose 302 g (14%)  Lipids: 30 g/L=64.8 g/day  kCal 2151  Electrolytes in TPN: 125 mEq/L of Na, 40 mEq/L of K, 5 mEq/L of Ca, 5 mEq/L of Mg, and 5 mmol/L of Phos. Cl:Ac ratio 1:1   Add standard MVI and trace elements to TPN  continue SSI at sensitive scale (0-9 units) q6h. *Continue regular insulin 20 units to TPN bag  MIVF:  NS at 50 ml/hr   Monitor TPN labs on Mon/Thurs: CMP, Mg, Phosphorus,TG    2152, PharmD, BCPS Clinical Pharmacist 08/19/2020 8:24 AM

## 2020-08-19 NOTE — Progress Notes (Addendum)
Referring Physician(s): Dr. [name]  Supervising Physician: Oley Balm  Patient Status:  [facility] - In-pt  Chief Complaint: Intra-abdominal fluid collection SBO s/p ex lap with extensive lysis of adhesions  Subjective: Patient resting comfortably.  Reports frustration with care and length of stay.  No complaints related to drain.   Allergies: Lactose intolerance (gi)  Medications: Prior to Admission medications   Medication Sig Start Date End Date Taking? Authorizing Provider  Ensure (ENSURE) Take 237 mLs by mouth.    [provider]     Vital Signs: BP 125/90 (BP Location: Left Arm)   Pulse (!) 120   Temp 97.9 F (36.6 C) (Oral)   Resp 16   Ht 5\' 10"  (1.778 m)   Wt 122 lb 12.7 oz (55.7 kg)   SpO2 100%   BMI 17.62 kg/m   Physical Exam  NAD, alert, resting comfortably.  Abdomen: soft, mildly distended. LLQ drain in place. Insertion site c/d/i. Small amount of serous output from bulb.  Flushes and aspirates easily.   Imaging: CT ABDOMEN PELVIS W CONTRAST  Result Date: 08/18/2020 CLINICAL DATA:  Abdominal distension. EXAM: CT ABDOMEN AND PELVIS WITH CONTRAST TECHNIQUE: Multidetector CT imaging of the abdomen and pelvis was performed using the standard protocol following bolus administration of intravenous contrast. CONTRAST:  08/20/2020 OMNIPAQUE IOHEXOL 300 MG/ML  SOLN COMPARISON:  CT dated August 12, 2020. FINDINGS: Lower chest: There is atelectasis at the lung bases.The heart size is normal. Hepatobiliary: The liver is normal. Normal gallbladder.There is no biliary ductal dilation. Pancreas: Normal contours without ductal dilatation. No peripancreatic fluid collection. Spleen: Unremarkable. Adrenals/Urinary Tract: --Adrenal glands: Unremarkable. --Right kidney/ureter: No hydronephrosis or radiopaque kidney stones. --Left kidney/ureter: No hydronephrosis or radiopaque kidney stones. --Urinary bladder: Unremarkable. Stomach/Bowel: --Stomach/Duodenum: The stomach  is significantly dilated with an air-fluid level. The esophagus is dilated and filled with fluid. There is an enteric tube the tip of which barely terminates in the stomach. The side hole is within the esophagus. --Small bowel: There is significant small bowel dilatation with multiple small bowel loops measuring up to approximately 5-6 cm in diameter. There is a transition point in the right lower quadrant proximal to the right lower quadrant ileostomy. --Colon: The patient is status post prior total colectomy. There is contrast noted within the residual rectum that creates significant streak artifact. --Appendix: Surgically absent Vascular/Lymphatic: Normal course and caliber of the major abdominal vessels. --No retroperitoneal lymphadenopathy. --No mesenteric lymphadenopathy. --No pelvic or inguinal lymphadenopathy. Reproductive: Unremarkable Other: There are few pockets of free air in the abdomen. The patient has undergone interval placement of a percutaneous drain in the low abdomen. The collection at this level has significantly decreased in size now measuring approximately 7.8 x 3 cm. This collection has herniated into the patient's umbilical hernia. This collection extends across midline. There is a residual collection along the patient's right flank measuring approximately 7.3 x 1.7 x 14 cm. Again, this collection appears to extend across midline and connects with the dominant collection containing the percutaneous drain. There is a small residual collection in the left upper quadrant that is significantly decreased in size and now measures approximately 4.5 x 4.7 cm Musculoskeletal. No acute displaced fractures. IMPRESSION: 1. Persistent high-grade small bowel obstruction with a transition point in the right lower quadrant. The distal small bowel is decompressed. 2. Suboptimal positioning of the NG tube. There is significant distention of the stomach and esophagus. Tube malfunction is suspected. Tube  replacement and/or repositioning is strongly recommended. 3.  Improving intra-abdominal abscesses status post drain placement. Residual collections are noted as detailed above. 4. Additional chronic findings as detailed above. These results will be called to the ordering clinician or representative by the Radiologist Assistant, and communication documented in the PACS or [employer]. Electronically Signed   By: Katherine Mantle M.D.   On: 08/18/2020 22:11   DG Chest Port 1 View  Result Date: 08/16/2020 CLINICAL DATA:  PICC line placement EXAM: PORTABLE CHEST 1 VIEW COMPARISON:  07/30/2020, 08/16/2020 FINDINGS: Frontal view of the chest demonstrates interval placement right-sided PICC tip overlying superior vena cava. Enteric catheter tip projects over the gastric fundus. The side port remains above the level of the gastroesophageal junction, and should be advanced at least 3 cm to ensure placement within the gastric lumen. Cardiac silhouette is stable. Chronic elevation right hemidiaphragm. No airspace disease, effusion, or pneumothorax. Stable diffuse gaseous distention of the bowel. IMPRESSION: 1. No complication after right-sided PICC placement. 2. Stable enteric catheter, tip projecting just above the gastroesophageal junction. 3. No acute intrathoracic process. 4. Persistent small bowel distension compatible with ileus. Electronically Signed   By: Sharlet Salina M.D.   On: 08/16/2020 23:41   DG Abd Portable 2V  Result Date: 08/17/2020 CLINICAL DATA:  Small bowel obstruction EXAM: PORTABLE ABDOMEN - 2 VIEW COMPARISON:  August 16, 2020 FINDINGS: Supine and upright images obtained. Nasogastric tube tip is in the proximal stomach with the side port above the gastroesophageal junction. There is diffuse bowel dilatation without appreciable air-fluid levels. Right lower quadrant ostomy noted. Drain in pelvis placed from a leftward aspect. Contrast noted in rectum, unchanged. Mild right base  atelectasis. Lung bases otherwise clear. IMPRESSION: Nasogastric tube side port is above the gastroesophageal junction. Advise advancing nasogastric tube 5-6 cm. Persistent bowel dilatation noted diffusely without air-fluid levels. No free air appreciable. Right lower quadrant ostomy. Drain in pelvis from a leftward approach, stable. Electronically Signed   By: Bretta Bang III M.D.   On: 08/17/2020 10:38   DG Abd Portable 2V  Result Date: 08/16/2020 CLINICAL DATA:  Follow-up ileus EXAM: PORTABLE ABDOMEN - 2 VIEW COMPARISON:  08/15/2020 FINDINGS: Gastric catheter is noted with the tip in the stomach although the proximal side port lies in the distal esophagus. This should be advanced further into the stomach. Persistent dilatation of the small bowel is noted. Right lower quadrant ostomy is seen. Drainage catheter is noted in the pelvis consistent with the recent intervention. No free air is seen. IMPRESSION: Persistent small bowel dilatation likely related ileus. Drainage catheter is noted inferiorly. No free air is noted. Gastric catheter should be advanced further into the stomach. Electronically Signed   By: Alcide Clever M.D.   On: 08/16/2020 08:12    Labs:  CBC: Recent Labs    08/12/20 0505 08/14/20 0434 08/17/20 0437 08/19/20 0552  WBC 21.4* 9.7 11.5* 10.0  HGB 8.4* 8.0* 8.3* 8.5*  HCT 26.1* 25.0* 26.4* 26.5*  PLT 906* 1,129* 1,175* 1,055*    COAGS: Recent Labs    07/28/20 2106  INR 1.1    BMP: Recent Labs    08/14/20 0434 08/15/20 0627 08/17/20 0437 08/19/20 0552  NA 133* 136 137 134*  K 4.4 4.2 4.3 4.2  CL 100 100 99 98  CO2 25 28 25 27   GLUCOSE 108* 88 96 118*  BUN 17 22* 20 19  CALCIUM 8.2* 8.7* 8.7* 9.0  CREATININE 0.66 0.80 0.76 0.86  GFRNONAA >60 >60 >60 >60    LIVER FUNCTION  TESTS: Recent Labs    08/13/20 0529 08/15/20 0627 08/17/20 0437 08/19/20 0552  BILITOT 0.5 0.9 0.9 0.6  AST 19 23 34 31  ALT 27 30 41 46*  ALKPHOS 125 153* 145* 138*   PROT 6.7 8.0 8.1 8.7*  ALBUMIN 2.0* 2.3* 2.3* 2.4*    Assessment and Plan: Intra-abdominal fluid collection s/p left-sided pelvic drain placement 08/12/20 by Dr. Deanne Coffer IR contacted by surgery to discuss drain management.  Output has been small from his left drain, patient slow to recover.  Drain assessed at bedside. Flushes easily with aspiration of combination of thin, serous and chunky output.  Drain does not appear to be clogged and output does not appear to be too thick for the drain.  Reviewed CT imaging with Dr. Deanne Coffer who notes improvement in fluid collection since placement. Drain positioned well within the dependent portion of the collection. Could consider aggressive flushing vs. tPA to encourage output.  Discussed with surgery PA. Plan to aggressively flush today and continue with current drain management.   Update 1445:  PA to bedside for additional flushing.  Irrigated tubing with several 10 mL sterile saline syringes.  Aspirated saline and connected back to bulb suction.  Patient tolerated well.   Electronically Signed: Hoyt Koch, PA 08/19/2020, 11:57 AM   I spent a total of 15 Minutes at the the patient's bedside AND on the patient's hospital floor or unit, greater than 50% of which was counseling/coordinating care for intra-abdominal fluid collection

## 2020-08-19 NOTE — Progress Notes (Signed)
[facility] SURGICAL PROGRESS NOTE  Hospital Day(s): 22.   Post op day(s): 10 Days Post-Op.   Interval History:  Patient seen and examined no acute events or new complaints overnight.  Still remains tachycardic 110 - 120s Patient reports he is doing okay this morning No leukocytosis, WBC normal at 10.0K Continues to have thrombocytosis to 1,055 Renal function normal, sCr - 0.86, UO  Mild hyponatremia to 134, o/w no electrolyte derangements  NGT output still remains high; 2.1L, using suction intermittently last night after drinking contrast Ileostomy with 600 ccs out in the last 24 hours Percutaneous drain with 20 ccs out: serous Cx from this on 11/08 growing multiple organisms; none predominant He remains on Zosyn  Has not been mobilizing much secondary to wound dehiscence    Vital signs in last 24 hours: [min-max] current  Temp:  [98.3 F (36.8 C)-99.3 F (37.4 C)] 99.2 F (37.3 C) (11/15 0511) Pulse Rate:  [117-129] 129 (11/15 0511) Resp:  [13-16] 16 (11/15 0511) BP: (123-135)/(79-86) 135/86 (11/15 0511) SpO2:  [100 %] 100 % (11/15 0511) Weight:  [55.7 kg] 55.7 kg (11/15 0525)     Height: 5\' 10"  (177.8 cm) Weight: 55.7 kg BMI (Calculated): 17.62   Intake/Output last 2 shifts:  11/14 0701 - 11/15 0700 In: 2139.2 [I.V.:2090.8; IV Piggyback:48.3] Out: 2825 [Urine:100; Emesis/NG output:2100; Drains:25; Stool:600]   Physical Exam:  Constitutional: alert, cooperative and no distress HEENT:NGT in place Respiratory: breathing non-labored at rest  Cardiovascular:Tachycardiaand sinus rhythm  Gastrointestinal:Soft, expected incisional soreness, no appreciable distension, no rebound/guarding, ileostomy in the RLQ with gas and stool in bag. JP drain in LLQ with serous appearing output Integumentary:Midline laparotomy healingvia secondary intention, the inferior half of this incision hs retention sutures present both internal and external, the superior  portion is granulation tissue healing via secondary intention. There is some serous drainage from the inferior portion of the wound.   Labs:  CBC Latest Ref Rng & Units 08/17/2020 08/14/2020 08/12/2020  WBC 4.0 - 10.5 K/uL 11.5(H) 9.7 21.4(H)  Hemoglobin 13.0 - 17.0 g/dL 8.3(L) 8.0(L) 8.4(L)  Hematocrit 39 - 52 % 26.4(L) 25.0(L) 26.1(L)  Platelets 150 - 400 K/uL 1,175(HH) 1,129(HH) 906(HH)   CMP Latest Ref Rng & Units 08/17/2020 08/15/2020 08/14/2020  Glucose 70 - 99 mg/dL 96 88 13/07/2020)  BUN 6 - 20 mg/dL 20 250(N) 17  Creatinine 0.61 - 1.24 mg/dL 39(J 6.73 4.19  Sodium 135 - 145 mmol/L 137 136 133(L)  Potassium 3.5 - 5.1 mmol/L 4.3 4.2 4.4  Chloride 98 - 111 mmol/L 99 100 100  CO2 22 - 32 mmol/L 25 28 25   Calcium 8.9 - 10.3 mg/dL 3.79) ) 0.2(I)  Total Protein 6.5 - 8.1 g/dL 8.1 8.0 -  Total Bilirubin 0.3 - 1.2 mg/dL 0.9 0.9 -  Alkaline Phos 38 - 126 U/L 145(H) 153(H) -  AST 15 - 41 U/L 34 23 -  ALT 0 - 44 U/L 41 30 -    Imaging studies:  CT Abdomen/Pelvis (08/18/2020) personally reviewed still with marked distension of the stomach and small bowel, previous intra-abdominal fluid collection decreased in size, and radiologist report reviewed:  IMPRESSION: 1. Persistent high-grade small bowel obstruction with a transition point in the right lower quadrant. The distal small bowel is decompressed. 2. Suboptimal positioning of the NG tube. There is significant distention of the stomach and esophagus. Tube malfunction is suspected. Tube replacement and/or repositioning is strongly recommended. 3. Improving intra-abdominal abscesses status post drain placement. Residual collections are noted  as detailed above. 4. Additional chronic findings as detailed above.   Assessment/Plan:  24 y.o. male 10 Days Post-Op s/p closure of fascial dehiscence with internal and external retention sutrures 15 Days Post-Ops/p exploratory laparotomy and lysis of adhesions for small bowel obstruction  secondary to internal hernia, complicated by pertinent comorbidities includings/p subtotal colectomy for reportedly Hirschsprung's disease however he does nothave a formal diagnosis of this and was pending outpatient work up with GI.   - Continue NPO; okay for sips of clears for comfort - Continue to use NGTto LIS as needed; monitor and record output - Continue maintenance fluids; NS 50 ml/hr; he has lots of GI losses -Continue TPN todayat goal rate and current formulation; lipids are under goal but he has been able to maintain triglycerides at normal levels - Continue local wound care: wet to dry dressing with vaseline daily+ prn, rentention sutures present; I did change this today  - Monitor abdominal examination; on-going bowel function             - Continue drain; record output             - Continue ABx for now (Zosyn); follow up Cx from placement on 11/08 - Pain control prn; antiemetics prn - Monitor leukocytosis; resolved  - Monitor thrombocytosis; improved minimally -Chair and bathroom privileges  - DVT prophylaxis   All of the above findings and recommendations were discussed with the patient, and the medical team, and all of patient's questions were answered to her expressed satisfaction.  -- Lynden Oxford, PA-C [facility] 08/19/2020, 7:01 AM [phone] M-F: 7am - 4pm

## 2020-08-19 NOTE — Progress Notes (Signed)
NG tube returned to LIS as pt requested at 0230 am. At 0650 pt requested NG tube to be clamped.

## 2020-08-20 LAB — GLUCOSE, CAPILLARY
Glucose-Capillary: 94 mg/dL (ref 70–99)
Glucose-Capillary: 97 mg/dL (ref 70–99)

## 2020-08-20 MED ORDER — ACETAMINOPHEN 500 MG PO TABS
1000.0000 mg | ORAL_TABLET | Freq: Four times a day (QID) | ORAL | Status: DC
  Administered 2020-08-20 – 2020-08-21 (×2): 1000 mg via ORAL
  Filled 2020-08-20 (×10): qty 2

## 2020-08-20 MED ORDER — PROPRANOLOL HCL 10 MG PO TABS
10.0000 mg | ORAL_TABLET | Freq: Four times a day (QID) | ORAL | Status: DC
  Administered 2020-08-20 – 2020-08-25 (×21): 10 mg via ORAL
  Filled 2020-08-20 (×27): qty 1

## 2020-08-20 MED ORDER — TRAVASOL 10 % IV SOLN
INTRAVENOUS | Status: AC
  Filled 2020-08-20: qty 1188

## 2020-08-20 NOTE — Progress Notes (Signed)
Nutrition Follow-up  DOCUMENTATION CODES:   Severe malnutrition in context of acute illness/injury  INTERVENTION:   TPN per pharmacy   NUTRITION DIAGNOSIS:   Severe Malnutrition related to acute illness as evidenced by moderate fat depletion, severe fat depletion, moderate muscle depletion, severe muscle depletion, 6 percent weight loss in 1 month.  GOAL:   Patient will meet greater than or equal to 90% of their needs - met with TPN   MONITOR:   Diet advancement, Labs, Weight trends, Skin, I & O's, Other (Comment) (TPN)  ASSESSMENT:   24 y.o. 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 Pt remains NPO. NGT in place with 111m output. Drain with 243moutput. UOP [id number] chart, pt is down 20lbs(14%) since admit. Pt previously weight stable for the past 10 days so unsure if most current weights are accurate.      Medications reviewed and include: lovenox, insulin, reglan, pepcid, zosyn, NaCl @50ml /hr  Labs reviewed: Na 134(L), K 4.2 wnl, P 4.0 wnl, Mg 2.0 wnl, alk phos- 138(H), AST 46(H)-11/15 Pre-albumin- 20.8- 11/15 Triglycerides- 101- 11/15 Wbc- Hgb 8.5(L), Hct 26.5(L), MCV 70.5(L), MCH 22.6(L) cbgs- 94, 84, 112, 110 x 24 hrs  Diet Order:   Diet Order            Diet clear liquid Room service appropriate? Yes; Fluid consistency: Thin  Diet effective now                EDUCATION NEEDS:   Education needs have been addressed  Skin:  Skin Assessment: Reviewed RN Assessment  Last BM:  11/16- [id number] ostomy  Height:   Ht Readings from Last 1 Encounters:  07/29/20 5' 10"  (1.778 m)    Weight:   Wt Readings from Last 1 Encounters:  08/20/20 56.3 kg    Ideal Body Weight:   75.45 kg  BMI:  Body mass index is 17.81 kg/m.  Estimated Nutritional Needs:   Kcal:  2100-2400kcal/day  Protein:  110-120g/day  Fluid:  2.0-2.3L/day  CaseKoleen Distance RD, LDN Please refer to [facility] and/or RD on-call/weekend/after hours pager

## 2020-08-20 NOTE — Progress Notes (Signed)
CC: SBO Subjective: Feeling better, tolerated clamping trial Drain 20cc serous  Good ileostomy output  Tachycardia improving  Objective: Vital signs in last 24 hours: Temp:  [97.9 F (36.6 C)-99.1 F (37.3 C)] 98.4 F (36.9 C) (11/16 0808) Pulse Rate:  [105-113] 105 (11/16 0808) Resp:  [14-18] 16 (11/16 0808) BP: (106-117)/(64-85) 106/64 (11/16 0808) SpO2:  [96 %-100 %] 100 % (11/16 0808) Weight:  [56.3 kg] 56.3 kg (11/16 0458) Last BM Date: 08/20/20  Intake/Output from previous day: 11/15 0701 - 11/16 0700 In: 2395.1 [P.O.:150; I.V.:2245.1] Out: 1736 [Urine:875; Emesis/NG output:160; Drains:20; Stool:681] Intake/Output this shift: Total I/O In: 540 [I.V.:540] Out: 350 [Urine:350]  Physical exam:  NAD Abd: upper portion healing by secondary intention, lower half w skin sutures and external retention sutures, closure is holding. Ileostomy w bilious output. No infection, clear JP  Lab Results: CBC  Recent Labs    08/19/20 0552  WBC 10.0  HGB 8.5*  HCT 26.5*  PLT 1,055*   BMET Recent Labs    08/19/20 0552  NA 134*  K 4.2  CL 98  CO2 27  GLUCOSE 118*  BUN 19  CREATININE 0.86  CALCIUM 9.0   PT/INR No results for input(s): LABPROT, INR in the last 72 hours. ABG No results for input(s): PHART, HCO3 in the last 72 hours.  Invalid input(s): PCO2, PO2  Studies/Results: CT ABDOMEN PELVIS W CONTRAST  Result Date: 08/18/2020 CLINICAL DATA:  Abdominal distension. EXAM: CT ABDOMEN AND PELVIS WITH CONTRAST TECHNIQUE: Multidetector CT imaging of the abdomen and pelvis was performed using the standard protocol following bolus administration of intravenous contrast. CONTRAST:  OMNIPAQUE IOHEXOL 300 MG/ML  SOLN COMPARISON:  CT dated August 12, 2020. FINDINGS: Lower chest: There is atelectasis at the lung bases.The heart size is normal. Hepatobiliary: The liver is normal. Normal gallbladder.There is no biliary ductal dilation. Pancreas: Normal contours without  ductal dilatation. No peripancreatic fluid collection. Spleen: Unremarkable. Adrenals/Urinary Tract: --Adrenal glands: Unremarkable. --Right kidney/ureter: No hydronephrosis or radiopaque kidney stones. --Left kidney/ureter: No hydronephrosis or radiopaque kidney stones. --Urinary bladder: Unremarkable. Stomach/Bowel: --Stomach/Duodenum: The stomach is significantly dilated with an air-fluid level. The esophagus is dilated and filled with fluid. There is an enteric tube the tip of which barely terminates in the stomach. The side hole is within the esophagus. --Small bowel: There is significant small bowel dilatation with multiple small bowel loops measuring up to approximately 5-6 cm in diameter. There is a transition point in the right lower quadrant proximal to the right lower quadrant ileostomy. --Colon: The patient is status post prior total colectomy. There is contrast noted within the residual rectum that creates significant streak artifact. --Appendix: Surgically absent Vascular/Lymphatic: Normal course and caliber of the major abdominal vessels. --No retroperitoneal lymphadenopathy. --No mesenteric lymphadenopathy. --No pelvic or inguinal lymphadenopathy. Reproductive: Unremarkable Other: There are few pockets of free air in the abdomen. The patient has undergone interval placement of a percutaneous drain in the low abdomen. The collection at this level has significantly decreased in size now measuring approximately 7.8 x 3 cm. This collection has herniated into the patient's umbilical hernia. This collection extends across midline. There is a residual collection along the patient's right flank measuring approximately 7.3 x 1.7 x 14 cm. Again, this collection appears to extend across midline and connects with the dominant collection containing the percutaneous drain. There is a small residual collection in the left upper quadrant that is significantly decreased in size and now measures approximately 4.5 x  4.7 cm  Musculoskeletal. No acute displaced fractures. IMPRESSION: 1. Persistent high-grade small bowel obstruction with a transition point in the right lower quadrant. The distal small bowel is decompressed. 2. Suboptimal positioning of the NG tube. There is significant distention of the stomach and esophagus. Tube malfunction is suspected. Tube replacement and/or repositioning is strongly recommended. 3. Improving intra-abdominal abscesses status post drain placement. Residual collections are noted as detailed above. 4. Additional chronic findings as detailed above. These results will be called to the ordering clinician or representative by the Radiologist Assistant, and communication documented in the PACS or [employer]. Electronically Signed   By: Katherine Mantle M.D.   On: 08/18/2020 22:11    Anti-infectives: Anti-infectives (From admission, onward)   Start     Dose/Rate Route Frequency Ordered Stop   08/12/20 0700  piperacillin-tazobactam (ZOSYN) IVPB 3.375 g        3.375 g 12.5 mL/hr over 240 Minutes Intravenous Every 8 hours 08/12/20 0605     08/09/20 1100  cefoTEtan (CEFOTAN) 2 g in sodium chloride 0.9 % 100 mL IVPB        2 g 200 mL/hr over 30 Minutes Intravenous Every 12 hours 08/09/20 0957 08/10/20 1024   08/02/20 1530  metroNIDAZOLE (FLAGYL) IVPB 500 mg  Status:  Discontinued        500 mg 100 mL/hr over 60 Minutes Intravenous  Once 08/02/20 1523 08/02/20 2010   08/02/20 1130  cefoTEtan (CEFOTAN) 2 g in sodium chloride 0.9 % 100 mL IVPB  Status:  Discontinued       Note to Pharmacy: On call to OR, send with the pt to the OR   2 g 200 mL/hr over 30 Minutes Intravenous Every 12 hours 08/02/20 0952 08/05/20 0857      Assessment/Plan:  Slowly improving Mobilize Continue TPN and A/bs for now ( gram + and Gram Neg in culture) May need yo upsize drain as is not draining much as you might expect CLD PO B blocker   Sterling Big, MD, FACS  08/20/2020

## 2020-08-20 NOTE — Progress Notes (Signed)
Patient requested to be hooked back up to NGT suction. Per patient not as nearly as nauseous as before, but started feeling  a bit uncomfortable.   Madie Reno, RN

## 2020-08-20 NOTE — Plan of Care (Addendum)
Pt Axox4. Calm and cooperative and able to voice his needs. SR-ST high 90's-low 100's on tele. HR controlled with scheduled Metoprolol IV. Prn Dilaudid administered twice overnight alternating with Oxycodone. NGT remained clamped all night. Pt denied any nausea, vomiting nor abdominal pain. On IV Protonix, IV Abx, IV Reglan, IV Protonix, IVF and TPN. Ileostomy site pink and intact. Pouch with green loose output. Left abdominal JP to gravity drain with scant amount of output, flushed with 73ml of sterile NS. Abdominal dressing clean dry and intact. Pt encouraged to TCDB, and ambulate. Vitals stable. Will continue to monitor.  Problem: Education: Goal: Knowledge of General Education information will improve Description: Including pain rating scale, medication(s)/side effects and non-pharmacologic comfort measures Outcome: Progressing   Problem: Health Behavior/Discharge Planning: Goal: Ability to manage health-related needs will improve Outcome: Progressing   Problem: Clinical Measurements: Goal: Ability to maintain clinical measurements within normal limits will improve Outcome: Progressing Goal: Will remain free from infection Outcome: Progressing Goal: Diagnostic test results will improve Outcome: Progressing Goal: Respiratory complications will improve Outcome: Progressing Goal: Cardiovascular complication will be avoided Outcome: Progressing   Problem: Activity: Goal: Risk for activity intolerance will decrease Outcome: Progressing   Problem: Nutrition: Goal: Adequate nutrition will be maintained Outcome: Progressing   Problem: Coping: Goal: Level of anxiety will decrease Outcome: Progressing   Problem: Elimination: Goal: Will not experience complications related to bowel motility Outcome: Progressing Goal: Will not experience complications related to urinary retention Outcome: Progressing   Problem: Pain Managment: Goal: General experience of comfort will  improve Outcome: Progressing   Problem: Safety: Goal: Ability to remain free from injury will improve Outcome: Progressing   Problem: Skin Integrity: Goal: Risk for impaired skin integrity will decrease Outcome: Progressing   Problem: Education: Goal: Required Educational Video(s) Outcome: Progressing   Problem: Clinical Measurements: Goal: Postoperative complications will be avoided or minimized Outcome: Progressing   Problem: Skin Integrity: Goal: Demonstration of wound healing without infection will improve Outcome: Progressing

## 2020-08-20 NOTE — Consult Note (Signed)
PHARMACY - TOTAL PARENTERAL NUTRITION CONSULT NOTE   Indication: Small bowel obstruction  Patient Measurements: Height: 5\' 10"  (177.8 cm) Weight: 64 kg (141 lb 1.5 oz) IBW/kg (Calculated) : 73 TPN AdjBW (KG): 65.5 Body mass index is 20.24 kg/m. Usual Weight: 64 kg (current 58kg)  Assessment: Thomas Rivas is a 24 y.o. 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  Triglycerides and transaminases are back wnl Glucose / Insulin: 84 - 110 mg/dL (0 units SSI required) -   regular insulin 20 units to TPN bag A1C 6.1% Electrolytes: electrolytes WNL Renal: SCr  < 1, stable LFTs / TGs:          AST/ALT WNL,  wnl         TG 106--> 451-->89-->101 Prealbumin / albumin: albumin 3.0 > 2.1 > 2.4>2.3 Intake / Output; MIVF: MIVF: NS at 50 ml/hr  11/14 0701 - 11/15 0700 In: 2395 [I.V.:2395; po 150+] Out: 1736 [Urine:875; Emesis/NG output:160; Drains:20; Stool:6080+]  Recent GI Imaging:  KUB (08/18/2020) Persistent high-grade small bowel obstruction with a transition point in the right lower quadrant. The distal small bowel is decompressed. Improving intra-abdominal abscesses s/p drain placement. Surgeries / Procedures:  16 Days Post-Op s/p exploratory laparotomy with extensive lysis of adhesions 11 Days Post-Op s/p closure of fascial dehiscence with internal and external retention sutrures Central access: PICC line placed 08/01/20 TPN start date: 08/01/20  Nutritional Goals (per RD recommendation on 10/27): kCal: 2100-2400kcal/day, Protein: 110-120g/day, Fluid: 2.0-2.3L/day Goal TPN rate is 90 mL/hr (provides ~110 g of protein and ~ 2100 kcals per day)  Current Nutrition:  NPO Per surgery note 11/13: high thick bilious output from his NG, well as the persistent dilation of his small bowel noted on the most recent  x-ray  Plan:   Continue current TPN at 90 mL/hr: (total volume 2260 mL)  Protein: 55 g/L=118.8 g/day  Dextrose 302 g (14%)  Lipids: 30 g/L=64.8 g/day  kCal 2151  Electrolytes in TPN: 125 mEq/L of Na, 40 mEq/L of K, 5 mEq/L of Ca, 5 mEq/L of Mg, and 5 mmol/L of Phos. Cl:Ac ratio 1:1   Add standard MVI and trace elements to TPN  continue SSI at sensitive scale (0-9 units) q6h. *Continue regular insulin 20 units to TPN bag  MIVF:  NS at 50 ml/hr   Monitor TPN labs on Mon/Thurs: CMP, Mg, Phosphorus,TG    2152, PharmD, BCPS Clinical Pharmacist 08/20/2020 7:08 AM

## 2020-08-20 NOTE — Progress Notes (Signed)
Patient started on clear liquids today. Patient stated he doesn't really have an appetite. Tried a few sips of broth and became full.   Madie Reno, RN

## 2020-08-21 ENCOUNTER — Inpatient Hospital Stay: Payer: Medicaid Other

## 2020-08-21 LAB — GLUCOSE, CAPILLARY
Glucose-Capillary: 100 mg/dL — ABNORMAL HIGH (ref 70–99)
Glucose-Capillary: 119 mg/dL — ABNORMAL HIGH (ref 70–99)
Glucose-Capillary: 97 mg/dL (ref 70–99)
Glucose-Capillary: 97 mg/dL (ref 70–99)
Glucose-Capillary: 99 mg/dL (ref 70–99)

## 2020-08-21 LAB — CBC
HCT: 28.6 % — ABNORMAL LOW (ref 39.0–52.0)
Hemoglobin: 8.9 g/dL — ABNORMAL LOW (ref 13.0–17.0)
MCH: 22.4 pg — ABNORMAL LOW (ref 26.0–34.0)
MCHC: 31.1 g/dL (ref 30.0–36.0)
MCV: 71.9 fL — ABNORMAL LOW (ref 80.0–100.0)
Platelets: 927 10*3/uL (ref 150–400)
RBC: 3.98 MIL/uL — ABNORMAL LOW (ref 4.22–5.81)
RDW: 17.8 % — ABNORMAL HIGH (ref 11.5–15.5)
WBC: 8 10*3/uL (ref 4.0–10.5)
nRBC: 0 % (ref 0.0–0.2)

## 2020-08-21 MED ORDER — SODIUM CHLORIDE 0.9 % IV SOLN
250.0000 mg | Freq: Four times a day (QID) | INTRAVENOUS | Status: DC
  Administered 2020-08-21 – 2020-08-26 (×19): 250 mg via INTRAVENOUS
  Filled 2020-08-21 (×27): qty 5

## 2020-08-21 MED ORDER — FENTANYL 12 MCG/HR TD PT72
1.0000 | MEDICATED_PATCH | TRANSDERMAL | Status: DC
  Administered 2020-08-21: 1 via TRANSDERMAL
  Filled 2020-08-21: qty 1

## 2020-08-21 MED ORDER — TRAVASOL 10 % IV SOLN
INTRAVENOUS | Status: AC
  Filled 2020-08-21: qty 1188

## 2020-08-21 MED ORDER — METOCLOPRAMIDE HCL 5 MG/ML IJ SOLN
10.0000 mg | Freq: Four times a day (QID) | INTRAMUSCULAR | Status: DC
  Administered 2020-08-21 – 2020-08-26 (×20): 10 mg via INTRAVENOUS
  Filled 2020-08-21 (×21): qty 2

## 2020-08-21 MED ORDER — SODIUM CHLORIDE 0.9 % IV SOLN: 500.0000 mg | Freq: Four times a day (QID) | INTRAVENOUS | Status: DC

## 2020-08-21 NOTE — Plan of Care (Signed)
Dr. [name] made aware via IM that patient did not want staff to change his dressing as he only wants MD to do it.

## 2020-08-21 NOTE — Progress Notes (Signed)
Drain assessed at bedside due to report of scant drainage - bulb suction and drain intact with trace serosanguinous fluid in bulb.  Drain flushed aggressively with 30 mL saline, with prompt brisk return of serosanguinous fluid into bulb.  Per most recent CT (11/14), drain is well positioned, and function confirmed today.  Repeat non-contrast CT pelvis could be considered to assess for continued decrease of fluid collection.  Given sustained normal WBC and fluid characteristics, low suspicion for worsening abscess.  At this time, no indication for drain upsize or repositioning.    Marliss Coots, MD Pager: [phone]

## 2020-08-21 NOTE — Consult Note (Signed)
PHARMACY - TOTAL PARENTERAL NUTRITION CONSULT NOTE   Indication: Small bowel obstruction  Patient Measurements: Height: 5\' 10"  (177.8 cm) Weight: 64 kg (141 lb 1.5 oz) IBW/kg (Calculated) : 73 TPN AdjBW (KG): 65.5 Body mass index is 20.24 kg/m. Usual Weight: 64 kg (current 58kg)  Assessment: Thomas Rivas is a 24 y.o. 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  Triglycerides and transaminases are back wnl  Glucose / Insulin: 97 - 97  mg/dL (0 units SSI required) -   regular insulin 20 units to TPN bag A1C 6.1% Electrolytes: electrolytes WNL Renal: SCr  < 1, stable LFTs / TGs:          AST/ALT WNL,  wnl         TG 106--> 451-->89-->101 Prealbumin / albumin: albumin 3.0 > 2.1 > 2.4>2.3 Intake / Output; MIVF: MIVF: NS at 50 ml/hr  Recent GI Imaging:  KUB (08/21/2020) Evidence of ongoing small bowel obstruction Surgeries / Procedures:  17 Days Post-Op s/p exploratory laparotomy with extensive lysis of adhesions 12 Days Post-Op s/p closure of fascial dehiscence with internal and external retention sutrures Central access: PICC line placed 08/01/20 TPN start date: 08/01/20  Nutritional Goals (per RD recommendation on 10/27): kCal: 2100-2400kcal/day, Protein: 110-120g/day, Fluid: 2.0-2.3L/day Goal TPN rate is 90 mL/hr (provides ~110 g of protein and ~ 2100 kcals per day)  Current Nutrition:  CLD  Plan:   Increase lipid content in current TPN and continue at 90 mL/hr: (total volume 2260 mL)  Protein: 55 g/L=118.8 g/day  Dextrose 302.4 g (14%)  Lipids: 37 g/L=80.0 g/day  kCal 2303.4  Electrolytes in TPN: 95 mEq/L of Na, 40 mEq/L of K, 5 mEq/L of Ca, 5 mEq/L of Mg, and 5 mmol/L of Phos. Cl:Ac ratio 1:1   Add standard MVI and trace elements to TPN  continue SSI at sensitive scale (0-9  units) q6h   Decrease regular insulin in TPN to 10 units   MIVF:  NS at 50 ml/hr   Monitor TPN labs on Mon/Thurs: CMP, Mg, Phosphorus,TG    11/27, PharmD, BCPS Clinical Pharmacist 08/21/2020 7:13 AM

## 2020-08-21 NOTE — Progress Notes (Signed)
CC: SBO Subjective:  Doing better HR improving Almost 2 lts out from ileostomy  Objective: Vital signs in last 24 hours: Temp:  [98.3 F (36.8 C)-99 F (37.2 C)] 98.5 F (36.9 C) (11/17 1209) Pulse Rate:  [91-104] 91 (11/17 1209) Resp:  [16-18] 18 (11/17 1209) BP: (103-109)/(66-78) 105/74 (11/17 1209) SpO2:  [99 %-100 %] 100 % (11/17 1209) Weight:  [55.1 kg] 55.1 kg (11/17 0439) Last BM Date: 08/21/20  Intake/Output from previous day: 11/16 0701 - 11/17 0700 In: 1307.9 [P.O.:240; I.V.:1067.9] Out: 3850 [Urine:1050; Emesis/NG output:850; Drains:20; Stool:1930] Intake/Output this shift: Total I/O In: 0  Out: 10 [Drains:10]  Physical exam: NAD Thomas Rivas portion healing by secondary intention vaseline gauze change, lower half w skin sutures and external retention sutures, closure is holding. Ileostomy w bilious output. No infection, clear JP  Lab Results: CBC  Recent Labs    08/19/20 0552 08/21/20 0454  WBC 10.0 8.0  HGB 8.5* 8.9*  HCT 26.5* 28.6*  PLT 1,055* 927*   BMET Recent Labs    08/19/20 0552  NA 134*  K 4.2  CL 98  CO2 27  GLUCOSE 118*  BUN 19  CREATININE 0.86  CALCIUM 9.0   PT/INR No results for input(s): LABPROT, INR in the last 72 hours. ABG No results for input(s): PHART, HCO3 in the last 72 hours.  Invalid input(s): PCO2, PO2  Studies/Results: DG Abd Portable 2V  Result Date: 08/21/2020 CLINICAL DATA:  24 year old male with small bowel obstruction EXAM: PORTABLE ABDOMEN - 2 VIEW COMPARISON:  CT 08/18/2020, plain 08/17/2020, 03/15/2020, barium enema 05/10/2020 FINDINGS: Similar appearance of gas filled small bowel. Retained barium contrast within the rectal pouch. Pigtail drainage catheter projects over the pelvis. Gastric tube terminates with the tip in the left upper quadrant and the side port above the GE junction. Overlying EKG leads. Ostomy apparatus in the right lower quadrant No displaced fracture IMPRESSION: Evidence of ongoing small  bowel obstruction. Electronically Signed   By: Thomas Rivas D.O.   On: 08/21/2020 08:28    Anti-infectives: Anti-infectives (From admission, onward)   Start     Dose/Rate Route Frequency Ordered Stop   08/21/20 1200  erythromycin 500 mg in sodium chloride 0.9 % 100 mL IVPB  Status:  Discontinued        500 mg 100 mL/hr over 60 Minutes Intravenous Every 6 hours 08/21/20 0937 08/21/20 0939   08/21/20 1200  erythromycin 250 mg in sodium chloride 0.9 % 100 mL IVPB        250 mg 100 mL/hr over 60 Minutes Intravenous Every 6 hours 08/21/20 0939     08/12/20 0700  piperacillin-tazobactam (ZOSYN) IVPB 3.375 g        3.375 g 12.5 mL/hr over 240 Minutes Intravenous Every 8 hours 08/12/20 0605     08/09/20 1100  cefoTEtan (CEFOTAN) 2 g in sodium chloride 0.9 % 100 mL IVPB        2 g 200 mL/hr over 30 Minutes Intravenous Every 12 hours 08/09/20 0957 08/10/20 1024   08/02/20 1530  metroNIDAZOLE (FLAGYL) IVPB 500 mg  Status:  Discontinued        500 mg 100 mL/hr over 60 Minutes Intravenous  Once 08/02/20 1523 08/02/20 2010   08/02/20 1130  cefoTEtan (CEFOTAN) 2 g in sodium chloride 0.9 % 100 mL IVPB  Status:  Discontinued       Note to Pharmacy: On call to OR, send with the pt to the OR   2 g 200 mL/hr  over 30 Minutes Intravenous Every 12 hours 08/02/20 0952 08/05/20 0857      Assessment/Plan:  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

## 2020-08-21 NOTE — OR Nursing (Deleted)
Pt arrived at registration for liver biopsy. He is not scheduled for liver biopsy today. He was referred back to cancer center. Per discussion with pt he did not stop his Eliquis. He has not received clearance to stop the Eliquis from Dr Juliann Pares. He did not get a preprocedure education telephone call from the radiology nurse because he was not on the schedule. Per hospital policy the pt DOESN'T need to shave hair, (for bedbugs) we can cover the hair on arrival if bedbugs are noted on arrival.

## 2020-08-21 NOTE — Plan of Care (Addendum)
Pt Axox4. Calm and cooperative and able to voice his needs. SR-ST high 90's-low 100's on tele. HR controlled with scheduled Beta-blocker. Prn Dilaudid administered once overnight and given scheduled Tylenol. Pt states would rather take the Tylenol in liquid form. Pt requested to have NGT back to LIWS due to abdominal discomfort but no complaint of NV. After a couple of hours, no output noted. This Nurse checked for residuals and of thick clear mucous-like drainage noted. On IV Protonix, IV Reglan, IV Abx, IVF and TPN. Ileostomy site pink and intact. Pouch with green loose large output. Left abdominal JP to gravity drain with scant amount of output, flushed with 84ml of sterile NS. Abdominal dressing clean dry and intact. Pt encouraged to TCDB, and ambulate. Vitals stable. Will continue to monitor.  Problem: Education: Goal: Knowledge of General Education information will improve Description: Including pain rating scale, medication(s)/side effects and non-pharmacologic comfort measures Outcome: Progressing   Problem: Health Behavior/Discharge Planning: Goal: Ability to manage health-related needs will improve Outcome: Progressing   Problem: Clinical Measurements: Goal: Ability to maintain clinical measurements within normal limits will improve Outcome: Progressing Goal: Will remain free from infection Outcome: Progressing Goal: Diagnostic test results will improve Outcome: Progressing Goal: Respiratory complications will improve Outcome: Progressing Goal: Cardiovascular complication will be avoided Outcome: Progressing   Problem: Activity: Goal: Risk for activity intolerance will decrease Outcome: Progressing   Problem: Nutrition: Goal: Adequate nutrition will be maintained Outcome: Progressing   Problem: Coping: Goal: Level of anxiety will decrease Outcome: Progressing   Problem: Elimination: Goal: Will not experience complications related to bowel motility Outcome:  Progressing Goal: Will not experience complications related to urinary retention Outcome: Progressing   Problem: Pain Managment: Goal: General experience of comfort will improve Outcome: Progressing   Problem: Safety: Goal: Ability to remain free from injury will improve Outcome: Progressing   Problem: Skin Integrity: Goal: Risk for impaired skin integrity will decrease Outcome: Progressing   Problem: Education: Goal: Required Educational Video(s) Outcome: Progressing   Problem: Clinical Measurements: Goal: Postoperative complications will be avoided or minimized Outcome: Progressing   Problem: Skin Integrity: Goal: Demonstration of wound healing without infection will improve Outcome: Progressing

## 2020-08-22 LAB — MAGNESIUM: Magnesium: 1.7 mg/dL (ref 1.7–2.4)

## 2020-08-22 LAB — COMPREHENSIVE METABOLIC PANEL
ALT: 46 U/L — ABNORMAL HIGH (ref 0–44)
AST: 30 U/L (ref 15–41)
Albumin: 2.3 g/dL — ABNORMAL LOW (ref 3.5–5.0)
Alkaline Phosphatase: 122 U/L (ref 38–126)
Anion gap: 9 (ref 5–15)
BUN: 19 mg/dL (ref 6–20)
CO2: 26 mmol/L (ref 22–32)
Calcium: 8.9 mg/dL (ref 8.9–10.3)
Chloride: 101 mmol/L (ref 98–111)
Creatinine, Ser: 0.82 mg/dL (ref 0.61–1.24)
GFR, Estimated: >60 mL/min (ref >60)
Glucose, Bld: 100 mg/dL — ABNORMAL HIGH (ref 70–99)
Potassium: 4.2 mmol/L (ref 3.5–5.1)
Sodium: 136 mmol/L (ref 135–145)
Total Bilirubin: 0.4 mg/dL (ref 0.3–1.2)
Total Protein: 7.8 g/dL (ref 6.5–8.1)

## 2020-08-22 LAB — GLUCOSE, CAPILLARY
Glucose-Capillary: 97 mg/dL (ref 70–99)
Glucose-Capillary: 102 mg/dL — ABNORMAL HIGH (ref 70–99)
Glucose-Capillary: 87 mg/dL (ref 70–99)
Glucose-Capillary: 117 mg/dL — ABNORMAL HIGH (ref 70–99)

## 2020-08-22 LAB — PHOSPHORUS: Phosphorus: 4.1 mg/dL (ref 2.5–4.6)

## 2020-08-22 LAB — TRIGLYCERIDES: Triglycerides: 99 mg/dL (ref <150)

## 2020-08-22 MED ORDER — TRAVASOL 10 % IV SOLN
INTRAVENOUS | Status: AC
  Filled 2020-08-22: qty 1188

## 2020-08-22 MED ORDER — MAGNESIUM SULFATE 2 GM/50ML IV SOLN
2.0000 g | Freq: Once | INTRAVENOUS | Status: AC
  Administered 2020-08-22: 2 g via INTRAVENOUS
  Filled 2020-08-22: qty 50

## 2020-08-22 NOTE — Plan of Care (Addendum)
Pt Axox4. Calm and cooperative and able to voice his needs. Mostly SR on 80s-90's on tele. HR controlled with scheduled Beta-blocker. Pt stated throat hurt and refused Tylenol.  Oxycodone 5mg  Tablet per NGT administered twice overnight. NGT clamped all night. Pt tolerated well. Residuals . On IV Protonix, IV Reglan, IV Abx, IVF and TPN. Ileostomy site pink and intact. Pouch with green bilious loose large output with sediments. Left abdominal JP to gravity drain with continued scant amount of serous output, flushed once with 106ml of sterile NS. Abdominal dressing clean dry and intact. Pt encouraged to TCDB, and ambulate. Vitals stable. Will continue to monitor.  Problem: Education: Goal: Knowledge of General Education information will improve Description: Including pain rating scale, medication(s)/side effects and non-pharmacologic comfort measures Outcome: Progressing   Problem: Health Behavior/Discharge Planning: Goal: Ability to manage health-related needs will improve Outcome: Progressing   Problem: Clinical Measurements: Goal: Ability to maintain clinical measurements within normal limits will improve Outcome: Progressing Goal: Will remain free from infection Outcome: Progressing Goal: Diagnostic test results will improve Outcome: Progressing Goal: Respiratory complications will improve Outcome: Progressing Goal: Cardiovascular complication will be avoided Outcome: Progressing   Problem: Activity: Goal: Risk for activity intolerance will decrease Outcome: Progressing   Problem: Nutrition: Goal: Adequate nutrition will be maintained Outcome: Progressing   Problem: Coping: Goal: Level of anxiety will decrease Outcome: Progressing   Problem: Elimination: Goal: Will not experience complications related to bowel motility Outcome: Progressing Goal: Will not experience complications related to urinary retention Outcome: Progressing   Problem: Pain Managment: Goal: General  experience of comfort will improve Outcome: Progressing   Problem: Safety: Goal: Ability to remain free from injury will improve Outcome: Progressing   Problem: Skin Integrity: Goal: Risk for impaired skin integrity will decrease Outcome: Progressing   Problem: Education: Goal: Required Educational Video(s) Outcome: Progressing   Problem: Clinical Measurements: Goal: Postoperative complications will be avoided or minimized Outcome: Progressing   Problem: Skin Integrity: Goal: Demonstration of wound healing without infection will improve Outcome: Progressing

## 2020-08-22 NOTE — Consult Note (Signed)
PHARMACY - TOTAL PARENTERAL NUTRITION CONSULT NOTE   Indication: Small bowel obstruction  Patient Measurements: Height: 5\' 10"  (177.8 cm) Weight: 64 kg (141 lb 1.5 oz) IBW/kg (Calculated) : 73 TPN AdjBW (KG): 65.5 Body mass index is 20.24 kg/m. Usual Weight: 64 kg (current 58kg)  Assessment: Thomas Rivas is a 24 y.o. 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  Triglycerides and transaminases are back wnl  Glucose / Insulin: 99 - 119  mg/dL (0 units SSI required) -   regular insulin 10 units to TPN bag A1C 6.1% Electrolytes: electrolytes WNL, magnesium borderline low Renal: SCr  < 1, stable LFTs / TGs:          AST/ALT WNL (ALT borderline but stable)         TG 106--> 451-->89-->99 Prealbumin / albumin: albumin 3.0 > 2.1 > 2.4>2.3 Intake / Output; MIVF: MIVF: NS at 50 ml/hr  Recent GI Imaging:  KUB (08/21/2020) Evidence of ongoing small bowel obstruction Erythromycin + metoclopramide added 11/17 Surgeries / Procedures:  18 Days Post-Op s/p exploratory laparotomy with extensive lysis of adhesions 13 Days Post-Op s/p closure of fascial dehiscence with internal and external retention sutrures Central access: PICC line placed 08/01/20 TPN start date: 08/01/20  Nutritional Goals (per RD recommendation on 10/27): kCal: 2100-2400kcal/day, Protein: 110-120g/day, Fluid: 2.0-2.3L/day Goal TPN rate is 90 mL/hr (provides ~110 g of protein and ~ 2100 kcals per day)  Current Nutrition:  CLD  Plan:   continue current TPN at 90 mL/hr: (total volume 2260 mL)  Protein: 55 g/L=118.8 g/day  Dextrose 302.4 g (14%)  Lipids: 37 g/L=80.0 g/day  kCal 2303.4  Electrolytes in TPN: 95 mEq/L of Na, 40 mEq/L of K, 5 mEq/L of Ca, 5 mEq/L of Mg, and 5 mmol/L of Phos. Cl:Ac ratio 1:1   2 grams IV magnesium  sulfate x 1  Add standard MVI and trace elements to TPN  continue SSI at sensitive scale (0-9 units) q6h   remove regular insulin from TPN   MIVF:  NS at 50 ml/hr   Monitor TPN labs on Mon/Thurs: CMP, Mg, Phosphorus,TG   Re-check in am 11/19 given recent increase in lipid content on 11/17   12/17, PharmD, BCPS Clinical Pharmacist 08/22/2020 7:18 AM

## 2020-08-22 NOTE — Progress Notes (Signed)
CC: SBO Subjective: continues to improve Tolerated CLD w/o much residual from ngt Significant improvement after erythromycin was started Ileostomy w 1400cc bilious output  Objective: Vital signs in last 24 hours: Temp:  [98.5 F (36.9 C)-99.3 F (37.4 C)] 98.5 F (36.9 C) (11/18 0823) Pulse Rate:  [91-101] 98 (11/18 0823) Resp:  [18-20] 20 (11/18 0823) BP: (98-106)/(59-79) 103/62 (11/18 0823) SpO2:  [100 %] 100 % (11/18 0823) Weight:  [56.9 kg] 56.9 kg (11/18 0500) Last BM Date: 08/22/20  Intake/Output from previous day: 11/17 0701 - 11/18 0700 In: 2545.8 [P.O.:500; I.V.:1938.6; IV Piggyback:107.1] Out: 2765 [Urine:1000; Emesis/NG output:300; Drains:65; Stool:1400] Intake/Output this shift: No intake/output data recorded.  Physical exam:  NAD ELF:YBOFB portion healing by secondary intention Mediplex dressing place., lower half w skin sutures and external retention sutures, closure is holding. Ileostomy w bilious output. No infection, clear JP   Lab Results: CBC  Recent Labs    08/21/20 0454  WBC 8.0  HGB 8.9*  HCT 28.6*  PLT 927*   BMET Recent Labs    08/22/20 0630  NA 136  K 4.2  CL 101  CO2 26  GLUCOSE 100*  BUN 19  CREATININE 0.82  CALCIUM 8.9   PT/INR No results for input(s): LABPROT, INR in the last 72 hours. ABG No results for input(s): PHART, HCO3 in the last 72 hours.  Invalid input(s): PCO2, PO2  Studies/Results: DG Abd Portable 2V  Result Date: 08/21/2020 CLINICAL DATA:  24 year old male with small bowel obstruction EXAM: PORTABLE ABDOMEN - 2 VIEW COMPARISON:  CT 08/18/2020, plain 08/17/2020, 03/15/2020, barium enema 05/10/2020 FINDINGS: Similar appearance of gas filled small bowel. Retained barium contrast within the rectal pouch. Pigtail drainage catheter projects over the pelvis. Gastric tube terminates with the tip in the left upper quadrant and the side port above the GE junction. Overlying EKG leads. Ostomy apparatus in the right  lower quadrant No displaced fracture IMPRESSION: Evidence of ongoing small bowel obstruction. Electronically Signed   By: Gilmer Mor D.O.   On: 08/21/2020 08:28    Anti-infectives: Anti-infectives (From admission, onward)   Start     Dose/Rate Route Frequency Ordered Stop   08/21/20 1200  erythromycin 500 mg in sodium chloride 0.9 % 100 mL IVPB  Status:  Discontinued        500 mg 100 mL/hr over 60 Minutes Intravenous Every 6 hours 08/21/20 0937 08/21/20 0939   08/21/20 1200  erythromycin 250 mg in sodium chloride 0.9 % 100 mL IVPB        250 mg 100 mL/hr over 60 Minutes Intravenous Every 6 hours 08/21/20 0939     08/12/20 0700  piperacillin-tazobactam (ZOSYN) IVPB 3.375 g        3.375 g 12.5 mL/hr over 240 Minutes Intravenous Every 8 hours 08/12/20 0605     08/09/20 1100  cefoTEtan (CEFOTAN) 2 g in sodium chloride 0.9 % 100 mL IVPB        2 g 200 mL/hr over 30 Minutes Intravenous Every 12 hours 08/09/20 0957 08/10/20 1024   08/02/20 1530  metroNIDAZOLE (FLAGYL) IVPB 500 mg  Status:  Discontinued        500 mg 100 mL/hr over 60 Minutes Intravenous  Once 08/02/20 1523 08/02/20 2010   08/02/20 1130  cefoTEtan (CEFOTAN) 2 g in sodium chloride 0.9 % 100 mL IVPB  Status:  Discontinued       Note to Pharmacy: On call to OR, send with the pt to the OR   2  g 200 mL/hr over 30 Minutes Intravenous Every 12 hours 08/02/20 2575 08/05/20 0857      Assessment/Plan:  Resolving ileus We will keep ngt as pt does not want to risk putting it back Mobilize Keep TPN for now and IV A/bs stop date 11/22 ( 2 weeks) Magdalen Spatz of fulls   Sterling Big, MD, [facility]  08/22/2020

## 2020-08-23 ENCOUNTER — Inpatient Hospital Stay: Payer: Medicaid Other

## 2020-08-23 LAB — BASIC METABOLIC PANEL
Anion gap: 8 (ref 5–15)
BUN: 14 mg/dL (ref 6–20)
CO2: 25 mmol/L (ref 22–32)
Calcium: 8.3 mg/dL — ABNORMAL LOW (ref 8.9–10.3)
Chloride: 101 mmol/L (ref 98–111)
Creatinine, Ser: 0.71 mg/dL (ref 0.61–1.24)
GFR, Estimated: >60 mL/min (ref >60)
Glucose, Bld: 108 mg/dL — ABNORMAL HIGH (ref 70–99)
Potassium: 4.3 mmol/L (ref 3.5–5.1)
Sodium: 134 mmol/L — ABNORMAL LOW (ref 135–145)

## 2020-08-23 LAB — MAGNESIUM: Magnesium: 1.9 mg/dL (ref 1.7–2.4)

## 2020-08-23 LAB — GLUCOSE, CAPILLARY
Glucose-Capillary: 98 mg/dL (ref 70–99)
Glucose-Capillary: 87 mg/dL (ref 70–99)
Glucose-Capillary: 106 mg/dL — ABNORMAL HIGH (ref 70–99)

## 2020-08-23 LAB — TRIGLYCERIDES: Triglycerides: 98 mg/dL (ref <150)

## 2020-08-23 MED ORDER — KETOROLAC TROMETHAMINE 30 MG/ML IJ SOLN
30.0000 mg | Freq: Four times a day (QID) | INTRAMUSCULAR | Status: DC | PRN
  Administered 2020-08-25: 30 mg via INTRAVENOUS
  Filled 2020-08-23: qty 1

## 2020-08-23 MED ORDER — TRAVASOL 10 % IV SOLN
INTRAVENOUS | Status: AC
  Filled 2020-08-23: qty 1188

## 2020-08-23 MED ORDER — ALPRAZOLAM 0.5 MG PO TABS: 0.5000 mg | ORAL_TABLET | Freq: Three times a day (TID) | ORAL | Status: DC | PRN

## 2020-08-23 NOTE — Progress Notes (Signed)
Pt prefers that his morning dressing be changed with Dr. [name]. Will notify oncoming nurse.

## 2020-08-23 NOTE — Progress Notes (Signed)
Referring Physician(s): Yves Dill PA  Supervising Physician: Dr. Andrey Campanile  Patient Status:  [facility] - In-pt  Chief Complaint: SBO s/p lap with lysis of adhesion on 10.29.21 complicated by LLQ intra abdominal abscess s/p abscess drain placement on 11.8.21 by Dr. Deanne Coffer  Subjective: Patient alert and laying in bed, calm and comfortable. Denies abdominal pain, nausea, vomiting or bleeding. Patient expressing frustration about his hospital course.  He is eager to be discharged but states he does not want to be discharged with the drain in place.    Allergies: Lactose intolerance (gi)  Medications: Prior to Admission medications   Medication Sig Start Date End Date Taking? Authorizing Provider  Ensure (ENSURE) Take 237 mLs by mouth.    [provider]     Vital Signs: BP 103/68 (BP Location: Left Arm)   Pulse (!) 101   Temp 98.5 F (36.9 C) (Oral)   Resp 16   Ht 5\' 10"  (1.778 m)   Wt 123 lb 10.9 oz (56.1 kg)   SpO2 100%   BMI 17.75 kg/m   Physical Exam Vitals and nursing note reviewed.  Constitutional:      Appearance: He is well-developed.  HENT:     Head: Normocephalic.  Pulmonary:     Effort: Pulmonary effort is normal.  Abdominal:     Comments: Positive left sided drain  to suction. site is unremarkable with no erythema, edema, tenderness, bleeding or drainage noted at exit site. Suture in place. Dressing is clean dry and intact. 3 ml of  serosanginous colored fluid noted in bulb suction device. Drain is able to be flushed easily. Due to turbid consistency of OP drain irrigated at bedside with brisk output.     Musculoskeletal:        General: Normal range of motion.     Cervical back: Normal range of motion.  Skin:    General: Skin is dry.  Neurological:     Mental Status: He is alert and oriented to person, place, and time.     Imaging: DG Abd Portable 2V  Result Date: 08/21/2020 CLINICAL DATA:  24 year old male with small bowel obstruction  EXAM: PORTABLE ABDOMEN - 2 VIEW COMPARISON:  CT 08/18/2020, plain 08/17/2020, 03/15/2020, barium enema 05/10/2020 FINDINGS: Similar appearance of gas filled small bowel. Retained barium contrast within the rectal pouch. Pigtail drainage catheter projects over the pelvis. Gastric tube terminates with the tip in the left upper quadrant and the side port above the GE junction. Overlying EKG leads. Ostomy apparatus in the right lower quadrant No displaced fracture IMPRESSION: Evidence of ongoing small bowel obstruction. Electronically Signed   By: 07/10/2020 D.O.   On: 08/21/2020 08:28    Labs:  CBC: Recent Labs    08/14/20 0434 08/17/20 0437 08/19/20 0552 08/21/20 0454  WBC 9.7 11.5* 10.0 8.0  HGB 8.0* 8.3* 8.5* 8.9*  HCT 25.0* 26.4* 26.5* 28.6*  PLT 1,129* 1,175* 1,055* 927*    COAGS: Recent Labs    07/28/20 2106  INR 1.1    BMP: Recent Labs    08/17/20 0437 08/19/20 0552 08/22/20 0630 08/23/20 0505  NA 137 134* 136 134*  K 4.3 4.2 4.2 4.3  CL 99 98 101 101  CO2 25 27 26 25   GLUCOSE 96 118* 100* 108*  BUN 20 19 19 14   CALCIUM 8.7* 9.0 8.9 8.3*  CREATININE 0.76 0.86 0.82 0.71  GFRNONAA >60 >60 >60 >60    LIVER FUNCTION TESTS: Recent Labs  08/15/20 0627 08/17/20 0437 08/19/20 0552 08/22/20 0630  BILITOT 0.9 0.9 0.6 0.4  AST 23 34 31 30  ALT 30 41 46* 46*  ALKPHOS 153* 145* 138* 122  PROT 8.0 8.1 8.7* 7.8  ALBUMIN 2.3* 2.3* 2.4* 2.3*    Assessment and Plan:  24 y.o. 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 Admitted for SBO s/p lap with lysis of adhesion on 10.29.21 and facial dehiscence repair on 11.5.21. Due to ongoing leukocytosis a CT abdomen pelvis was performed on 11.8.21 that revealed a LLQ abscess. IR placed a  12 Fr abscess drain on 11.8.21. Cultures showed multiple organism with no anaerobes isolated. Patient is afebrile. No leukocytosis. Per EPIC output  is 30, 65 ml, 20 ml, 20 ml.  CT abd pelvis from 11.14 shows drain in good position with the abscess decreasing in size. Due to scant output the abscess drain was aggressively flushed on 1115.21 and again 11.17.21 with brisk return of output.  3 ml of  Turbid serosanginous colored fluid noted in bulb suction device. Drain is able to be flushed easily. Due to turbid consistency of OP drain irrigated at bedside with brisk return of output.   Recommend team continue with flushing TID, output recording q shift and dressing changes as needed. Would consider additional imaging when output is less than 10 ml for 24 hours not including flush material.   Continue current treatment plans as per surgery.   Electronically Signed: Alene Mires, NP 08/23/2020, 9:08 AM   I spent a total of 15 Minutes at the patient's bedside AND on the patient's hospital floor or unit, greater than 50% of which was counseling/coordinating care for LLQ abscess drain

## 2020-08-23 NOTE — Consult Note (Addendum)
PHARMACY - TOTAL PARENTERAL NUTRITION CONSULT NOTE   Indication: Small bowel obstruction  Patient Measurements: Height: 5\' 10"  (177.8 cm) Weight: 64 kg (141 lb 1.5 oz) IBW/kg (Calculated) : 73 TPN AdjBW (KG): 65.5 Body mass index is 20.24 kg/m. Usual Weight: 64 kg (current 58kg)  Assessment: Thomas Rivas is Rivas 24 y.o. 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  Triglycerides and transaminases are back wnl  Glucose / Insulin: 87-117  mg/dL (0 units SSI required)  A1C 6.1% Electrolytes: electrolytes WNL, Na 134 slightly low Renal: SCr  < 1, stable LFTs / TGs:          AST/ALT WNL (ALT borderline but stable)         TG 106--> 451-->89-->99>98 Prealbumin / albumin: albumin 3.0 > 2.1 > 2.4>2.3 Intake / Output; MIVF: MIVF: NS at 50 ml/hr  Recent GI Imaging:  KUB (08/21/2020) Evidence of ongoing small bowel obstruction Erythromycin + metoclopramide added 11/17 Surgeries / Procedures:  18 Days Post-Op s/p exploratory laparotomy with extensive lysis of adhesions 13 Days Post-Op s/p closure of fascial dehiscence with internal and external retention sutrures Central access: PICC line placed 08/01/20 TPN start date: 08/01/20  Nutritional Goals (per RD recommendation on 10/27): kCal: 2100-2400kcal/day, Protein: 110-120g/day, Fluid: 2.0-2.3L/day Goal TPN rate is 90 mL/hr (provides ~110 g of protein and ~ 2100 kcals per day)  Current Nutrition:  Full liquid. NG tube removed 11/19  Plan:   continue current TPN at 90 mL/hr: (total volume 2260 mL)  Protein: 55 g/L=118.8 g/day  Dextrose 302.4 g (14%)  Lipids: 37 g/L=80.0 g/day  kCal 2303.4  Electrolytes in TPN: 95 mEq/L of Na, 40 mEq/L of K, 5 mEq/L of Ca, 5 mEq/L of Mg, and 5 mmol/L of Phos. Cl:Ac ratio 1:1   Add standard MVI and trace elements to  TPN  continue SSI at sensitive scale (0-9 units) q6h   remove regular insulin from TPN on 11/18  MIVF:  NS at 50 ml/hr   Monitor TPN labs on Mon/Thurs: CMP, Mg, Phosphorus,TG    Thomas Rivas, PharmD, BCPS Clinical Pharmacist 08/23/2020 10:04 AM

## 2020-08-23 NOTE — Care Plan (Signed)
Tele is off and Thomas Rivas called and made aware of order d/c. NG tube removed, pt tolerated well.

## 2020-08-23 NOTE — Progress Notes (Signed)
CC: Ileus following laparotomy  Subjective:  continues to improve Tolerated CLD w/o much residual from ngt Significant improvement after erythromycin was started Ileostomy w 530cc bilious output KUN persistent ileus Labs ok Good UO  Objective: Vital signs in last 24 hours: Temp:  [98.1 F (36.7 C)-98.9 F (37.2 C)] 98.4 F (36.9 C) (11/19 1130) Pulse Rate:  [80-101] 90 (11/19 1130) Resp:  [16] 16 (11/19 1130) BP: (96-105)/(61-74) 99/63 (11/19 1130) SpO2:  [100 %] 100 % (11/19 1130) Weight:  [56.1 kg] 56.1 kg (11/18 1400) Last BM Date: 08/23/20  Intake/Output from previous day: 11/18 0701 - 11/19 0700 In: 3260.8 [I.V.:2643.9; IV Piggyback:611.9] Out: 2085 [Urine:1525; Drains:30; Stool:530] Intake/Output this shift: Total I/O In: 0  Out: 770 [Urine:760; Drains:10]  Physical exam: NAD TGG:YIRSW portion healing by secondary intentionMediplex dressing place., lower half w skin sutures and external retention sutures, closure is holding. Ileostomy w bilious output. No infection, clear JP Lab Results: CBC  Recent Labs    08/21/20 0454  WBC 8.0  HGB 8.9*  HCT 28.6*  PLT 927*   BMET Recent Labs    08/22/20 0630 08/23/20 0505  NA 136 134*  K 4.2 4.3  CL 101 101  CO2 26 25  GLUCOSE 100* 108*  BUN 19 14  CREATININE 0.82 0.71  CALCIUM 8.9 8.3*   PT/INR No results for input(s): LABPROT, INR in the last 72 hours. ABG No results for input(s): PHART, HCO3 in the last 72 hours.  Invalid input(s): PCO2, PO2  Studies/Results: No results found.  Anti-infectives: Anti-infectives (From admission, onward)   Start     Dose/Rate Route Frequency Ordered Stop   08/21/20 1200  erythromycin 500 mg in sodium chloride 0.9 % 100 mL IVPB  Status:  Discontinued        500 mg 100 mL/hr over 60 Minutes Intravenous Every 6 hours 08/21/20 0937 08/21/20 0939   08/21/20 1200  erythromycin 250 mg in sodium chloride 0.9 % 100 mL IVPB        250 mg 100 mL/hr over 60 Minutes  Intravenous Every 6 hours 08/21/20 0939     08/12/20 0700  piperacillin-tazobactam (ZOSYN) IVPB 3.375 g        3.375 g 12.5 mL/hr over 240 Minutes Intravenous Every 8 hours 08/12/20 0605 08/26/20 0559   08/09/20 1100  cefoTEtan (CEFOTAN) 2 g in sodium chloride 0.9 % 100 mL IVPB        2 g 200 mL/hr over 30 Minutes Intravenous Every 12 hours 08/09/20 0957 08/10/20 1024   08/02/20 1530  metroNIDAZOLE (FLAGYL) IVPB 500 mg  Status:  Discontinued        500 mg 100 mL/hr over 60 Minutes Intravenous  Once 08/02/20 1523 08/02/20 2010   08/02/20 1130  cefoTEtan (CEFOTAN) 2 g in sodium chloride 0.9 % 100 mL IVPB  Status:  Discontinued       Note to Pharmacy: On call to OR, send with the pt to the OR   2 g 200 mL/hr over 30 Minutes Intravenous Every 12 hours 08/02/20 0952 08/05/20 0857      Assessment/Plan:  NGT removed and tolerated well Fulls for now Still needs TPN due to poor PO intake A/bs to finish 11/22 Daily dressing changes, now ok to be done BY RN Mobilize Keep arythromycin  Sterling Big, MD, FACS  08/23/2020

## 2020-08-24 LAB — GLUCOSE, CAPILLARY
Glucose-Capillary: 100 mg/dL — ABNORMAL HIGH (ref 70–99)
Glucose-Capillary: 105 mg/dL — ABNORMAL HIGH (ref 70–99)
Glucose-Capillary: 107 mg/dL — ABNORMAL HIGH (ref 70–99)
Glucose-Capillary: 120 mg/dL — ABNORMAL HIGH (ref 70–99)
Glucose-Capillary: 62 mg/dL — ABNORMAL LOW (ref 70–99)

## 2020-08-24 MED ORDER — TRAVASOL 10 % IV SOLN
INTRAVENOUS | Status: AC
  Filled 2020-08-24: qty 1188

## 2020-08-24 NOTE — Consult Note (Signed)
PHARMACY - TOTAL PARENTERAL NUTRITION CONSULT NOTE   Indication: Small bowel obstruction  Patient Measurements: Height: 5\' 10"  (177.8 cm) Weight: 64 kg (141 lb 1.5 oz) IBW/kg (Calculated) : 73 TPN AdjBW (KG): 65.5 Body mass index is 20.24 kg/m. Usual Weight: 64 kg (current 58kg)  Assessment: Mr Ferrall is a 24 y.o. 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  Triglycerides and transaminases are back wnl  Glucose / Insulin: 87-117  mg/dL (0 units SSI required)  A1C 6.1% Electrolytes: electrolytes WNL, Na 134 slightly low Renal: SCr  < 1, stable LFTs / TGs:          AST/ALT WNL (ALT borderline but stable)         TG 106--> 451-->89-->99>98 Prealbumin / albumin: albumin 3.0 > 2.1 > 2.4>2.3 Intake / Output; MIVF: MIVF: NS at 50 ml/hr  Recent GI Imaging:  KUB (08/21/2020) Evidence of ongoing small bowel obstruction Erythromycin + metoclopramide added 11/17 Surgeries / Procedures:  18 Days Post-Op s/p exploratory laparotomy with extensive lysis of adhesions 13 Days Post-Op s/p closure of fascial dehiscence with internal and external retention sutrures Central access: PICC line placed 08/01/20 TPN start date: 08/01/20  Nutritional Goals (per RD recommendation on 10/27): kCal: 2100-2400kcal/day, Protein: 110-120g/day, Fluid: 2.0-2.3L/day Goal TPN rate is 90 mL/hr (provides ~110 g of protein and ~ 2100 kcals per day)  Current Nutrition:  Full liquid. NG tube removed 11/19  Plan:   continue current TPN at 90 mL/hr: (total volume 2260 mL)  Protein: 55 g/L=118.8 g/day  Dextrose 302.4 g (14%)  Lipids: 37 g/L=80.0 g/day  kCal 2303.4  Electrolytes in TPN: 95 mEq/L of Na, 40 mEq/L of K, 5 mEq/L of Ca, 5 mEq/L of Mg, and 5 mmol/L of Phos. Cl:Ac ratio 1:1   Add standard MVI and trace elements to  TPN  continue SSI at sensitive scale (0-9 units) q6h   remove regular insulin from TPN on 11/18  MIVF:  NS at 50 ml/hr - d/c fluid as, fluid goal is 2.3 L/day.   Monitor TPN labs on Mon/Thurs: CMP, Mg, Phosphorus,TG    12/18, PharmD, BCPS Clinical Pharmacist 08/24/2020 10:28 AM

## 2020-08-24 NOTE — Progress Notes (Signed)
CC: Ileus following laparotomy  Subjective:  continues to improve Tolerated FLD and is requesting to move onto real food.  Ileostomy w bilious output Good UO  Objective: Vital signs in last 24 hours: Temp:  [98.2 F (36.8 C)-99.2 F (37.3 C)] 98.2 F (36.8 C) (11/20 0716) Pulse Rate:  [73-90] 82 (11/20 0716) Resp:  [16-20] 20 (11/20 0716) BP: (97-102)/(58-68) 100/68 (11/20 0716) SpO2:  [100 %] 100 % (11/20 0716) Weight:  [55.4 kg-56.1 kg] 55.4 kg (11/20 0608) Last BM Date: 08/24/20  Intake/Output from previous day: 11/19 0701 - 11/20 0700 In: 2826.3 [P.O.:120; I.V.:2207.8; IV Piggyback:493.6] Out: 2325 [Urine:2160; Drains:15; Stool:150] Intake/Output this shift: Total I/O In: 0  Out: 5 [Drains:5]  Serous.   Physical exam: NAD RFX:JOITG portion healing by secondary intentionMepilex dressing place., lower half w skin sutures and external retention sutures, closure is holding. Ileostomy w bilious output. No infection, clear serous JP output Lab Results: CBC  No results for input(s): WBC, HGB, HCT, PLT in the last 72 hours. BMET Recent Labs    08/22/20 0630 08/23/20 0505  NA 136 134*  K 4.2 4.3  CL 101 101  CO2 26 25  GLUCOSE 100* 108*  BUN 19 14  CREATININE 0.82 0.71  CALCIUM 8.9 8.3*   PT/INR No results for input(s): LABPROT, INR in the last 72 hours. ABG No results for input(s): PHART, HCO3 in the last 72 hours.  Invalid input(s): PCO2, PO2  Studies/Results: DG Abd Portable 2V  Result Date: 08/23/2020 CLINICAL DATA:  Small-bowel obstruction EXAM: PORTABLE ABDOMEN - 2 VIEW COMPARISON:  Abdomen 08/21/2020 FINDINGS: Lung bases are clear. Gastric tube terminates in the upper abdomen, tip in the proximal stomach. Side port likely at the GE junction. Position is similar to the prior exam Persistent dilation of predominantly small bowel loops in the central abdomen. Dense contrast material in the pelvis as on the prior study likely in the rectum with pigtail  catheter projecting over the pelvis. Ostiomeatal appliance in the RIGHT lower quadrant as on the previous exam. Limited assessment skeletal structures is unremarkable. IMPRESSION: Persistent small bowel dilation, nasogastric tube in the proximal stomach with side port in the distal esophagus. Ostomy appliance in the RIGHT lower quadrant and pigtail drainage catheter over the LEFT flank in central pelvis as on the previous exam. These results will be called to the ordering clinician or representative by the Radiologist Assistant, and communication documented in the PACS or [employer]. Electronically Signed   By: Donzetta Kohut M.D.   On: 08/23/2020 08:07    Anti-infectives: Anti-infectives (From admission, onward)   Start     Dose/Rate Route Frequency Ordered Stop   08/21/20 1200  erythromycin 500 mg in sodium chloride 0.9 % 100 mL IVPB  Status:  Discontinued        500 mg 100 mL/hr over 60 Minutes Intravenous Every 6 hours 08/21/20 0937 08/21/20 0939   08/21/20 1200  erythromycin 250 mg in sodium chloride 0.9 % 100 mL IVPB        250 mg 100 mL/hr over 60 Minutes Intravenous Every 6 hours 08/21/20 0939     08/12/20 0700  piperacillin-tazobactam (ZOSYN) IVPB 3.375 g        3.375 g 12.5 mL/hr over 240 Minutes Intravenous Every 8 hours 08/12/20 0605 08/26/20 0559   08/09/20 1100  cefoTEtan (CEFOTAN) 2 g in sodium chloride 0.9 % 100 mL IVPB        2 g 200 mL/hr over 30 Minutes Intravenous Every  12 hours 08/09/20 0957 08/10/20 1024   08/02/20 1530  metroNIDAZOLE (FLAGYL) IVPB 500 mg  Status:  Discontinued        500 mg 100 mL/hr over 60 Minutes Intravenous  Once 08/02/20 1523 08/02/20 2010   08/02/20 1130  cefoTEtan (CEFOTAN) 2 g in sodium chloride 0.9 % 100 mL IVPB  Status:  Discontinued       Note to Pharmacy: On call to OR, send with the pt to the OR   2 g 200 mL/hr over 30 Minutes Intravenous Every 12 hours 08/02/20 0952 08/05/20 0857      Assessment/Plan:  Will offer somethings to  encourage po as available in regular diet. Still needs TPN due to poor PO intake A/bs to finish 11/22 Daily dressing changes, to be done BY RN Mobilize Keep arythromycin  Campbell Lerner, M.D., [facility]  08/24/2020 ; 12:59 PM

## 2020-08-24 NOTE — Treatment Plan (Signed)
Pt FSBS 62. Pt received dinner tray at this time, recheck 100, pt ate 25% of tray 120cc oral intake for liquid. Pt not in distress at this time. Asymptomatic.

## 2020-08-25 LAB — GLUCOSE, CAPILLARY
Glucose-Capillary: 102 mg/dL — ABNORMAL HIGH (ref 70–99)
Glucose-Capillary: 105 mg/dL — ABNORMAL HIGH (ref 70–99)
Glucose-Capillary: 105 mg/dL — ABNORMAL HIGH (ref 70–99)
Glucose-Capillary: 106 mg/dL — ABNORMAL HIGH (ref 70–99)
Glucose-Capillary: 96 mg/dL (ref 70–99)

## 2020-08-25 MED ORDER — TRAVASOL 10 % IV SOLN
INTRAVENOUS | Status: DC
  Filled 2020-08-25: qty 1188

## 2020-08-25 NOTE — Consult Note (Signed)
PHARMACY - TOTAL PARENTERAL NUTRITION CONSULT NOTE   Indication: Small bowel obstruction  Patient Measurements: Height: 5\' 10"  (177.8 cm) Weight: 64 kg (141 lb 1.5 oz) IBW/kg (Calculated) : 73 TPN AdjBW (KG): 65.5 Body mass index is 20.24 kg/m. Usual Weight: 64 kg (current 58kg)  Assessment: Mr Medlen is a 24 y.o. 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  Triglycerides and transaminases are back wnl  Glucose / Insulin: 100-105  mg/dL (0 units SSI required)  A1C 6.1% Electrolytes: electrolytes WNL, Na 134 slightly low Renal: SCr  < 1, stable LFTs / TGs:          AST/ALT WNL (ALT borderline but stable)         TG 106--> 451-->89-->99>98 Prealbumin / albumin: albumin 3.0 > 2.1 > 2.4>2.3 Intake / Output; MIVF: MIVF: NS at 50 ml/hr  Recent GI Imaging:  KUB (08/21/2020) Evidence of ongoing small bowel obstruction Erythromycin + metoclopramide added 11/17 Surgeries / Procedures:  18 Days Post-Op s/p exploratory laparotomy with extensive lysis of adhesions 13 Days Post-Op s/p closure of fascial dehiscence with internal and external retention sutrures Central access: PICC line placed 08/01/20 TPN start date: 08/01/20  Nutritional Goals (per RD recommendation on 10/27): kCal: 2100-2400kcal/day, Protein: 110-120g/day, Fluid: 2.0-2.3L/day Goal TPN rate is 90 mL/hr (provides ~110 g of protein and ~ 2100 kcals per day)  Current Nutrition:  Full liquid. NG tube removed 11/19  Plan:   continue current TPN at 90 mL/hr: (total volume 2260 mL)  Protein: 55 g/L=118.8 g/day  Dextrose 302.4 g (14%)  Lipids: 37 g/L=80.0 g/day  kCal 2303.4  Electrolytes in TPN: 95 mEq/L of Na, 40 mEq/L of K, 5 mEq/L of Ca, 5 mEq/L of Mg, and 5 mmol/L of Phos. Cl:Ac ratio 1:1   Add standard MVI and trace elements to  TPN  continue SSI at sensitive scale (0-9 units) q6h   remove regular insulin from TPN on 11/18  MIVF:  NS at 50 ml/hr - d/c fluid as, fluid goal is 2.3 L/day.   Monitor TPN labs on Mon/Thurs: CMP, Mg, Phosphorus,TG    12/18, PharmD, BCPS Clinical Pharmacist 08/25/2020 10:23 AM

## 2020-08-25 NOTE — Progress Notes (Signed)
CC: Ileus following laparotomy  Subjective:  continues to improve Tolerated regular diet, so I discontinued his IV narcotic.  Ileostomy w bilious output Good UO  Objective: Vital signs in last 24 hours: Temp:  [98.1 F (36.7 C)-98.7 F (37.1 C)] 98.1 F (36.7 C) (11/21 1136) Pulse Rate:  [72-85] 78 (11/21 1136) Resp:  [16-20] 20 (11/21 1136) BP: (94-107)/(58-69) 107/69 (11/21 1136) SpO2:  [100 %] 100 % (11/21 1136) Weight:  [56.1 kg] 56.1 kg (11/21 0500) Last BM Date: 08/24/20  Intake/Output from previous day: 11/20 0701 - 11/21 0700 In: 1612 [P.O.:360; I.V.:1042; IV Piggyback:200] Out: 1215 [Urine:600; Drains:15; Stool:600] Intake/Output this shift: No intake/output data recorded.  Serous.   Physical exam: NAD XTK:WIOXB portion healing by secondary intentionMepilex dressing place., lower half w skin sutures and external retention sutures, closure is holding. Ileostomy w bilious output. No infection, clear serous JP output Lab Results: CBC  No results for input(s): WBC, HGB, HCT, PLT in the last 72 hours. BMET Recent Labs    08/23/20 0505  NA 134*  K 4.3  CL 101  CO2 25  GLUCOSE 108*  BUN 14  CREATININE 0.71  CALCIUM 8.3*   PT/INR No results for input(s): LABPROT, INR in the last 72 hours. ABG No results for input(s): PHART, HCO3 in the last 72 hours.  Invalid input(s): PCO2, PO2  Studies/Results: No results found.  Anti-infectives: Anti-infectives (From admission, onward)   Start     Dose/Rate Route Frequency Ordered Stop   08/21/20 1200  erythromycin 500 mg in sodium chloride 0.9 % 100 mL IVPB  Status:  Discontinued        500 mg 100 mL/hr over 60 Minutes Intravenous Every 6 hours 08/21/20 0937 08/21/20 0939   08/21/20 1200  erythromycin 250 mg in sodium chloride 0.9 % 100 mL IVPB        250 mg 100 mL/hr over 60 Minutes Intravenous Every 6 hours 08/21/20 0939     08/12/20 0700  piperacillin-tazobactam (ZOSYN) IVPB 3.375 g        3.375 g 12.5  mL/hr over 240 Minutes Intravenous Every 8 hours 08/12/20 0605 08/26/20 0559   08/09/20 1100  cefoTEtan (CEFOTAN) 2 g in sodium chloride 0.9 % 100 mL IVPB        2 g 200 mL/hr over 30 Minutes Intravenous Every 12 hours 08/09/20 0957 08/10/20 1024   08/02/20 1530  metroNIDAZOLE (FLAGYL) IVPB 500 mg  Status:  Discontinued        500 mg 100 mL/hr over 60 Minutes Intravenous  Once 08/02/20 1523 08/02/20 2010   08/02/20 1130  cefoTEtan (CEFOTAN) 2 g in sodium chloride 0.9 % 100 mL IVPB  Status:  Discontinued       Note to Pharmacy: On call to OR, send with the pt to the OR   2 g 200 mL/hr over 30 Minutes Intravenous Every 12 hours 08/02/20 0952 08/05/20 0857      Assessment/Plan:  TPN to run on to tomorrow, would consider discontinuing then. A/bs to finish 11/22 Daily dressing changes, to be done BY RN Mobilize Keep arythromycin Discharge planning.  Campbell Lerner, M.D., [facility]  08/25/2020 ; 2:45 PM

## 2020-08-26 ENCOUNTER — Observation Stay (HOSPITAL_COMMUNITY)
Admission: EM | Admit: 2020-08-26 | Discharge: 2020-08-27 | DRG: 388 | Disposition: A | Discharge status: Other Acute Inpatient Hospital | Payer: Medicaid Other | Attending: Surgery | Admitting: Surgery

## 2020-08-26 ENCOUNTER — Other Ambulatory Visit: Payer: Self-pay

## 2020-08-26 ENCOUNTER — Encounter (HOSPITAL_COMMUNITY): Payer: Self-pay | Admitting: Emergency Medicine

## 2020-08-26 DIAGNOSIS — K56609 Unspecified intestinal obstruction, unspecified as to partial versus complete obstruction: Secondary | ICD-10-CM | POA: Diagnosis not present

## 2020-08-26 DIAGNOSIS — Z20822 Contact with and (suspected) exposure to covid-19: Secondary | ICD-10-CM | POA: Diagnosis present

## 2020-08-26 DIAGNOSIS — F17210 Nicotine dependence, cigarettes, uncomplicated: Secondary | ICD-10-CM | POA: Diagnosis present

## 2020-08-26 DIAGNOSIS — L02211 Cutaneous abscess of abdominal wall: Secondary | ICD-10-CM

## 2020-08-26 DIAGNOSIS — R066 Hiccough: Secondary | ICD-10-CM | POA: Diagnosis present

## 2020-08-26 DIAGNOSIS — E739 Lactose intolerance, unspecified: Secondary | ICD-10-CM | POA: Diagnosis present

## 2020-08-26 DIAGNOSIS — Z978 Presence of other specified devices: Secondary | ICD-10-CM

## 2020-08-26 DIAGNOSIS — K651 Peritoneal abscess: Secondary | ICD-10-CM | POA: Diagnosis present

## 2020-08-26 LAB — COMPREHENSIVE METABOLIC PANEL
ALT: 29 U/L (ref 0–44)
AST: 19 U/L (ref 15–41)
Albumin: 2.4 g/dL — ABNORMAL LOW (ref 3.5–5.0)
Alkaline Phosphatase: 97 U/L (ref 38–126)
Anion gap: 7 (ref 5–15)
BUN: 17 mg/dL (ref 6–20)
CO2: 26 mmol/L (ref 22–32)
Calcium: 8.7 mg/dL — ABNORMAL LOW (ref 8.9–10.3)
Chloride: 102 mmol/L (ref 98–111)
Creatinine, Ser: 0.8 mg/dL (ref 0.61–1.24)
GFR, Estimated: >60 mL/min (ref >60)
Glucose, Bld: 117 mg/dL — ABNORMAL HIGH (ref 70–99)
Potassium: 4 mmol/L (ref 3.5–5.1)
Sodium: 135 mmol/L (ref 135–145)
Total Bilirubin: 0.4 mg/dL (ref 0.3–1.2)
Total Protein: 7.6 g/dL (ref 6.5–8.1)

## 2020-08-26 LAB — CBC
HCT: 22.4 % — ABNORMAL LOW (ref 39.0–52.0)
Hemoglobin: 7.3 g/dL — ABNORMAL LOW (ref 13.0–17.0)
MCH: 25.1 pg — ABNORMAL LOW (ref 26.0–34.0)
MCHC: 32.6 g/dL (ref 30.0–36.0)
MCV: 77 fL — ABNORMAL LOW (ref 80.0–100.0)
Platelets: 406 10*3/uL — ABNORMAL HIGH (ref 150–400)
RBC: 2.91 MIL/uL — ABNORMAL LOW (ref 4.22–5.81)
RDW: 19.5 % — ABNORMAL HIGH (ref 11.5–15.5)
WBC: 5.2 10*3/uL (ref 4.0–10.5)
nRBC: 0.4 % — ABNORMAL HIGH (ref 0.0–0.2)

## 2020-08-26 LAB — TRIGLYCERIDES: Triglycerides: 955 mg/dL — ABNORMAL HIGH (ref <150)

## 2020-08-26 LAB — GLUCOSE, CAPILLARY
Glucose-Capillary: 114 mg/dL — ABNORMAL HIGH (ref 70–99)
Glucose-Capillary: 92 mg/dL (ref 70–99)
Glucose-Capillary: 83 mg/dL (ref 70–99)

## 2020-08-26 LAB — MAGNESIUM: Magnesium: 1.8 mg/dL (ref 1.7–2.4)

## 2020-08-26 LAB — PREALBUMIN: Prealbumin: 21.1 mg/dL (ref 18–38)

## 2020-08-26 LAB — PHOSPHORUS: Phosphorus: 4.3 mg/dL (ref 2.5–4.6)

## 2020-08-26 MED ORDER — ACETAMINOPHEN 500 MG PO TABS
1000.0000 mg | ORAL_TABLET | Freq: Four times a day (QID) | ORAL | 0 refills | Status: DC
Start: 2020-08-26 — End: 2020-10-01

## 2020-08-26 MED ORDER — IBUPROFEN 800 MG PO TABS: 800.0000 mg | ORAL_TABLET | Freq: Three times a day (TID) | ORAL | 0 refills | Status: DC | PRN

## 2020-08-26 MED ORDER — ALUM & MAG HYDROXIDE-SIMETH 200-200-20 MG/5ML PO SUSP
30.0000 mL | Freq: Once | ORAL | Status: AC
  Administered 2020-08-27: 30 mL via ORAL
  Filled 2020-08-26: qty 30

## 2020-08-26 MED ORDER — TRAVASOL 10 % IV SOLN
INTRAVENOUS | Status: DC
  Filled 2020-08-26: qty 594

## 2020-08-26 MED ORDER — OXYCODONE HCL 5 MG PO TABS
5.0000 mg | ORAL_TABLET | ORAL | 0 refills | Status: DC | PRN
Start: 2020-08-26 — End: 2020-09-09

## 2020-08-26 MED ORDER — PROPRANOLOL HCL 10 MG PO TABS
10.0000 mg | ORAL_TABLET | Freq: Three times a day (TID) | ORAL | Status: DC
  Filled 2020-08-26 (×3): qty 1

## 2020-08-26 MED ORDER — METOCLOPRAMIDE HCL 5 MG PO TABS: 5.0000 mg | ORAL_TABLET | Freq: Four times a day (QID) | ORAL | 0 refills | Status: DC | PRN

## 2020-08-26 MED ORDER — SODIUM CHLORIDE 0.9 % IV SOLN
1000.0000 mL | INTRAVENOUS | Status: DC
  Administered 2020-08-27: 1000 mL via INTRAVENOUS

## 2020-08-26 MED ORDER — LIDOCAINE VISCOUS HCL 2 % MT SOLN
15.0000 mL | Freq: Once | OROMUCOSAL | Status: AC
  Administered 2020-08-27: 15 mL via ORAL
  Filled 2020-08-26: qty 15

## 2020-08-26 MED ORDER — ENSURE ENLIVE PO LIQD: 237.0000 mL | Freq: Three times a day (TID) | ORAL | Status: DC

## 2020-08-26 MED ORDER — SODIUM CHLORIDE 0.9 % IV BOLUS (SEPSIS)
1000.0000 mL | Freq: Once | INTRAVENOUS | Status: AC
  Administered 2020-08-27: 1000 mL via INTRAVENOUS

## 2020-08-26 MED ORDER — ONDANSETRON HCL 4 MG/2ML IJ SOLN
4.0000 mg | Freq: Once | INTRAMUSCULAR | Status: AC
  Administered 2020-08-27: 4 mg via INTRAVENOUS
  Filled 2020-08-26: qty 2

## 2020-08-26 NOTE — TOC Transition Note (Signed)
Transition of Care [facility]) - CM/SW Discharge Note   Patient Details  Name: Thomas Rivas MRN: [id number] Date of Birth: 1995-12-14  Transition of Care [facility]) CM/SW Contact:  Margarito Liner, LCSW Phone Number: 08/26/2020, 1:43 PM   Clinical Narrative:  Patient has orders to discharge home today. Free/low-cost healthcare packet put on front of chart for patient to take home and review. Discussed with him 11/12. No further concerns. CSW signing off.   Final next level of care: Home/Self Care Barriers to Discharge: Barriers Resolved   Patient Goals and CMS Choice        Discharge Placement                    Patient and family notified of of transfer: 08/26/20  Discharge Plan and Services                                     Social Determinants of Health (SDOH) Interventions     Readmission Risk Interventions Readmission Risk Prevention Plan 08/16/2020  Transportation Screening Complete  PCP or Specialist Appt within 3-5 Days (No Data)  Social Work Consult for Recovery Care Planning/Counseling Complete  Palliative Care Screening Not Applicable  Medication Review [employer]) Complete

## 2020-08-26 NOTE — Progress Notes (Signed)
Nutrition Follow-up  DOCUMENTATION CODES:   Severe malnutrition in context of acute illness/injury  INTERVENTION:   TPN per pharmacy- pt has already began weaning and may possibly discontinue this evening. Recommend monitor cbgs as pt is being weaned from TPN  Ensure Enlive po TID, each supplement provides 350 kcal and 20 grams of protein  NUTRITION DIAGNOSIS:   Severe Malnutrition related to acute illness as evidenced by moderate fat depletion, severe fat depletion, moderate muscle depletion, severe muscle depletion, 6 percent weight loss in 1 month.  GOAL:   Patient will meet greater than or equal to 90% of their needs - met with TPN   MONITOR:   Supplement acceptance, Labs, Weight trends, Skin, I & O's  ASSESSMENT:   24 y.o. 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 Triglycerides elevated today; RD suspects this is r/t lab error (possibly TPN was drawn along with blood sample). Blood sugars within acceptable limits. Pt advanced to a regular diet 11/20. Pt is documented to be eating <50% of meals. RD will add supplements to help pt meet his estimated needs. Recommend monitor cbgs as TPN is weaned. Plan is for discharge possibly today or tomorrow. JP drain with 39m output. Per chart, pt is down ~20lbs since admit. Pt has remained between 121-125lbs over the past week.       Medications reviewed and include: lovenox, insulin, reglan, erythromycin, pepcid  Labs reviewed: K 4.0 wnl, P 4.3 wnl, Mg 1.8 wnl Pre-albumin- 20.8- 11/15 Triglycerides- 955(H) Wbc- Hgb 7.3(L), Hct 22.4(L), MCV 77.0(L), MCH  25.1(L) cbgs- 105, 96, 102, 114 x 24 hrs  Diet Order:   Diet Order            Diet regular Room service appropriate? Yes; Fluid consistency: Thin  Diet effective now                EDUCATION NEEDS:   Education needs have been addressed  Skin:  Skin Assessment: Reviewed RN Assessment  Last BM:  11/22- 2083mvia ostomy  Height:   Ht Readings from Last 1 Encounters:  07/29/20 _0  (1.778 m)    Weight:   Wt Readings from Last 1 Encounters:  08/25/20 56.1 kg    Ideal Body Weight:  75.45 kg  BMI:  Body mass index is 17.75 kg/m.  Estimated Nutritional Needs:   Kcal:  2100-2400kcal/day  Protein:  110-120g/day  Fluid:  2.0-2.3L/day  CaKoleen DistanceS, RD, LDN Please refer to [facility] and/or RD on-call/weekend/after hours pager

## 2020-08-26 NOTE — Discharge Instructions (Signed)
  Diet: Resume home diet as tolerated.   Activity: No heavy lifting >20 pounds (children, pets, laundry, garbage) until cleared by surgeon, but light activity and walking are encouraged. Do not drive or drink alcohol if taking narcotic pain medications or having pain that might distract from driving.  Wound care: Continue superficial dressing changes daily at home. You may shower/get incision wet with soapy water and pat dry (do not rub incisions), but no baths or submerging incision underwater until follow-up.   Medications: Resume all home medications. For mild to moderate pain: acetaminophen (Tylenol) or ibuprofen/naproxen (if no kidney disease). Combining Tylenol with alcohol can substantially increase your risk of causing liver disease. Narcotic pain medications, if prescribed, can be used for severe pain, though may cause nausea, constipation, and drowsiness. Do not combine Tylenol and Percocet (or similar) within a 6 hour period as Percocet (and similar) contain(s) Tylenol. If you do not need the narcotic pain medication, you do not need to fill the prescription.  Call office [phone] / [phone]) at any time if any questions, worsening pain, fevers/chills, bleeding, drainage from incision site, or other concerns.

## 2020-08-26 NOTE — Progress Notes (Signed)
[facility] SUR[facility]ICAL PRO[facility]ESS NOTE  Hospital Day(s): 29.   Post op day(s): 17 Days Post-Op.   Interval History:  Patient seen and examined no acute events or new complaints overnight.  Patient reports he is doing well, no abdominal pain, no nausea/emesis Leukocytosis remains resolved, WBC at 5.2k Hgb trending down, now 7.3 (fropm 8.9 five days ago), likely dilutional  Thrombocytosis improving, PLT down to 406K Renal function remains normal, sCr - 0.80, UO - 900 ccs LLQ drain with 10 ccs out; serous Abx completed; continues on erythromycin for gastroparesis  TPN has been at goal rate; plan to begin weaning today (11/22) He has been tolerating regular diet Ileostomy output recorded at 200 ccs overnight  Vital signs in last 24 hours: [min-max] current  Temp:  [97.7 F (36.5 C)-98.9 F (37.2 C)] 97.7 F (36.5 C) (11/22 0401) Pulse Rate:  [72-85] 72 (11/22 0401) Resp:  [18-20] 18 (11/22 0401) BP: (92-107)/(51-69) 92/52 (11/22 0401) SpO2:  [100 %] 100 % (11/22 0401)     Height: 5\' 10"  (177.8 cm) Weight: 56.1 kg BMI (Calculated): 17.75   Intake/Output last 2 shifts:  11/21 0701 - 11/22 0700 In: 740 [P.O.:740] Out: 1110 [Urine:900; Drains:10; Stool:200]   Physical Exam:  Constitutional: alert, cooperative and no distress Respiratory: breathing non-labored at rest  Cardiovascular:NSR, no m/r/g Gastrointestinal:Soft, expected incisional soreness, no appreciable distension, no rebound/guarding, ileostomy in the RLQ with gas and stool in bag. JP drain in LLQ with serous appearing output Integumentary:Midline laparotomy healingvia secondary intention, the inferior half of this incision hs retention sutures present both internal and external, the superior portion is granulation tissue healing via secondary intention.  Labs:  CBC Latest Ref Rng & Units 08/26/2020 08/21/2020 08/19/2020  WBC 4.0 - 10.5 K/uL 5.2 8.0 10.0  Hemoglobin 13.0 - 17.0 g/dL 7.3(L) 8.9(L)  8.5(L)  Hematocrit 39 - 52 % 22.4(L) 28.6(L) 26.5(L)  Platelets 150 - 400 K/uL 406(H) 927(HH) 1,055(HH)   CMP Latest Ref Rng & Units 08/26/2020 08/23/2020 08/22/2020  Glucose 70 - 99 mg/dL 08/24/2020) 510(C) 585(I)  BUN 6 - 20 mg/dL 17 14 19   Creatinine 0.61 - 1.24 mg/dL 778(E 4.23  Sodium 135 - 145 mmol/L 135 134(L) 136  Potassium 3.5 - 5.1 mmol/L 4.0 4.3 4.2  Chloride 98 - 111 mmol/L 102 101 101  CO2 22 - 32 mmol/L 26 25 26   Calcium 8.9 - 10.3 mg/dL 5.36) 8.3(L) 8.9  Total Protein 6.5 - 8.1 g/dL 7.6 - 7.8  Total Bilirubin 0.3 - 1.2 mg/dL 0.4 - 0.4  Alkaline Phos 38 - 126 U/L 97 - 122  AST 15 - 41 U/L 19 - 30  ALT 0 - 44 U/L 29 - 46(H)     Imaging studies: No new pertinent imaging studies   Assessment/Plan:  24 y.o. male 78 Days Post-Op s/p closure of fascial dehiscence with internal and external retention sutrures 22Days Post-Ops/p exploratory laparotomy and lysis of adhesions for small bowel obstruction secondary to internal hernia, complicated by pertinent comorbidities includings/p subtotal colectomy for reportedly Hirschsprung's disease however he does nothave a formal diagnosis of this and was pending outpatient work up with GI   - We need to wean from TPN and discontinue   - Continue regular diet as tolerates   - Continue local wound care: wet to dry dressingwith vaselinedaily+ prn, rentention sutures present   - Monitor abdominal examination; on-going bowel function - Continue drain; record output   - Continue motility agents; erythromycin + Reglan  -  He has completed ABx without further fever/lkeukocytosis   - Pain control prn; antiemetics prn - Monitor leukocytosis; resolved             - Monitor thrombocytosis; improved   - Mobilize  - DVT prophylaxis    - Discharge Planning: wean from TPN today; potentially discharge this afternoon vs tomorrow   All of the above findings and recommendations were discussed with the patient, and the  medical team, and all of patient's questions were answered to his expressed satisfaction.  -- Lynden Oxford, PA-C [facility] 08/26/2020, 7:39 AM [phone] M-F: 7am - 4pm

## 2020-08-26 NOTE — ED Triage Notes (Signed)
Pt arriving with complaint of hiccups that have been present for approx. 3 hours. Pt was discharged from hospital earlier today after having a bowel obstruction. Pt now has ileostomy.

## 2020-08-26 NOTE — Discharge Summary (Signed)
[facility] SUR[facility]ICAL DISCHARGE SUMMARY  Patient ID: Thomas Rivas MRN: [id number] DOB/AGE: 06-Mar-1996 24 y.o.  Admit date: 07/28/2020 Discharge date: 08/26/2020  Discharge Diagnoses Patient Active Problem List   Diagnosis Date Noted  . Protein-calorie malnutrition, severe 08/07/2020  . Tachycardia   . Hypernatremia   . SBO (small bowel obstruction) (HCC) 07/28/2020  . Abdominal pain 07/28/2020  . Nausea & vomiting 07/28/2020  . Leukocytosis 07/28/2020  . Chronic idiopathic constipation     Consultants Medicine Gastroenterology IR  Procedures 08/02/2020:  1. Laparotomy with Extensive lysis of adhesions taking at least 90 minutes of total operative time, reduction of internal hernia   08/09/2020:  1. Closure of fascial dehiscence with both internal retention sutures and external retention sutures  HPI: Thomas Rivas is a 24 y.o. male seen in consultation at the request of [name] for small bowel obstruction.  He is well-known to me and had a significant history of a total abdominal colectomy with an end ileostomy [facility] in [place], comes in for abdominal pain that is colicky type pain that started last night.  There is no specific alleviating or aggravating factors.  He also reports nausea decreased output from the end ileostomy. One episode of  emesis.  CT scan personally reviewed showing evidence of dilated stomach and small bowel.  There is no evidence of pneumatosis there is no evidence of free air.  There is evidence of a transition point. As noted the patient had a questionable history of Hirschsprung's disease but without any definitive pathologic diagnosis and more importantly the initial presentation which I do not think that he actually has Hirschsprung's.  He has been seen by GI and currently is in the process of excluding Hirschsprung's.  BC and CMP were completely normal this morning.  He has not had an NG.  INR is  normal. Actually has improved significantly over the last 6 months with significant improvement in nutritional status.  He used to have a G-tube and that has been removed and he was tolerating regular diet up to couple of days ago  Hospital Course: Patient was admitted to the medicine service initially. Conservative management was attempted after patient became agreeable to NGT on HD2. Unfortunately, he would fail decompression and gastrografin challenge. Informed consent was obtained an he underwent exploratory laparotomy, lysis of adhesion, and reduction of internal hernia (Dr [name], 08/02/2020). Post-operatively, he did reasonably well. He maintained his NGT despite initiation of CLD. On POD6, he was found to have an area of fascial dehiscence and underwent fascial closure with internal and external retention sutures (Dr [name], 08/09/2020). Following this, he did reasonably well and diet initiated again while maintaining NGT with intermittent use of LIS. He had degree of adynamic ileus with 1-2 L of ileostomy output and NGT output daily. KUBs in follow up demonstrated multiple loops of dilated small bowel as well. There was concern for functional motility issue and motility agents (Reglan and Erythromycin) were initiated during the post-perative period. GI was also consulted for input. He did well follow initiation of motility agents. Advancement of patient's diet and ambulation were well-tolerated. Of note, he did have a CT guided percutaneous drain placed on 11/08 with IR after being found to have intra-abdominal fluid collection on CT. Cx from this did grow out multiple organisms and he completed a course of ABx (Zosyn) prior to discharge,. He was without fever, tachycardia, nor leukocytosis at time of discharge. The remainder of patient's hospital course was essentially unremarkable,  and discharge planning was initiated accordingly with patient safely able to be discharged home with appropriate discharge  instructions, pain control, and outpatient follow-up after all of his questions were answered to his expressed satisfaction.   Discharge Condition: Good      Allergies as of 08/26/2020      Reactions   Lactose Intolerance (gi)       Medication List    TAKE these medications   acetaminophen 500 MG tablet Commonly known as: TYLENOL Take 2 tablets (1,000 mg total) by mouth every 6 (six) hours.   Ensure Take 237 mLs by mouth.   ibuprofen 800 MG tablet Commonly known as: ADVIL Take 1 tablet (800 mg total) by mouth every 8 (eight) hours as needed.   metoCLOPramide 5 MG tablet Commonly known as: Reglan Take 1 tablet (5 mg total) by mouth every 6 (six) hours as needed for nausea or vomiting (bowel motility).   oxyCODONE 5 MG immediate release tablet Commonly known as: Oxy IR/ROXICODONE 1-2 tablets (5-10 mg total) by Per NG tube route every 4 (four) hours as needed for severe pain or breakthrough pain.            Discharge Care Instructions  (From admission, onward)         Start     Ordered   08/26/20 0000  Change dressing (specify)       Comments: Dressing change: 1 times per day using gauze and tape.   08/26/20 1339            Follow-up Information    Pabon, Merri Ray, MD Follow up on 09/02/2020.   Specialty: General Surgery Contact information: [address] [place] [address] [phone]                Time spent on discharge management including discussion of hospital course, clinical condition, outpatient instructions, prescriptions, and follow up with the patient and members of the medical team: >30 minutes  -- Lynden Oxford , PA-C [facility]  08/26/2020, 1:39 PM [phone] M-F: 7am - 4pm

## 2020-08-26 NOTE — Consult Note (Signed)
PHARMACY - TOTAL PARENTERAL NUTRITION CONSULT NOTE   Indication: Small bowel obstruction  Patient Measurements: Height: 5\' 10"  (177.8 cm) Weight: 64 kg (141 lb 1.5 oz) IBW/kg (Calculated) : 73 TPN AdjBW (KG): 65.5 Body mass index is 20.24 kg/m. Usual Weight: 64 kg (current 58kg)  Assessment: Thomas Rivas is a 24 y.o. 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  Triglycerides and transaminases are back wnl  Glucose / Insulin: 96 - 114  mg/dL (0 units SSI required)  A1C 6.1% Electrolytes: electrolytes WNL, Na 134 slightly low Renal: SCr  < 1, stable LFTs / TGs:          AST/ALT WNL (ALT borderline but stable)         TG 106--> 451-->89-->99>98 Prealbumin / albumin: albumin 3.0 > 2.1 > 2.4>2.3 Intake / Output; MIVF: MIVF: none 11/21 0701 - 11/22 0700 In: 740 [P.O.:740] Out: 1110 [Urine:900; Drains:10; Stool:200]  Recent GI Imaging:  KUB (08/21/2020) Evidence of ongoing small bowel obstruction Erythromycin + metoclopramide added 11/17 Surgeries / Procedures:  21 Days Post-Op s/p exploratory laparotomy with extensive lysis of adhesions 16 Days Post-Op s/p closure of fascial dehiscence with internal and external retention sutrures Central access: PICC line placed 08/01/20 TPN start date: 08/01/20  Nutritional Goals (per RD recommendation on 10/27): kCal: 2100-2400kcal/day, Protein: 110-120g/day, Fluid: 2.0-2.3L/day Goal TPN rate is 90 mL/hr (provides ~110 g of protein and ~ 2100 kcals per day)  Current Nutrition:  Regular diet  Plan:   taper current TPN to 45 mL/hr and stop when complete:  Protein: 55 g/L= 59.4 g/day  Dextrose 151 g (14%)  Lipids: 37 g/L=40.0 g/day  kCal 1151.7  Electrolytes in TPN: 95 mEq/L of Na, 40 mEq/L of K, 5 mEq/L of Ca, 5 mEq/L of Mg, and 5 mmol/L of Phos.  Cl:Ac ratio 1:1   Add standard MVI and trace elements to TPN  continue SSI at sensitive scale (0-9 units) q6h   MIVF:  none  Monitor TPN labs on Mon/Thurs: CMP, Mg, Phosphorus,TG    11/27, PharmD, BCPS Clinical Pharmacist 08/26/2020 7:03 AM

## 2020-08-27 ENCOUNTER — Inpatient Hospital Stay
Admission: AD | Admit: 2020-08-27 | Discharge: 2020-09-01 | DRG: 389 | Disposition: A | Discharge status: Home / Self Care | Payer: Medicaid Other | Source: Ambulatory Visit | Attending: Surgery | Admitting: Surgery

## 2020-08-27 ENCOUNTER — Encounter (HOSPITAL_COMMUNITY): Payer: Self-pay

## 2020-08-27 ENCOUNTER — Emergency Department (HOSPITAL_COMMUNITY): Payer: Medicaid Other

## 2020-08-27 ENCOUNTER — Inpatient Hospital Stay (HOSPITAL_COMMUNITY): Payer: Medicaid Other

## 2020-08-27 ENCOUNTER — Encounter: Payer: Self-pay | Admitting: Surgery

## 2020-08-27 DIAGNOSIS — Z20822 Contact with and (suspected) exposure to covid-19: Secondary | ICD-10-CM | POA: Diagnosis not present

## 2020-08-27 DIAGNOSIS — E739 Lactose intolerance, unspecified: Secondary | ICD-10-CM | POA: Diagnosis present

## 2020-08-27 DIAGNOSIS — F17210 Nicotine dependence, cigarettes, uncomplicated: Secondary | ICD-10-CM | POA: Diagnosis present

## 2020-08-27 DIAGNOSIS — K56609 Unspecified intestinal obstruction, unspecified as to partial versus complete obstruction: Secondary | ICD-10-CM | POA: Diagnosis present

## 2020-08-27 DIAGNOSIS — Q431 Hirschsprung's disease: Secondary | ICD-10-CM

## 2020-08-27 DIAGNOSIS — Z9049 Acquired absence of other specified parts of digestive tract: Secondary | ICD-10-CM | POA: Diagnosis not present

## 2020-08-27 DIAGNOSIS — D75839 Thrombocytosis, unspecified: Secondary | ICD-10-CM | POA: Diagnosis present

## 2020-08-27 DIAGNOSIS — R066 Hiccough: Secondary | ICD-10-CM | POA: Diagnosis not present

## 2020-08-27 DIAGNOSIS — K651 Peritoneal abscess: Secondary | ICD-10-CM | POA: Diagnosis not present

## 2020-08-27 DIAGNOSIS — Z932 Ileostomy status: Secondary | ICD-10-CM | POA: Diagnosis not present

## 2020-08-27 DIAGNOSIS — K567 Ileus, unspecified: Secondary | ICD-10-CM | POA: Diagnosis present

## 2020-08-27 LAB — CBC WITH DIFFERENTIAL/PLATELET
Abs Immature Granulocytes: 0.05 10*3/uL (ref 0.00–0.07)
Basophils Absolute: 0 10*3/uL (ref 0.0–0.1)
Basophils Relative: 0 %
Eosinophils Absolute: 0 10*3/uL (ref 0.0–0.5)
Eosinophils Relative: 0 %
HCT: 32.7 % — ABNORMAL LOW (ref 39.0–52.0)
Hemoglobin: 10.1 g/dL — ABNORMAL LOW (ref 13.0–17.0)
Immature Granulocytes: 0 %
Lymphocytes Relative: 9 %
Lymphs Abs: 1.2 10*3/uL (ref 0.7–4.0)
MCH: 22.5 pg — ABNORMAL LOW (ref 26.0–34.0)
MCHC: 30.9 g/dL (ref 30.0–36.0)
MCV: 73 fL — ABNORMAL LOW (ref 80.0–100.0)
Monocytes Absolute: 0.5 10*3/uL (ref 0.1–1.0)
Monocytes Relative: 4 %
Neutro Abs: 12.1 10*3/uL — ABNORMAL HIGH (ref 1.7–7.7)
Neutrophils Relative %: 87 %
Platelets: 602 10*3/uL — ABNORMAL HIGH (ref 150–400)
RBC: 4.48 MIL/uL (ref 4.22–5.81)
RDW: 19.2 % — ABNORMAL HIGH (ref 11.5–15.5)
WBC: 13.9 10*3/uL — ABNORMAL HIGH (ref 4.0–10.5)
nRBC: 0 % (ref 0.0–0.2)

## 2020-08-27 LAB — COMPREHENSIVE METABOLIC PANEL
ALT: 68 U/L — ABNORMAL HIGH (ref 0–44)
AST: 44 U/L — ABNORMAL HIGH (ref 15–41)
Albumin: 3.3 g/dL — ABNORMAL LOW (ref 3.5–5.0)
Alkaline Phosphatase: 124 U/L (ref 38–126)
Anion gap: 8 (ref 5–15)
BUN: 18 mg/dL (ref 6–20)
CO2: 24 mmol/L (ref 22–32)
Calcium: 9.4 mg/dL (ref 8.9–10.3)
Chloride: 101 mmol/L (ref 98–111)
Creatinine, Ser: 0.92 mg/dL (ref 0.61–1.24)
GFR, Estimated: >60 mL/min (ref >60)
Glucose, Bld: 101 mg/dL — ABNORMAL HIGH (ref 70–99)
Potassium: 4.6 mmol/L (ref 3.5–5.1)
Sodium: 133 mmol/L — ABNORMAL LOW (ref 135–145)
Total Bilirubin: 0.4 mg/dL (ref 0.3–1.2)
Total Protein: 9.8 g/dL — ABNORMAL HIGH (ref 6.5–8.1)

## 2020-08-27 LAB — RESP PANEL BY RT-PCR (FLU A&B, COVID) ARPGX2
Influenza A by PCR: NEGATIVE
Influenza B by PCR: NEGATIVE
SARS Coronavirus 2 by RT PCR: NEGATIVE

## 2020-08-27 MED ORDER — PROCHLORPERAZINE MALEATE 10 MG PO TABS
10.0000 mg | ORAL_TABLET | Freq: Four times a day (QID) | ORAL | Status: DC | PRN
  Filled 2020-08-27: qty 1

## 2020-08-27 MED ORDER — METOCLOPRAMIDE HCL 5 MG/ML IJ SOLN
10.0000 mg | Freq: Four times a day (QID) | INTRAMUSCULAR | Status: DC
  Administered 2020-08-27 – 2020-09-01 (×19): 10 mg via INTRAVENOUS
  Filled 2020-08-27 (×19): qty 2

## 2020-08-27 MED ORDER — PIPERACILLIN-TAZOBACTAM 3.375 G IVPB: 3.3750 g | Freq: Three times a day (TID) | INTRAVENOUS | Status: DC

## 2020-08-27 MED ORDER — ONDANSETRON 4 MG PO TBDP: 4.0000 mg | ORAL_TABLET | Freq: Four times a day (QID) | ORAL | Status: DC | PRN

## 2020-08-27 MED ORDER — PROCHLORPERAZINE EDISYLATE 10 MG/2ML IJ SOLN: 5.0000 mg | Freq: Four times a day (QID) | INTRAMUSCULAR | Status: DC | PRN

## 2020-08-27 MED ORDER — PANTOPRAZOLE SODIUM 40 MG IV SOLR: 40.0000 mg | Freq: Every day | INTRAVENOUS | Status: DC

## 2020-08-27 MED ORDER — IOHEXOL 300 MG/ML  SOLN
100.0000 mL | Freq: Once | INTRAMUSCULAR | Status: AC | PRN
  Administered 2020-08-27: 100 mL via INTRAVENOUS

## 2020-08-27 MED ORDER — ONDANSETRON HCL 4 MG/2ML IJ SOLN
4.0000 mg | Freq: Four times a day (QID) | INTRAMUSCULAR | Status: DC | PRN
  Filled 2020-08-27: qty 2

## 2020-08-27 MED ORDER — SODIUM CHLORIDE (PF) 0.9 % IJ SOLN
INTRAMUSCULAR | Status: AC
  Filled 2020-08-27: qty 50

## 2020-08-27 MED ORDER — ONDANSETRON HCL 4 MG/2ML IJ SOLN
4.0000 mg | Freq: Once | INTRAMUSCULAR | Status: AC
  Administered 2020-08-27: 4 mg via INTRAVENOUS

## 2020-08-27 MED ORDER — KCL IN DEXTROSE-NACL 20-5-0.9 MEQ/L-%-% IV SOLN: INTRAVENOUS | Status: DC

## 2020-08-27 MED ORDER — HYDROMORPHONE HCL 1 MG/ML IJ SOLN: 0.5000 mg | INTRAMUSCULAR | Status: DC | PRN

## 2020-08-27 MED ORDER — PIPERACILLIN-TAZOBACTAM 3.375 G IVPB 30 MIN
3.3750 g | Freq: Once | INTRAVENOUS | Status: AC
  Administered 2020-08-27: 3.375 g via INTRAVENOUS
  Filled 2020-08-27: qty 50

## 2020-08-27 MED ORDER — PIPERACILLIN-TAZOBACTAM 3.375 G IVPB
3.3750 g | Freq: Three times a day (TID) | INTRAVENOUS | Status: DC
  Administered 2020-08-27 – 2020-09-01 (×15): 3.375 g via INTRAVENOUS
  Filled 2020-08-27 (×15): qty 50

## 2020-08-27 MED ORDER — KETOROLAC TROMETHAMINE 30 MG/ML IJ SOLN: 30.0000 mg | Freq: Four times a day (QID) | INTRAMUSCULAR | Status: DC | PRN

## 2020-08-27 MED ORDER — PANTOPRAZOLE SODIUM 40 MG IV SOLR
40.0000 mg | Freq: Every day | INTRAVENOUS | Status: DC
  Administered 2020-08-27 – 2020-08-31 (×5): 40 mg via INTRAVENOUS
  Filled 2020-08-27 (×5): qty 40

## 2020-08-27 MED ORDER — PROCHLORPERAZINE MALEATE 10 MG PO TABS: 10.0000 mg | ORAL_TABLET | Freq: Four times a day (QID) | ORAL | Status: DC | PRN

## 2020-08-27 MED ORDER — KCL IN DEXTROSE-NACL 20-5-0.9 MEQ/L-%-% IV SOLN
INTRAVENOUS | Status: DC
  Filled 2020-08-27 (×13): qty 1000

## 2020-08-27 MED ORDER — SODIUM CHLORIDE 0.9 % IV SOLN
12.5000 mg | Freq: Four times a day (QID) | INTRAVENOUS | Status: DC | PRN
  Administered 2020-08-27 – 2020-08-31 (×4): 12.5 mg via INTRAVENOUS
  Filled 2020-08-27 (×5): qty 0.5

## 2020-08-27 MED ORDER — HYDROMORPHONE HCL 1 MG/ML IJ SOLN
0.5000 mg | INTRAMUSCULAR | Status: DC | PRN
  Administered 2020-08-27 – 2020-09-01 (×20): 0.5 mg via INTRAVENOUS
  Filled 2020-08-27 (×21): qty 0.5

## 2020-08-27 MED ORDER — ONDANSETRON HCL 4 MG/2ML IJ SOLN: 4.0000 mg | Freq: Four times a day (QID) | INTRAMUSCULAR | Status: DC | PRN

## 2020-08-27 MED ORDER — KETOROLAC TROMETHAMINE 30 MG/ML IJ SOLN
30.0000 mg | Freq: Four times a day (QID) | INTRAMUSCULAR | Status: AC | PRN
  Administered 2020-08-30: 30 mg via INTRAVENOUS
  Filled 2020-08-27: qty 1

## 2020-08-27 NOTE — H&P (Addendum)
Patient ID: Thomas Rivas, male   DOB: 09/07/1996, 24 y.o.   MRN: [id number]  HPI Thomas Rivas is a 24 y.o. male well-known to me discharged yesterday after a prolonged hospitalization for small bowel obstruction complicated by intra-abdominal abscess and prolonged ileus.  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  Unfortunately there is no specific diagnosis for his dysmotility disorder.  He has had chronic GI issues for the past decade or so.  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  He had a prolonged and complicated hospital course over there as well.  He required reopening of laparotomy as well as a G-tube placement. More recently a month ago I did an expiratory laparotomy for internal hernia.  This was complicated for a fascial dehiscence that we had to fix.  He also developed an intra-abdominal abscess that a drain placed.  We consulted interventional radiology to upsize the drain or do TPA but they felt that he was not needed.  He was only putting SIRS drainage and minimal output therefore I decided to remove it.  Yesterday he did very well and was deemed to be stable for discharge since his ileostomy was working and he was tolerating regular diet. Earlier this morning he went to [facility], ER due to hiccups.  He denies any abdominal pain.  Denies any fevers any chills.  ET scan personally reviewed showing evidence of diffuse bowel dilation.  There is a small collection that has been undrained where the prior drain was.  There is no evidence of free air and there is no evidence of any further complications.  He did have labs in my elevation of the white count and overall some dehydration as compared to prior study. Reports that his ileostomy is working well  HPI  Past Medical HX GI dismotility disorder  Past Surgical History:  Procedure Laterality Date  . ABDOMINAL WOUND DEHISCENCE N/A 08/09/2020   Procedure: ABDOMINAL WOUND  DEHISCENCE REPAIR;  Surgeon: Leafy Ro, MD;  Location: [facility] ORS;  Service: General;  Laterality: N/A;  . COLON SURGERY  12/21/2019   abdominal colectomy w/ileostomy  . EXPLORATORY LAPAROTOMY  12/25/2019  . GASTROJEJUNOSTOMY  12/31/2019  . LAPAROTOMY N/A 08/02/2020   Procedure: EXPLORATORY LAPAROTOMY;  Surgeon: Leafy Ro, MD;  Location: [facility] ORS;  Service: General;  Laterality: N/A;    No family history on file.  Social History Social History   Tobacco Use  . Smoking status: Current Some Day Smoker    Types: Cigarettes  . Smokeless tobacco: Never Used  . Tobacco comment: 1-2 cigarettes 2-3 days per week.  Vaping Use  . Vaping Use: Never used  Substance Use Topics  . Alcohol use: Not Currently  . Drug use: Yes    Types: Marijuana    Allergies  Allergen Reactions  . Lactose Intolerance (Gi)     Current Facility-Administered Medications  Medication Dose Route Frequency Provider Last Rate Last Admin  . chlorproMAZINE (THORAZINE) 12.5 mg in sodium chloride 0.9 % 25 mL IVPB  12.5 mg Intravenous Q6H PRN Cabrini Ruggieri F, MD      . dextrose 5 % and 0.9 % NaCl with KCl 20 mEq/L infusion   Intravenous Continuous Arthuro Canelo F, MD      . HYDROmorphone (DILAUDID) injection 0.5 mg  0.5 mg Intravenous Q4H PRN Laterrica Libman F, MD      . ketorolac (TORADOL) 30 MG/ML injection 30 mg  30 mg  Intravenous Q6H PRN Annalucia Laino F, MD      . metoCLOPramide (REGLAN) injection 10 mg  10 mg Intravenous Q6H Julann Mcgilvray F, MD      . ondansetron (ZOFRAN-ODT) disintegrating tablet 4 mg  4 mg Oral Q6H PRN Jakyron Fabro F, MD       Or  . ondansetron (ZOFRAN) injection 4 mg  4 mg Intravenous Q6H PRN Lori Popowski F, MD      . pantoprazole (PROTONIX) injection 40 mg  40 mg Intravenous QHS Edwar Coe F, MD      . piperacillin-tazobactam (ZOSYN) IVPB 3.375 g  3.375 g Intravenous Q8H Destin Vinsant F, MD      . prochlorperazine (COMPAZINE) tablet 10 mg  10 mg Oral Q6H PRN Madeliene Tejera F, MD          Review of Systems Full ROS  was asked and was negative except for the information on the HPI  Physical Exam There were no vitals taken for this visit. CONSTITUTIONAL: NAD , laying in bed. EYES: Pupils are equal, round,  Sclera are non-icteric. EARS, NOSE, MOUTH AND THROAT: He is wearing a mask Hearing is intact to voice. LYMPH NODES:  Lymph nodes in the neck are normal. RESPIRATORY:  Lungs are clear. There is normal respiratory effort, with equal breath sounds bilaterally, and without pathologic use of accessory muscles. CARDIOVASCULAR: Heart is regular without murmurs, gallops, or rubs. GI: The abdomen is  soft, nontender no peritonitis. There is evidence of good granulation tissue in the upper half of the incision.  There is evidence of an intact repair on the lower half.  There is no evidence of a superficial wound infection.  His ileostomy is to the right side and is clearly functional with a lot of gas and bilious output   There are no palpable masses. There is no hepatosplenomegaly. There are normal bowel sounds in all quadrants. GU: Rectal deferred.   MUSCULOSKELETAL: Normal muscle strength and tone. No cyanosis or edema.   SKIN: Turgor is good and there are no pathologic skin lesions or ulcers. NEUROLOGIC: Motor and sensation is grossly normal. Cranial nerves are grossly intact. PSYCH:  Oriented to person, place and time. Affect is normal.  Data Reviewed  I have personally reviewed the patient's imaging, laboratory findings and medical records.    Assessment/Plan Kyrus is a 24 year old very complex patient with 4 prior laparotomies 2 of them being very recent.  He now has an abdominal collection that needs to be addressed.  We will start IV antibiotics and will ask interventional radiology to place a fresh drain to address the collection appropiately.  He does not need any surgical intervention at this time.  I do think that the drain will help with his overall ileus  pattern/GI dysmotility dysfunction.  There is no evidence of a mechanical bowel obstruction. We will start Thorazine as well as Reglan.  We will keep NG tube for now and start IV fluids as well. There is no need for immediate surgical intervention at this time. We will hold off any Lovenox for upcoming IR procedure   Sterling Big, MD FACS General Surgeon 08/27/2020, 10:30 AM

## 2020-08-27 NOTE — ED Provider Notes (Addendum)
[facility]-EMERGENCY DEPT Provider Note  CSN: [id number] Arrival date & time: 08/26/20 2220  Chief Complaint(s) Hiccups  HPI Thomas Rivas is a 24 y.o. male with a complicated history including recent surgery for high-grade small bowel obstruction requiring ileostomy.  Patient returns today after being discharged from the hospital earlier for hiccups and nonbloody nonbilious emesis.  Hiccups appear to be positional exacerbated with being either straight up or lying flat.  They resolve if he is at approximately 45 degree angle.  He believes the emesis is related to his hiccups.  He denies any abdominal pain.  Patient is having output from the ostomy.  No fevers or chills.  No other physical complaints.  HPI  Past Medical History Past Medical History:  Diagnosis Date  . Hirschsprung's disease    Patient Active Problem List   Diagnosis Date Noted  . Protein-calorie malnutrition, severe 08/07/2020  . Tachycardia   . Hypernatremia   . SBO (small bowel obstruction) (HCC) 07/28/2020  . Abdominal pain 07/28/2020  . Nausea & vomiting 07/28/2020  . Leukocytosis 07/28/2020  . Chronic idiopathic constipation   . 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 Tobacco use 04/22/2019  . Constipation by delayed colonic transit 04/22/2019   Home Medication(s) Prior to Admission medications   Medication Sig Start Date End Date Taking? Authorizing Provider  ibuprofen (ADVIL) 800 MG tablet Take 1 tablet (800 mg total) by mouth every 8 (eight) hours as needed. Patient taking differently: Take 800 mg by mouth every 8 (eight) hours as needed for mild pain.  08/26/20  Yes Donovan KailSchulz, Zachary R, PA-C  metoCLOPramide (REGLAN) 5 MG tablet Take 1 tablet (5 mg total) by mouth every 6 (six) hours as needed for nausea or vomiting (bowel motility). 08/26/20  Yes Lynden OxfordSchulz, Zachary R, PA-C  oxyCODONE (OXY IR/ROXICODONE) 5 MG immediate release tablet 1-2 tablets (5-10 mg total) by Per NG tube route  every 4 (four) hours as needed for severe pain or breakthrough pain. 08/26/20  Yes Donovan KailSchulz, Zachary R, PA-C  acetaminophen (TYLENOL) 500 MG tablet Take 2 tablets (1,000 mg total) by mouth every 6 (six) hours. 08/26/20   Donovan KailSchulz, Zachary R, PA-C                                                                                                                                    Past Surgical History Past Surgical History:  Procedure Laterality Date  . ABDOMINAL WOUND DEHISCENCE N/A 08/09/2020   Procedure: ABDOMINAL WOUND DEHISCENCE REPAIR;  Surgeon: Leafy RoPabon, Diego F, MD;  Location: [facility] ORS;  Service: General;  Laterality: N/A;  . COLON SURGERY  12/21/2019   abdominal colectomy w/ileostomy  . EXPLORATORY LAPAROTOMY  12/25/2019  . GASTROJEJUNOSTOMY  12/31/2019  . LAPAROTOMY N/A 08/02/2020   Procedure: EXPLORATORY LAPAROTOMY;  Surgeon: Leafy RoPabon, Diego F, MD;  Location: [facility] ORS;  Service: General;  Laterality: N/A;   Family History History reviewed. No pertinent family history.  Social History Social History   Tobacco Use  . Smoking status: Current Some Day Smoker    Types: Cigarettes  . Smokeless tobacco: Never Used  . Tobacco comment: 1-2 cigarettes 2-3 days per week.  Vaping Use  . Vaping Use: Never used  Substance Use Topics  . Alcohol use: Not Currently  . Drug use: Yes    Types: Marijuana   Allergies Lactose intolerance (gi)  Review of Systems Review of Systems All other systems are reviewed and are negative for acute change except as noted in the HPI  Physical Exam Vital Signs  I have reviewed the triage vital signs BP (!) 121/92 (BP Location: Left Arm)   Pulse (!) 119   Temp 98.2 F (36.8 C) (Oral)   Resp 16   Ht 5\' 10"  (1.778 m)   Wt 54.4 kg   SpO2 100%   BMI 17.22 kg/m   Physical Exam Vitals reviewed.  Constitutional:      General: He is not in acute distress.    Appearance: He is well-developed and normal weight. He is not diaphoretic.  HENT:     Head:  Normocephalic and atraumatic.     Jaw: No trismus.     Right Ear: External ear normal.     Left Ear: External ear normal.     Nose: Nose normal.  Eyes:     General: No scleral icterus.    Conjunctiva/sclera: Conjunctivae normal.  Neck:     Trachea: Phonation normal.  Cardiovascular:     Rate and Rhythm: Normal rate and regular rhythm.  Pulmonary:     Effort: Pulmonary effort is normal. No respiratory distress.     Breath sounds: No stridor.  Abdominal:     General: There is no distension.     Tenderness: There is no abdominal tenderness.       Comments: Midline surgical incision. Partial open wound of superior aspect of incision. Sutures in place to remainder of incision. No discharge or surrounding erythema.  Musculoskeletal:        General: Normal range of motion.     Cervical back: Normal range of motion.  Neurological:     Mental Status: He is alert and oriented to person, place, and time.  Psychiatric:        Behavior: Behavior normal.     ED Results and Treatments Labs (all labs ordered are listed, but only abnormal results are displayed) Labs Reviewed  CBC WITH DIFFERENTIAL/PLATELET - Abnormal; Notable for the following components:      Result Value   WBC 13.9 (*)    Hemoglobin 10.1 (*)    HCT 32.7 (*)    MCV 73.0 (*)    MCH 22.5 (*)    RDW 19.2 (*)    Platelets 602 (*)    Neutro Abs 12.1 (*)    All other components within normal limits  COMPREHENSIVE METABOLIC PANEL - Abnormal; Notable for the following components:   Sodium 133 (*)    Glucose, Bld 101 (*)    Total Protein 9.8 (*)    Albumin 3.3 (*)    AST 44 (*)    ALT 68 (*)    All other components within normal limits  RESP PANEL BY RT-PCR (FLU A&B, COVID) ARPGX2  EKG  EKG Interpretation  Date/Time:    Ventricular Rate:    PR Interval:    QRS Duration:   QT Interval:    QTC  Calculation:   R Axis:     Text Interpretation:        Radiology CT ABDOMEN PELVIS W CONTRAST  Result Date: 08/27/2020 CLINICAL DATA:  Hiccups. Recent hospitalization for small-bowel obstruction. EXAM: CT ABDOMEN AND PELVIS WITH CONTRAST TECHNIQUE: Multidetector CT imaging of the abdomen and pelvis was performed using the standard protocol following bolus administration of intravenous contrast. CONTRAST:  OMNIPAQUE IOHEXOL 300 MG/ML  SOLN COMPARISON:  CT dated August 18, 2020 FINDINGS: Lower chest: The lung bases are clear. The heart size is normal. Hepatobiliary: The liver is normal. Normal gallbladder.There is no biliary ductal dilation. Pancreas: Normal contours without ductal dilatation. No peripancreatic fluid collection. Spleen: Unremarkable. Adrenals/Urinary Tract: --Adrenal glands: Unremarkable. --Right kidney/ureter: No hydronephrosis or radiopaque kidney stones. --Left kidney/ureter: No hydronephrosis or radiopaque kidney stones. --Urinary bladder: Unremarkable. Stomach/Bowel: --Stomach/Duodenum: The esophagus is dilated. There is diffuse esophageal wall thickening. The stomach is dilated with an air-fluid level. --Small bowel: There is a persistent proximal small bowel obstruction with dilated loops of small bowel measuring up to approximately 5 cm with associated air-fluid levels. There is a transition point in the left mid abdomen. The majority of the small bowel is relatively decompressed. There is a right lower quadrant ileostomy. --Colon: The patient is status post prior colectomy. --Appendix: Surgically absent. Vascular/Lymphatic: Normal course and caliber of the major abdominal vessels. --No retroperitoneal lymphadenopathy. --No mesenteric lymphadenopathy. --No pelvic or inguinal lymphadenopathy. Reproductive: Unremarkable Other: The previously demonstrated percutaneous drain has been removed. There is a persistent abscess in the low midline abdomen measuring approximately 6.2 x  1.5 cm (previously measuring 7.8 x 3 cm. There is a smaller abscess adjacent to the ileostomy measuring approximately 2.2 cm (axial series 2, image 56). Musculoskeletal. No acute displaced fractures. IMPRESSION: 1. Proximal small bowel obstruction as detailed above. 2. Persistent but improved intra-abdominal abscesses status post drain removal. 3. Dilated esophagus with diffuse esophageal wall thickening may be secondary to esophagitis. 4. Additional chronic findings as detailed above. Electronically Signed   By: Katherine Mantle M.D.   On: 08/27/2020 01:58    Pertinent labs & imaging results that were available during my care of the patient were reviewed by me and considered in my medical decision making (see chart for details).  Medications Ordered in ED Medications  sodium chloride 0.9 % bolus 1,000 mL (0 mLs Intravenous Stopped 08/27/20 0116)    Followed by  0.9 %  sodium chloride infusion (1,000 mLs Intravenous New Bag/Given 08/27/20 0142)  sodium chloride (PF) 0.9 % injection (has no administration in time range)  alum & mag hydroxide-simeth (MAALOX/MYLANTA) 200-200-20 MG/5ML suspension 30 mL (30 mLs Oral Given 08/27/20 0047)    And  lidocaine (XYLOCAINE) 2 % viscous mouth solution 15 mL (15 mLs Oral Given 08/27/20 0047)  ondansetron (ZOFRAN) injection 4 mg (4 mg Intravenous Given 08/27/20 0048)  iohexol (OMNIPAQUE) 300 MG/ML solution 100 mL (100 mLs Intravenous Contrast Given 08/27/20 0132)  Procedures Procedures  (including critical care time)  Medical Decision Making / ED Course I have reviewed the nursing notes for this encounter and the patient's prior records (if available in EHR or on provided paperwork).   Kalib Bhagat Wingerter was evaluated in Emergency Department on 08/27/2020 for the symptoms described in the history of present illness. He was  evaluated in the context of the global COVID-19 pandemic, which necessitated consideration that the patient might be at risk for infection with the SARS-CoV-2 virus that causes COVID-19. Institutional protocols and algorithms that pertain to the evaluation of patients at risk for COVID-19 are in a state of rapid change based on information released by regulatory bodies including the CDC and federal and state organizations. These policies and algorithms were followed during the patient's care in the ED.    Clinical Course as of Aug 29 755  Tue Aug 27, 2020  [id number] Given the patient's recent surgical history and complicated past medical history, screening labs were obtained revealing new leukocytosis with compared to labs earlier in the day prior to being discharged.  CT scan was obtained and revealed persistent proximal intestinal and stomach dilatation with a new transition point in the left abdomen concerning for bowel obstruction.  Patient still has intra-abdominal abscess which is improving.  Patient requested to stay here at [facility] long as opposed to being transferred to [facility], where his surgery was performed.  We will place NG tube and admit to medicine.  Surgery will likely be consulted in the morning given his complicated surgical history.   [PC]  [phone] Spoke with Dr Toniann Fail regarding admission, who requested we touch base with EGS here to determine whether they are ok with patient staying.    [PC]  [phone] Spoke with Dr. Maisie Fus who felt the patient would be better served at [facility]. We discussed this with the patient, who agreed to being transferred to [facility].   [PC]  0405 Consulted and spoke with Dr. Lady Gary, [facility] EGS, who requested ED to ED transfer.   [PC]  0410 Spoke with [facility] ED, who will discuss transfer with Dr. Lady Gary.   [PC]  [phone] [facility] ED called back. Plan is wait for Dr. Lady Gary to speak with Dr. [name] (primary surgeon) in the morning regarding direct admission.   [PC]   [phone] Received a call from Dr. [name], who will arrange for accepting patient   [PC]  0653 Dr. [name] accepted patient and placed admission orders for [facility]. Will transfer once bed is available.    [PC]    Clinical Course User Index [PC] Doneshia Hill, Amadeo Garnet, MD     Final Clinical Impression(s) / ED Diagnoses Final diagnoses:  SBO (small bowel obstruction) (HCC)  Intra-abdominal abscess (HCC)      This chart was dictated using voice recognition software.  Despite best efforts to proofread,  errors can occur which can change the documentation meaning.      Nira Conn, MD 08/27/20 0541    Nira Conn, MD 08/28/20 [phone]

## 2020-08-27 NOTE — Plan of Care (Signed)

## 2020-08-27 NOTE — Progress Notes (Signed)
Referring Physician(s): Pabon,D  Supervising Physician: Malachy Moan  Patient Status:  [facility] - In-pt  Chief Complaint:  Hiccups/emesis/abdominal fluid collection/bowel obstruction  Subjective: Patient familiar to IR service from drainage of a left lower quadrant fluid collection on 08/12/2020; cultures revealed multiple organisms, none predominant.  His abdominal drain was removed yesterday by the surgical team  prior to discharge home.  He returned to [facility] later that evening with complaints of hiccups and nonbloody/nonbilious emesis.  Follow-up CT revealed proximal small bowel obstruction with persistent but improved intra-abdominal abscess at site of previous drain.  He was subsequently transferred back to [facility] for surgical management.  Request now received for replacement of pre-existing left lower abdominal drain.  He currently denies fever, headache, chest pain, dyspnea, cough, domino/back pain, nausea, vomiting or bleeding.  He has NG tube in place.  Additional history as below.  Past Medical History:  Diagnosis Date  . Hirschsprung's disease    Past Surgical History:  Procedure Laterality Date  . ABDOMINAL WOUND DEHISCENCE N/A 08/09/2020   Procedure: ABDOMINAL WOUND DEHISCENCE REPAIR;  Surgeon: Leafy Ro, MD;  Location: [facility] ORS;  Service: General;  Laterality: N/A;  . COLON SURGERY  12/21/2019   abdominal colectomy w/ileostomy  . EXPLORATORY LAPAROTOMY  12/25/2019  . GASTROJEJUNOSTOMY  12/31/2019  . LAPAROTOMY N/A 08/02/2020   Procedure: EXPLORATORY LAPAROTOMY;  Surgeon: Leafy Ro, MD;  Location: [facility] ORS;  Service: General;  Laterality: N/A;     Allergies: Lactose intolerance (gi)  Medications: Prior to Admission medications   Medication Sig Start Date End Date Taking? Authorizing Provider  acetaminophen (TYLENOL) 500 MG tablet Take 2 tablets (1,000 mg total) by mouth every 6 (six) hours. 08/26/20   Donovan Kail, PA-C  ibuprofen  (ADVIL) 800 MG tablet Take 1 tablet (800 mg total) by mouth every 8 (eight) hours as needed. Patient taking differently: Take 800 mg by mouth every 8 (eight) hours as needed for mild pain.  08/26/20   Donovan Kail, PA-C  metoCLOPramide (REGLAN) 5 MG tablet Take 1 tablet (5 mg total) by mouth every 6 (six) hours as needed for nausea or vomiting (bowel motility). 08/26/20   Donovan Kail, PA-C  oxyCODONE (OXY IR/ROXICODONE) 5 MG immediate release tablet 1-2 tablets (5-10 mg total) by Per NG tube route every 4 (four) hours as needed for severe pain or breakthrough pain. 08/26/20   Donovan Kail, PA-C     Vital Signs: BP 107/67 (BP Location: Left Arm)   Pulse 97   Temp 98.4 F (36.9 C) (Oral)   Resp 16   SpO2 100%   Physical Exam awake/alert; NG tube in place; chest- CTA bilat; heart- RRR; abd- soft,+BS, intact ileostomy, bandages over previous drain site and upper abdominal surg incision.No LE edema.  Imaging: DG Abd 1 View  Result Date: 08/27/2020 CLINICAL DATA:  NG tube placement.  Abdominal distention. EXAM: ABDOMEN - 1 VIEW COMPARISON:  CT 08/27/2020.  Abdomen 08/23/2020. FINDINGS: NG tube noted, its tip has been advanced into the stomach. Ostomy noted over the right lower quadrant. Persistent prominent small-bowel distention. No free air. Contrast noted in the renal collecting systems. No acute bony abnormality. IMPRESSION: NG tube noted, its tip has been advanced into the stomach. Persistent prominent small-bowel distention. Findings consistent with persistent small-bowel obstruction. Electronically Signed   By: Maisie Fus  Register   On: 08/27/2020 07:19   CT ABDOMEN PELVIS W CONTRAST  Result Date: 08/27/2020 CLINICAL DATA:  Hiccups. Recent hospitalization  for small-bowel obstruction. EXAM: CT ABDOMEN AND PELVIS WITH CONTRAST TECHNIQUE: Multidetector CT imaging of the abdomen and pelvis was performed using the standard protocol following bolus administration of intravenous  contrast. CONTRAST:  OMNIPAQUE IOHEXOL 300 MG/ML  SOLN COMPARISON:  CT dated August 18, 2020 FINDINGS: Lower chest: The lung bases are clear. The heart size is normal. Hepatobiliary: The liver is normal. Normal gallbladder.There is no biliary ductal dilation. Pancreas: Normal contours without ductal dilatation. No peripancreatic fluid collection. Spleen: Unremarkable. Adrenals/Urinary Tract: --Adrenal glands: Unremarkable. --Right kidney/ureter: No hydronephrosis or radiopaque kidney stones. --Left kidney/ureter: No hydronephrosis or radiopaque kidney stones. --Urinary bladder: Unremarkable. Stomach/Bowel: --Stomach/Duodenum: The esophagus is dilated. There is diffuse esophageal wall thickening. The stomach is dilated with an air-fluid level. --Small bowel: There is a persistent proximal small bowel obstruction with dilated loops of small bowel measuring up to approximately 5 cm with associated air-fluid levels. There is a transition point in the left mid abdomen. The majority of the small bowel is relatively decompressed. There is a right lower quadrant ileostomy. --Colon: The patient is status post prior colectomy. --Appendix: Surgically absent. Vascular/Lymphatic: Normal course and caliber of the major abdominal vessels. --No retroperitoneal lymphadenopathy. --No mesenteric lymphadenopathy. --No pelvic or inguinal lymphadenopathy. Reproductive: Unremarkable Other: The previously demonstrated percutaneous drain has been removed. There is a persistent abscess in the low midline abdomen measuring approximately 6.2 x 1.5 cm (previously measuring 7.8 x 3 cm. There is a smaller abscess adjacent to the ileostomy measuring approximately 2.2 cm (axial series 2, image 56). Musculoskeletal. No acute displaced fractures. IMPRESSION: 1. Proximal small bowel obstruction as detailed above. 2. Persistent but improved intra-abdominal abscesses status post drain removal. 3. Dilated esophagus with diffuse esophageal wall  thickening may be secondary to esophagitis. 4. Additional chronic findings as detailed above. Electronically Signed   By: Katherine Mantle M.D.   On: 08/27/2020 01:58    Labs:  CBC: Recent Labs    08/19/20 0552 08/21/20 0454 08/26/20 0440 08/27/20 0048  WBC 10.0 8.0 5.2 13.9*  HGB 8.5* 8.9* 7.3* 10.1*  HCT 26.5* 28.6* 22.4* 32.7*  PLT 1,055* 927* 406* 602*    COAGS: Recent Labs    07/28/20 2106  INR 1.1    BMP: Recent Labs    08/22/20 0630 08/23/20 0505 08/26/20 0635 08/27/20 0048  NA 136 134* 135 133*  K 4.2 4.3 4.0 4.6  CL 101 101 102 101  CO2 26 25 26 24   GLUCOSE 100* 108* 117* 101*  BUN 19 14 17 18   CALCIUM 8.9 8.3* 8.7* 9.4  CREATININE 0.82 0.71 0.80 0.92  GFRNONAA >60 >60 >60 >60    LIVER FUNCTION TESTS: Recent Labs    08/19/20 0552 08/22/20 0630 08/26/20 0635 08/27/20 0048  BILITOT 0.6 0.4 0.4 0.4  AST 31 30 19  44*  ALT 46* 46* 29 68*  ALKPHOS 138* 122 97 124  PROT 8.7* 7.8 7.6 9.8*  ALBUMIN 2.4* 2.3* 2.4* 3.3*    Assessment and Plan: 23 y.o. male 56 Days Post-Op s/p closure of fascial dehiscence with internal and external retention sutrures22Days Post-Ops/p exploratory laparotomy and lysis of adhesions for small bowel obstruction secondary to internal hernia, complicated by pertinent comorbidities includings/p subtotal colectomy for reportedly Hirschsprung's disease however he does nothave a formal diagnosis of this and was pending outpatient work up with GI; s/p drainage of left lower abdominal fluid collection on 11/8 with drain removal by surgery and d/c home on 11/22; now readmitted with persistent proximal small bowel obstruction  as well as persistent but improved intra-abdominal fluid collection at previous drain site.  Request received for new left lower abdominal drain placement.  Patient afebrile, WBC 13.9, hemoglobin 10.1, creatinine normal, COVID-19 negative.  Imaging studies were reviewed by Dr. Archer Asa. Risks and benefits  discussed with the patient including bleeding, infection, damage to adjacent structures, bowel perforation/fistula connection, and sepsis.  All of the patient's questions were answered, patient is agreeable to proceed. Consent signed and in chart.  Procedure scheduled for 11/24   Electronically Signed: D. Jeananne Rama, PA-C 08/27/2020, 3:51 PM   I spent a total of 20 minutes at the the patient's bedside AND on the patient's hospital floor or unit, greater than 50% of which was counseling/coordinating care for CT-guided drainage of left lower abdominal fluid collection    Patient ID: [name], male   DOB: Feb 03, 1996, 24 y.o.   MRN: [id number]

## 2020-08-27 NOTE — Progress Notes (Signed)
A consult was received from an ED physician for zosyn per pharmacy dosing.  The patient's profile has been reviewed for ht/wt/allergies/indication/available labs.   A one time order has been placed for zosyn 3.375 gm.    Further antibiotics/pharmacy consults should be ordered by admitting physician if indicated.                       Thank you, Herby Abraham, Pharm.D 08/27/2020 6:27 AM

## 2020-08-27 NOTE — Progress Notes (Signed)
Pt refused IS education stating "I am not doing that while I have this tube in my nose". IS left in room for education

## 2020-08-27 NOTE — ED Notes (Signed)
Report given to Annabelle Harman, RN at [facility]. [facility] called for transport.

## 2020-08-28 ENCOUNTER — Inpatient Hospital Stay: Payer: Medicaid Other

## 2020-08-28 LAB — COMPREHENSIVE METABOLIC PANEL
ALT: 38 U/L (ref 0–44)
AST: 19 U/L (ref 15–41)
Albumin: 2.6 g/dL — ABNORMAL LOW (ref 3.5–5.0)
Alkaline Phosphatase: 110 U/L (ref 38–126)
Anion gap: 10 (ref 5–15)
BUN: 17 mg/dL (ref 6–20)
CO2: 26 mmol/L (ref 22–32)
Calcium: 8.9 mg/dL (ref 8.9–10.3)
Chloride: 103 mmol/L (ref 98–111)
Creatinine, Ser: 0.98 mg/dL (ref 0.61–1.24)
GFR, Estimated: >60 mL/min (ref >60)
Glucose, Bld: 119 mg/dL — ABNORMAL HIGH (ref 70–99)
Potassium: 3.8 mmol/L (ref 3.5–5.1)
Sodium: 139 mmol/L (ref 135–145)
Total Bilirubin: 0.3 mg/dL (ref 0.3–1.2)
Total Protein: 7.6 g/dL (ref 6.5–8.1)

## 2020-08-28 LAB — CBC
HCT: 27.8 % — ABNORMAL LOW (ref 39.0–52.0)
Hemoglobin: 8.6 g/dL — ABNORMAL LOW (ref 13.0–17.0)
MCH: 22.1 pg — ABNORMAL LOW (ref 26.0–34.0)
MCHC: 30.9 g/dL (ref 30.0–36.0)
MCV: 71.5 fL — ABNORMAL LOW (ref 80.0–100.0)
Platelets: 448 10*3/uL — ABNORMAL HIGH (ref 150–400)
RBC: 3.89 MIL/uL — ABNORMAL LOW (ref 4.22–5.81)
RDW: 18.9 % — ABNORMAL HIGH (ref 11.5–15.5)
WBC: 6.2 10*3/uL (ref 4.0–10.5)
nRBC: 0 % (ref 0.0–0.2)

## 2020-08-28 LAB — PHOSPHORUS: Phosphorus: 4.5 mg/dL (ref 2.5–4.6)

## 2020-08-28 LAB — MAGNESIUM: Magnesium: 1.8 mg/dL (ref 1.7–2.4)

## 2020-08-28 MED ORDER — FENTANYL CITRATE (PF) 100 MCG/2ML IJ SOLN
INTRAMUSCULAR | Status: AC | PRN
Start: 2020-08-28 — End: 2020-08-28
  Administered 2020-08-28 (×2): 50 ug via INTRAVENOUS

## 2020-08-28 MED ORDER — MIDAZOLAM HCL 2 MG/2ML IJ SOLN
INTRAMUSCULAR | Status: AC
  Filled 2020-08-28: qty 4

## 2020-08-28 MED ORDER — FENTANYL CITRATE (PF) 100 MCG/2ML IJ SOLN
INTRAMUSCULAR | Status: AC
  Filled 2020-08-28: qty 2

## 2020-08-28 MED ORDER — FENTANYL CITRATE (PF) 100 MCG/2ML IJ SOLN
INTRAMUSCULAR | Status: AC
  Filled 2020-08-28: qty 4

## 2020-08-28 MED ORDER — SODIUM CHLORIDE 0.9% FLUSH
5.0000 mL | Freq: Three times a day (TID) | INTRAVENOUS | Status: DC
  Administered 2020-08-28 – 2020-09-01 (×11): 5 mL

## 2020-08-28 MED ORDER — MIDAZOLAM HCL 2 MG/2ML IJ SOLN
INTRAMUSCULAR | Status: AC | PRN
  Administered 2020-08-28: 1 mg via INTRAVENOUS
  Administered 2020-08-28: 2 mg via INTRAVENOUS
  Administered 2020-08-28: 1 mg via INTRAVENOUS

## 2020-08-28 MED ORDER — SODIUM CHLORIDE 0.9 % IV SOLN
INTRAVENOUS | Status: AC | PRN
  Administered 2020-08-28: 10 mL/h via INTRAVENOUS

## 2020-08-28 NOTE — Progress Notes (Signed)
[facility] SURGICAL PROGRESS NOTE  Hospital Day(s): 1.   Interval History:  Patient seen and examined no acute events or new complaints overnight.  Patient reports he is feeling good this morning Incisional soreness as expected but otherwise no fever, chills, nausea, emesis Previous seen leukocytosis on admission resolved, now 6.2K Hgb trended down, 8.6, suspect this is dilutional Thrombocytosis improved, PLT down to 448k Renal function remains normal, sCr - 0.98, UO - 175 ccs + unmeasured No electrolyte derangements NGT output recorded at 1.2L; however this is very diluted consistent with him drinking water overnight Ileostomy functioning Plan for replacement of intra-abdominal drain today with IR  Vital signs in last 24 hours: [min-max] current  Temp:  [98 F (36.7 C)-98.4 F (36.9 C)] 98.2 F (36.8 C) (11/24 0829) Pulse Rate:  [91-105] 96 (11/24 0829) Resp:  [16-20] 18 (11/24 0829) BP: (100-119)/(62-99) 102/62 (11/24 0829) SpO2:  [100 %] 100 % (11/24 0829) Weight:  [55 kg] 55 kg (11/24 0127)     Height: 5\' 10"  (177.8 cm) Weight: 55 kg BMI (Calculated): 17.4   Intake/Output last 2 shifts:  11/23 0701 - 11/24 0700 In: 362.2 [I.V.:337.2; IV Piggyback:25] Out: 1375 [Urine:175; Emesis/NG output:1200]   Physical Exam:  Constitutional: alert, cooperative and no distress  HEENT: NGT in palce Respiratory: breathing non-labored at rest  Cardiovascular: regular rate and sinus rhythm  Gastrointestinal: soft, non-tender, and non-distended, ileostomy in the RLQ with gas and stool in bag. Integumentary:Midline laparotomy healingvia secondary intention, the inferior half of this incision has skin sutures present, the superior portion is granulation tissue healing via secondary intention.  Labs:  CBC Latest Ref Rng & Units 08/28/2020 08/27/2020 08/26/2020  WBC 4.0 - 10.5 K/uL 6.2 13.9(H) 5.2  Hemoglobin 13.0 - 17.0 g/dL 08/28/2020) 10.1(L) 7.3(L)  Hematocrit 39 - 52 %  27.8(L) 32.7(L) 22.4(L)  Platelets 150 - 400 K/uL 448(H) 602(H) 406(H)   CMP Latest Ref Rng & Units 08/28/2020 08/27/2020 08/26/2020  Glucose 70 - 99 mg/dL 08/28/2020) 893(Y) 101(B)  BUN 6 - 20 mg/dL 17 18 17   Creatinine 0.61 - 1.24 mg/dL 510(C 5.85  Sodium 135 - 145 mmol/L 139 133(L) 135  Potassium 3.5 - 5.1 mmol/L 3.8 4.6 4.0  Chloride 98 - 111 mmol/L 103 101 102  CO2 22 - 32 mmol/L 26 24 26   Calcium 8.9 - 10.3 mg/dL 8.9 9.4 2.77)  Total Protein 6.5 - 8.1 g/dL 7.6 8.24) 7.6  Total Bilirubin 0.3 - 1.2 mg/dL 0.3 0.4 0.4  Alkaline Phos 38 - 126 U/L 110 124 97  AST 15 - 41 U/L 19 44(H) 19  ALT 0 - 44 U/L 38 68(H) 29     Imaging studies: No new pertinent imaging studies   Assessment/Plan:  24 y.o. male 10 Days Post-Op s/p closure of fascial dehiscence with internal and external retention sutrures24Days Post-Op s/p exploratory laparotomy and lysis of adhesions for small bowel obstruction secondary to internal hernia, complicated by pertinent comorbidities includings/p subtotal colectomy for reportedly Hirschsprung's disease however he does nothave a formal diagnosis of this and was pending outpatient work up with GI   - Plan on IR placement of percutaneous drain for intra-abdominal fluid collection   - Continue NPO and NGT for now; after drain placement I think it would be reasonable to clamp NGT and trial CLD  - Continue IV Abx (Zosyn)  - Monitor abdominal examination; on-going bowel function  - May need to restart motility agents (Reglan +/- Erythromycin)    - Continue  local wound care: wet to dry dressingwith vaselinedaily  - Pain control prn; antiemetics prn - Monitor leukocytosis; resolved - Monitor thrombocytosis; improved              - Mobilize  All of the above findings and recommendations were discussed with the patient, and the medical team, and all of patient's questions were answered to his expressed satisfaction.  -- Lynden Oxford,  PA-C [facility] 08/28/2020, 9:09 AM [phone] M-F: 7am - 4pm

## 2020-08-28 NOTE — Procedures (Signed)
Interventional Radiology Procedure Note  Procedure: Placement of a 62F drain into abdominal fluid collection. Aspiration yields a few mL of thick purulent fluid.   Complications: None  Estimated Blood Loss: None  Recommendations: - Drain to JP - Flush at least q shift   Signed,  Sterling Big, MD

## 2020-08-28 NOTE — Progress Notes (Signed)
Initial Nutrition Assessment  DOCUMENTATION CODES:   Severe malnutrition in context of acute illness/injury  INTERVENTION:   RD will monitor for diet advancement vs the need for nutrition support  Pt at moderate refeed risk; recommend monitor potassium, magnesium and phosphorus labs daily until stable  NUTRITION DIAGNOSIS:   Severe Malnutrition related to acute illness as evidenced by 14 percent weight loss in 1 month, moderate to severe fat depletions, moderate to severe muscle depletions.  GOAL:   Patient will meet greater than or equal to 90% of their needs  MONITOR:   Diet advancement, Labs, Weight trends, Skin, I & O's  REASON FOR ASSESSMENT:   Other (Comment) (Low BMI)    ASSESSMENT:   24 y.o. 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 Pt is well known to this RD from his recent previous admit. Pt required prolonged TPN during his last admit up until 11/22; pt was discharged home the day TPN was discontinued. TPN was complicated by hyperglycemia and hypertriglyceridemia that was resolved by addition of insulin to the TPN. Pt lost 19lbs(14%) over the course of a month during his last admit; this is significant weight loss. Pt was eating ~25% of meals at the time of his discharge. RD discussed with pt today ways to increase his calorie and protein intake once her returns home by use of supplements and high calorie foods. Of note, pt is lactose intolerant. Pt does not like Ensure supplements but reports that he is willing to drink them to help him gain back his weight (prefers chocolate). Pt  is currently NPO. NGT in place with 1227m output; however, pt has been drinking water. IR drain remains in place for intra-abdominal fluid collection. Plan for today is new left lower abdominal drain placement. RD will monitor for diet advancement vs the need for nutrition support. Pt is likely at refeed risk; recommend monitor electrolytes as pt initiated on IV dextrose.   Medications reviewed and include: reglan, protonix, NaCl w/ KCl and 5% dextrose _0 /hr, zosyn  Labs reviewed: K 3.8 wnl, P 4.5 wnl, Mg 1.8 wnl Hgb 8.6(L), Hct 27.8(L), MCV 71.5(L), MCH 22.1(L)  NUTRITION - FOCUSED PHYSICAL EXAM:    Most Recent Value  Orbital Region Mild depletion  Upper Arm Region Severe depletion  Thoracic and Lumbar Region Moderate depletion  Buccal Region Mild depletion  Temple Region Mild depletion  Clavicle Bone Region Moderate depletion  Clavicle and Acromion Bone Region Moderate depletion  Scapular Bone Region Mild depletion  Dorsal Hand Mild depletion  Patellar Region Severe depletion  Anterior Thigh Region Severe depletion  Posterior Calf Region Severe depletion  Edema (RD Assessment) None  Hair Reviewed  Eyes Reviewed  Mouth Reviewed  Skin Reviewed  Nails Reviewed     Diet Order:   Diet Order            Diet NPO time specified Except for: Ice Chips  Diet effective now                EDUCATION NEEDS:   Education needs have been addressed  Skin:  Skin Assessment: Reviewed RN Assessment (closed incision abdomen)  Last BM:  11/23- Type 7 via ostomy  Height:   Ht Readings from Last 1 Encounters:  08/28/20 5' 10" (1.778 m)    Weight:   Wt Readings from Last 1 Encounters:  08/28/20 55 kg    Ideal Body Weight:  75.45 kg  BMI:  Body mass index is 17.4 kg/m.  Estimated Nutritional Needs:   Kcal:  2100-2400kcal/day  Protein:  110-120g/day  Fluid:  1.7-1.9L/day  Koleen Distance MS, RD, LDN Please refer to [facility] for RD and/or RD on-call/weekend/after hours  pager

## 2020-08-29 NOTE — Progress Notes (Signed)
[facility] SURGICAL PROGRESS NOTE  Hospital Day(s): 2.   Interval History:  Patient seen and examined no acute events or new complaints overnight.  Patient reports he is feeling good this morning Incisional soreness as expected but otherwise no fever, chills, nausea, emesis Had drain replaced by IR yesterday NGT output recorded at 1.2L; however this is very diluted consistent with him drinking water overnight Ileostomy functioning On CLD, tolerating without issues.  Vital signs in last 24 hours: [min-max] current  Temp:  [98 F (36.7 C)-98.7 F (37.1 C)] 98.1 F (36.7 C) (11/25 0851) Pulse Rate:  [61-113] 90 (11/25 0851) Resp:  [9-21] 16 (11/25 0851) BP: (93-173)/(55-73) 98/61 (11/25 0851) SpO2:  [94 %-100 %] 100 % (11/25 0851) Weight:  [54.4 kg] 54.4 kg (11/24 1344)     Height: 5\' 10"  (177.8 cm) Weight: 54.4 kg BMI (Calculated): 17.22   Intake/Output last 2 shifts:  11/24 0701 - 11/25 0700 In: 1385 [P.O.:720; I.V.:600; IV Piggyback:50] Out: 2010 [Urine:325; Emesis/NG output:1200; Drains:10; Stool:475]   Physical Exam:  Constitutional: alert, cooperative and no distress  HEENT: NGT in palce Respiratory: breathing non-labored at rest  Cardiovascular: regular rate and sinus rhythm  Gastrointestinal: soft, non-tender, and non-distended, ileostomy in the RLQ with gas and stool in bag. Integumentary:Midline laparotomy healingvia secondary intention, the inferior half of this incision has skin sutures present, the superior portion is granulation tissue healing via secondary intention.  Labs:  CBC Latest Ref Rng & Units 08/28/2020 08/27/2020 08/26/2020  WBC 4.0 - 10.5 K/uL 6.2 13.9(H) 5.2  Hemoglobin 13.0 - 17.0 g/dL 08/28/2020) 10.1(L) 7.3(L)  Hematocrit 39 - 52 % 27.8(L) 32.7(L) 22.4(L)  Platelets 150 - 400 K/uL 448(H) 602(H) 406(H)   CMP Latest Ref Rng & Units 08/28/2020 08/27/2020 08/26/2020  Glucose 70 - 99 mg/dL 08/28/2020) 510(C) 585(I)  BUN 6 - 20 mg/dL 17  18 17   Creatinine 0.61 - 1.24 mg/dL 778(E 4.23  Sodium 135 - 145 mmol/L 139 133(L) 135  Potassium 3.5 - 5.1 mmol/L 3.8 4.6 4.0  Chloride 98 - 111 mmol/L 103 101 102  CO2 22 - 32 mmol/L 26 24 26   Calcium 8.9 - 10.3 mg/dL 8.9 9.4 5.36)  Total Protein 6.5 - 8.1 g/dL 7.6 1.44) 7.6  Total Bilirubin 0.3 - 1.2 mg/dL 0.3 0.4 0.4  Alkaline Phos 38 - 126 U/L 110 124 97  AST 15 - 41 U/L 19 44(H) 19  ALT 0 - 44 U/L 38 68(H) 29     Imaging studies: No new pertinent imaging studies   Assessment/Plan:  24 y.o. male 30 Days Post-Op s/p closure of fascial dehiscence with internal and external retention sutrures24Days Post-Op s/p exploratory laparotomy and lysis of adhesions for small bowel obstruction secondary to internal hernia, complicated by pertinent comorbidities includings/p subtotal colectomy for reportedly Hirschsprung's disease however he does nothave a formal diagnosis of this and was pending outpatient work up with GI   - NGT removed this morning  - Continue CLD for now; advance very slowly, given history  - Continue IV Abx (Zosyn)  - Monitor abdominal examination; on-going bowel function  - May need to restart motility agents (Reglan +/- Erythromycin)    - Continue local wound care: wet to dry dressingwith vaselinedaily  - Pain control prn; antiemetics prn              - Mobilize  All of the above findings and recommendations were discussed with the patient, and the medical team, and all of patient's questions  were answered to his expressed satisfaction.

## 2020-08-30 DIAGNOSIS — K913 Postprocedural intestinal obstruction, unspecified as to partial versus complete: Secondary | ICD-10-CM

## 2020-08-30 MED ORDER — MELATONIN 5 MG PO TABS
5.0000 mg | ORAL_TABLET | Freq: Every day | ORAL | Status: DC
  Administered 2020-08-31 (×2): 5 mg via ORAL
  Filled 2020-08-30 (×2): qty 1

## 2020-08-30 NOTE — Progress Notes (Signed)
08/30/2020  Subjective: No acute events overnight.  Patient's NG tube was removed yesterday.  Was continued on a clear liquid diet.  Denies any nausea or emesis.  Denies any significant abdominal pain.  His ostomy continues to work and had 550 recorded output yesterday.  Vital signs: Temp:  [97.7 F (36.5 C)-98.6 F (37 C)] 97.9 F (36.6 C) (11/26 1114) Pulse Rate:  [72-82] 73 (11/26 1130) Resp:  [16-20] 16 (11/26 1114) BP: (86-100)/(51-66) 96/59 (11/26 1130) SpO2:  [100 %] 100 % (11/26 1114)   Intake/Output: 11/25 0701 - 11/26 0700 In: 1763.8 [I.V.:1592.4; IV Piggyback:171.5] Out: 1050 [Urine:500; Stool:550] Last BM Date: 08/30/20  Physical Exam: Constitutional: No acute distress Abdomen: Soft, nondistended, appropriately tender to palpation.  Midline incision healing via secondary intention superiorly and with sutures inferiorly.  Right lower quadrant end ileostomy patent and pink with gas and liquid stool in the bag.  Left-sided drain with serosanguineous fluid.  Labs:  Recent Labs    08/28/20 0451  WBC 6.2  HGB 8.6*  HCT 27.8*  PLT 448*   Recent Labs    08/28/20 0451  NA 139  K 3.8  CL 103  CO2 26  GLUCOSE 119*  BUN 17  CREATININE 0.98  CALCIUM 8.9   No results for input(s): LABPROT, INR in the last 72 hours.  Imaging: No results found.  Assessment/Plan: This is a 24 y.o. male status post exploratory laparotomy lysis of adhesions for small bowel obstruction on 08/02/2020, with subsequent take back to the OR on 08/09/2020 due to fascial dehiscence.  -We will advance the patient's diet to full liquid diet today.  Explained to the patient why not advance to a soft diet directly from clear liquids.  He understands this. -Continue IV antibiotics and percutaneous drain for intra-abdominal abscess. -Continue Reglan for bowel motility. -If he continues to do well, will advance his diet tomorrow and potentially DC him home tomorrow afternoon.   Thomas Ill,  MD [facility]

## 2020-08-31 MED ORDER — METOCLOPRAMIDE HCL 5 MG PO TABS: 5.0000 mg | ORAL_TABLET | Freq: Three times a day (TID) | ORAL | 0 refills | Status: DC

## 2020-08-31 MED ORDER — AMOXICILLIN-POT CLAVULANATE 875-125 MG PO TABS: 1.0000 | ORAL_TABLET | Freq: Two times a day (BID) | ORAL | 0 refills | Status: AC

## 2020-08-31 MED ORDER — SODIUM CHLORIDE 0.9% FLUSH: 5.0000 mL | Freq: Every day | INTRAVENOUS | 0 refills | Status: DC

## 2020-08-31 NOTE — Care Plan (Addendum)
Pt ate lunch and tolerated fair. Pt reports that when he stands his hiccups are back. MD Piscoya paged at this time. He states it only happens when he stands up. MD states to give thorazine.   Update: patient finished solid foods and the hiccups started. When patient was not eating, he did not have this problem. Thorazine ordered and given to patient. Medication did not help patient. Pt states he wants this to stop because he feels he will be back for readmission as this problem led to this readmission. MD Piscoya made aware. Awaiting response from MD. Colostomy put out 200cc Briget Shaheed/green stool. Zosyn given late as patient was to be d/c.

## 2020-08-31 NOTE — Discharge Summary (Addendum)
Patient ID: Taavi Hoose MRN: [id number] DOB/AGE: 10-14-1995 24 y.o.  Admit date: 08/27/2020 Discharge date: 09/01/2020   Discharge Diagnoses:  Active Problems:   Small bowel obstruction (HCC)   SBO (small bowel obstruction) (HCC)   Procedures:  Percutaneous abscess drainage catheter placement  Hospital Course:   Patient was admitted on 11/23 with small bowel obstruction and intra-abdominal abscess.  He's s/p recent exploratory laparotomy with lysis of adhesions and subsequent takeback to OR for fascial dehiscence.  Admission CT scan showed a small intra-abdominal fluid collection and IR placed a percutaneous drainage catheter.  He had an NG tube placed and was NPO.  He regained bowel function and his NG tube was removed and his diet was slowly advanced.  His pain was well controlled.  His drain had improved output.  His ostomy was working well.  He was deemed ready for discharge.    Patient will resume his prior pain medication prescriptions and will continue Reglan for bowel motility.  Will also add prescription for Augmentin course and for saline flushes for his drain.  On exam, patient was in no acute distress and with stable normal vital signs. His abdomen was soft, non-distended, non-tender.  The superior half of the wound has healthy granulation tissue, measuring about 2 x 6 cm.  The inferior portion is closed and has skin sutures.  The left sided percutaneous drain has serosanguinous fluid.  RLQ ostomy is pink/viable, with stool in bag.  Consults: IR  Disposition: Discharge disposition: 01-Home or Self Care       Discharge Instructions    Call MD for:  difficulty breathing, headache or visual disturbances   Complete by: As directed    Call MD for:  persistant nausea and vomiting   Complete by: As directed    Call MD for:  redness, tenderness, or signs of infection (pain, swelling, redness, odor or green/yellow discharge around incision site)   Complete by: As  directed    Call MD for:  severe uncontrolled pain   Complete by: As directed    Call MD for:  temperature >100.4   Complete by: As directed    Diet - low sodium heart healthy   Complete by: As directed    Discharge instructions   Complete by: As directed    1.  Patient may shower, but do not scrub wounds heavily and dab dry only. 2.  Do not submerge wounds in pool/tub until all fully healed and drain removed. 3.  Continue and complete antibiotic course. 4.  Wound Dressing:  Dry gauze dressing to cover the midline wound.  Change once daily and as needed to keep the area clean and dry. 5.  Drain Dressing:  Apply dry gauze at the drain site, cover with tape.  Change once daily to keep the area clean and dry. 6.  Flush drain once daily with 5 ml of saline solution.  Please record daily drain output and bring this with you to office appointment.   Driving Restrictions   Complete by: As directed    Do not drive while taking narcotics for pain control.   Increase activity slowly   Complete by: As directed    Lifting restrictions   Complete by: As directed    No heavy lifting or pushing of more than 10-15 lbs for 4 weeks.     Allergies as of 09/01/2020      Reactions   Lactose Intolerance (gi)       Medication List  TAKE these medications   acetaminophen 500 MG tablet Commonly known as: TYLENOL Take 2 tablets (1,000 mg total) by mouth every 6 (six) hours.   amoxicillin-clavulanate 875-125 MG tablet Commonly known as: Augmentin Take 1 tablet by mouth 2 (two) times daily for 7 days.   baclofen 10 MG tablet Commonly known as: LIORESAL Take 0.5 tablets (5 mg total) by mouth 3 (three) times daily as needed for muscle spasms (for hiccups).   ibuprofen 800 MG tablet Commonly known as: ADVIL Take 1 tablet (800 mg total) by mouth every 8 (eight) hours as needed. What changed: reasons to take this   metoCLOPramide 5 MG tablet Commonly known as: Reglan Take 1 tablet (5 mg total)  by mouth every 8 (eight) hours. What changed:   when to take this  reasons to take this   oxyCODONE 5 MG immediate release tablet Commonly known as: Oxy IR/ROXICODONE 1-2 tablets (5-10 mg total) by Per NG tube route every 4 (four) hours as needed for severe pain or breakthrough pain.   sodium chloride flush 0.9 % Soln Commonly known as: NS 5 mLs by Intracatheter route daily.       Follow-up Information    Pabon, [place], MD Follow up.   Specialty: General Surgery Why: Follow up with Dr. [name] in one week for wound check and possible drain removal. Contact information: [address] [place] [address] [phone]

## 2020-08-31 NOTE — Discharge Instructions (Signed)
Drain care: Unscrew drain as shown, instill 5 ml, reattach as shown and make sure drain is compressed back. Keep record of output in a journal and bring to office visit follow up.

## 2020-08-31 NOTE — Progress Notes (Signed)
Patient is complaining of difficulty sleeping. MD notified and new orders acknowledged.

## 2020-09-01 MED ORDER — BACLOFEN 10 MG PO TABS: 5.0000 mg | ORAL_TABLET | Freq: Three times a day (TID) | ORAL | 0 refills | Status: DC | PRN

## 2020-09-01 NOTE — Progress Notes (Signed)
Patient stated rose concern of when JP drain is flushed the fluids are coming out naval area. Notified Piscoya MD he stated discussed with patient and to proceed with  discharge paperwork. Patient right forearm IV taken out. Patient in room waiting for ride to go home.

## 2020-09-01 NOTE — Progress Notes (Signed)
09/01/2020  Subjective: Patient was due for discharge yesterday but he started having hiccups with ambulation.  Thorazine did not help and given that the hiccups this was first brought him back to the hospital on this readmission, we decided to keep him in the hospital last night instead.  Today he is doing very well with no issues.  The hiccups only happen when she gets up to walk.  Otherwise has been tolerating diet without issues and with continued ostomy function.  Denies any abdominal pain or nausea.  Vital signs: Temp:  [97.7 F (36.5 C)-98.7 F (37.1 C)] 98 F (36.7 C) (11/28 0806) Pulse Rate:  [77-99] 77 (11/28 0806) Resp:  [18-20] 18 (11/28 0806) BP: (90-103)/(50-69) 91/60 (11/28 0806) SpO2:  [99 %-100 %] 100 % (11/28 0806)   Intake/Output: 11/27 0701 - 11/28 0700 In: 0  Out: 955 [Urine:625; Drains:30; Stool:300] Last BM Date: 08/31/20  Physical Exam: Constitutional: No acute distress Abdomen: Soft, nondistended, nontender to palpation.  Midline incision has the superior portion open with granulation tissue healing very well with no real area for packing.  Inferior portion has retention sutures.  Left-sided drain with serosanguineous fluid.  Right-sided ostomy pink and viable with liquid stool in the bag.  Labs:  No results for input(s): WBC, HGB, HCT, PLT in the last 72 hours. No results for input(s): NA, K, CL, CO2, GLUCOSE, BUN, CREATININE, CALCIUM in the last 72 hours.  Invalid input(s): MAGNESIUM No results for input(s): LABPROT, INR in the last 72 hours.  Imaging: No results found.  Assessment/Plan: This is a 24 y.o. male readmitted for concerns of small bowel obstruction, status post recent exploratory laparotomy with lysis of adhesions and subsequent takeback for fascial dehiscence.  -Patient is doing very well with no further hiccups when in bed.  Hiccups are only when she walks.  He is feeling very well right now and I think is okay to discharge him home this  morning.  Discussed with him that he will have prescriptions for antibiotic course, for the flushes, he can continue the pain medications given on his last readmission.  As a precaution we will also give him a prescription for baclofen to see if that helps with any potential hiccups as outpatient. -Follow-up with Dr. [name] in 1 week   Howie Ill, MD [facility]

## 2020-09-02 ENCOUNTER — Encounter: Payer: Medicaid Other | Admitting: Surgery

## 2020-09-02 ENCOUNTER — Telehealth: Payer: Self-pay | Admitting: *Deleted

## 2020-09-02 ENCOUNTER — Other Ambulatory Visit: Payer: Self-pay

## 2020-09-02 LAB — AEROBIC/ANAEROBIC CULTURE W GRAM STAIN (SURGICAL/DEEP WOUND): Special Requests: NORMAL

## 2020-09-02 MED ORDER — SODIUM CHLORIDE 0.9% FLUSH: INTRAVENOUS | 0 refills | Status: DC

## 2020-09-02 MED ORDER — SODIUM CHLORIDE 0.9% FLUSH: 10.0000 mL | Freq: Three times a day (TID) | INTRAVENOUS | 0 refills | Status: DC

## 2020-09-02 NOTE — Telephone Encounter (Signed)
This patient called in regards to a prescription that was sent into his pharmacy. Sodium Chloride Flush. He stated that it was sent it wrong. It was the nose flush and not the syringes. Can you please let patient know when this is fixed.

## 2020-09-02 NOTE — Telephone Encounter (Signed)
Verbal order for prefilled sodium chloride syringes was placed at [facility] for patient. Patient was prescribed one box of 30 syringes of 10 mLs per Rosanne Ashing the pharmacy rep at [facility]. Patient information was provided for when prescription is ready for pick up. Spoke with [facility] and they stated [facility] facility [facility] any local [facility] carries the [facility] and to contact [facility] store. Spoke with [facility] and was informed they do not carry them either.

## 2020-09-04 ENCOUNTER — Telehealth: Payer: Self-pay | Admitting: Surgery

## 2020-09-04 NOTE — Telephone Encounter (Signed)
Flushes placed at front desk, patient will come to pick them up before closing today.

## 2020-09-04 NOTE — Telephone Encounter (Signed)
Patent calls and says that going to [place] at [facility] is too far for him to drive to get his sodium chloride syringes.  He is asking if we have any that we can give him or if there is a pharmacy closer for him to get these.  Please call patient.  Thank you.

## 2020-09-09 ENCOUNTER — Other Ambulatory Visit: Payer: Self-pay

## 2020-09-09 ENCOUNTER — Encounter: Payer: Self-pay | Admitting: Surgery

## 2020-09-09 ENCOUNTER — Ambulatory Visit (INDEPENDENT_AMBULATORY_CARE_PROVIDER_SITE_OTHER): Payer: Medicaid Other | Admitting: Surgery

## 2020-09-09 VITALS — BP 97/61 | HR 86 | Temp 98.1°F | Ht 71.0 in | Wt 125.0 lb

## 2020-09-09 DIAGNOSIS — Z9049 Acquired absence of other specified parts of digestive tract: Secondary | ICD-10-CM

## 2020-09-09 NOTE — Progress Notes (Signed)
Thomas Rivas 24 yo male prior history of subtotal colectomy with ileostomy Recently admitted for small bowel obstruction requiring lysis of adhesions.  Placated by fascial dehiscence abscess status post drain placement.   He is doing well.  Has good ostomy output.  No fever chills.  He is tolerating diet.  He drove himself today  PE NAD  lower half of incision completely closed with skin sutures removed ileostomy working with good pasty stool.  Upper is healing by secondary intention almost completely healed.  No peritonitis Drain in place with serous output  A/P doing well.  Prior to drain removal will obtain CT scan of the abdomen and pelvis.  Avoid any narcotics if possible. RTC after CT is done

## 2020-09-09 NOTE — Patient Instructions (Signed)
Scheduled CT scan 09/12/20. Please pick up your prep kit today from Radiology. Nothing to eat or drink 4 hours prior to having the scan. Please see your follow up appointment listed below.

## 2020-09-10 ENCOUNTER — Telehealth: Payer: Self-pay | Admitting: Surgery

## 2020-09-10 NOTE — Telephone Encounter (Signed)
Spoke with the patient and he is not sure when he will be returning to work. He will have restrictions of no lifting, pushing, or pulling of more than 20 pounds until after December 17th 2021. He is amendable to this and will call back with the fax information of where to send the letter to.

## 2020-09-10 NOTE — Telephone Encounter (Signed)
Patient calls stating that he needs a work note for his employer returning to work and to be noted whether or not if any restrictions.  Please call him when ready.  Thank you.

## 2020-09-11 NOTE — Telephone Encounter (Signed)
Patient was notified that his work note was faxed over. Patient verbalized understanding and has no further questions.

## 2020-09-11 NOTE — Telephone Encounter (Signed)
Patient calls back and the fax number to fax his work note to is [phone] "[employer]".  Patient also asked if you would call him to confirm that it has been faxed.  Thank you.

## 2020-09-12 ENCOUNTER — Ambulatory Visit: Admission: RE | Admit: 2020-09-12 | Payer: Medicaid Other | Source: Ambulatory Visit

## 2020-09-16 ENCOUNTER — Encounter: Payer: Self-pay | Admitting: Surgery

## 2020-09-26 ENCOUNTER — Ambulatory Visit
Admission: RE | Admit: 2020-09-26 | Discharge: 2020-09-26 | Disposition: A | Discharge status: Home / Self Care | Payer: Medicaid Other | Source: Ambulatory Visit | Attending: Surgery | Admitting: Surgery

## 2020-09-26 ENCOUNTER — Other Ambulatory Visit: Payer: Self-pay

## 2020-09-26 DIAGNOSIS — Z9049 Acquired absence of other specified parts of digestive tract: Secondary | ICD-10-CM | POA: Diagnosis present

## 2020-09-26 MED ORDER — IOHEXOL 300 MG/ML  SOLN
75.0000 mL | Freq: Once | INTRAMUSCULAR | Status: AC | PRN
  Administered 2020-09-26: 13:00:00 75 mL via INTRAVENOUS

## 2020-09-30 ENCOUNTER — Encounter: Payer: Self-pay | Admitting: Physician Assistant

## 2020-09-30 ENCOUNTER — Telehealth: Payer: Self-pay

## 2020-09-30 NOTE — Telephone Encounter (Signed)
Per Zach-call patient to discuss removal of drain-patient has no showed several times- need to know what he would like to do about his drain-   Tried reaching patient via phone- mailbox not set up and was unable to leave message-

## 2020-10-01 ENCOUNTER — Ambulatory Visit (INDEPENDENT_AMBULATORY_CARE_PROVIDER_SITE_OTHER): Payer: Medicaid Other | Admitting: Physician Assistant

## 2020-10-01 ENCOUNTER — Encounter: Payer: Self-pay | Admitting: Physician Assistant

## 2020-10-01 ENCOUNTER — Other Ambulatory Visit: Payer: Self-pay

## 2020-10-01 VITALS — BP 110/70 | HR 68 | Temp 98.0°F | Ht 71.0 in | Wt 130.0 lb

## 2020-10-01 DIAGNOSIS — Z9049 Acquired absence of other specified parts of digestive tract: Secondary | ICD-10-CM

## 2020-10-01 DIAGNOSIS — Z09 Encounter for follow-up examination after completed treatment for conditions other than malignant neoplasm: Secondary | ICD-10-CM

## 2020-10-01 DIAGNOSIS — Z932 Ileostomy status: Secondary | ICD-10-CM

## 2020-10-01 NOTE — Progress Notes (Signed)
[facility] POST-OP OFFICE VISIT  10/01/2020  HPI: Thomas Rivas is a 24 y.o. male ~ 2 months s/p initial exploratory laparotomy and extensive lysis of adhesions complicated by fascial dehiscence and take back on 11/05. He was last seen as an outpatient on 12/06 with Dr [name] with plans for CT Abdomen/Pelvis and subsequent drain removal. He had CT on 12/23 which has shown resolution in intra-abdominal fluid collections. He is doing well. No complaints. His drain is putting out less than 1-2 ccs daily of serous fluid.   Vital signs: BP 110/70   Pulse 68   Temp 98 F (36.7 C)   Ht 5\' 11"  (1.803 m)   Wt 130 lb (59 kg)   SpO2 98%   BMI 18.13 kg/m    Physical Exam: Constitutional: Well appearing male, NAD Abdomen: Soft, non-tender, non-distended, no rebound/guarding. 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 Drain in LLQ with minimal serous fluid Skin: Laparotomy incision has healed well, no erythema or drainage   Imaging studies reviewed:   CT Abdomen/Pelvis (09/26/2020) personally reviewed showing resolution of intra-abdominal fluid collections, and radiologist report reviewed:    Assessment/Plan: This is a 24 y.o. male ~ 2 months s/p initial exploratory laparotomy and extensive lysis of adhesions complicated by fascial dehiscence and take back on 11/05   - Drain removed; occlusive dressing placed, reviewed wound care  - He will follow up on an as needed basis  -- 13/05, PA-C [facility] 10/01/2020, 1:42 PM [phone] M-F: 7am - 4pm

## 2020-10-14 ENCOUNTER — Telehealth: Payer: Self-pay | Admitting: Surgery

## 2020-10-14 NOTE — Telephone Encounter (Signed)
Patient instructed to come to office to see if we have a bag that will fit and to call [facility] to request more bags.

## 2020-10-14 NOTE — Telephone Encounter (Signed)
Patient is calling and said he has a colostomy bag and is needing more bags, where he normally gets them they are out of, do we have bags or able to tell him where to go to get some. Please call patient and advise.

## 2020-10-24 ENCOUNTER — Emergency Department (HOSPITAL_COMMUNITY)
Admission: EM | Admit: 2020-10-24 | Discharge: 2020-10-24 | Discharge status: Against Medical Advice | Payer: Medicaid Other

## 2020-10-24 ENCOUNTER — Other Ambulatory Visit: Payer: Self-pay

## 2020-10-24 NOTE — ED Notes (Signed)
Called and unable to locate in lobby for triage.

## 2021-10-26 IMAGING — CR DG ABDOMEN 2V
2 series · 2 of 2 positions shown · non-contrast
Comparison: None.

CLINICAL DATA: Nausea and watery stool. Recent total colectomy with
ileostomy in [place].

EXAM:
ABDOMEN - 2 VIEW

[w abdomen upright]
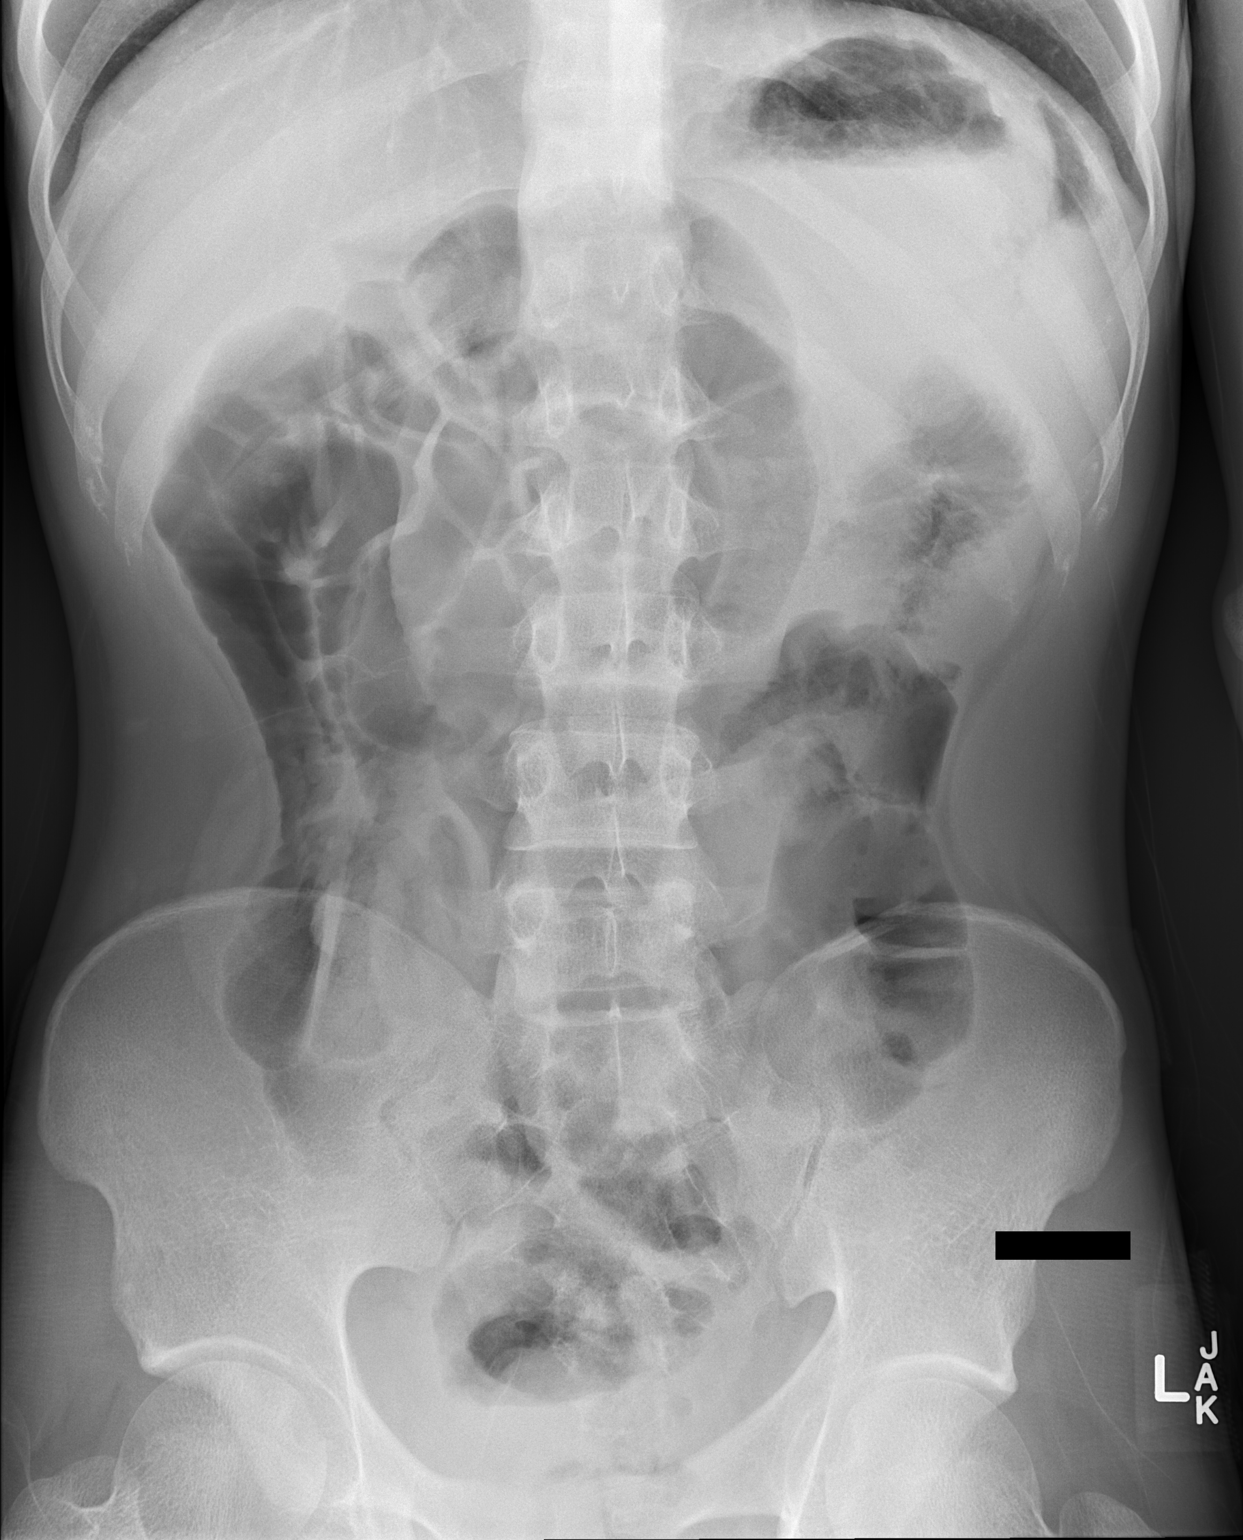

[t abdomen supine]
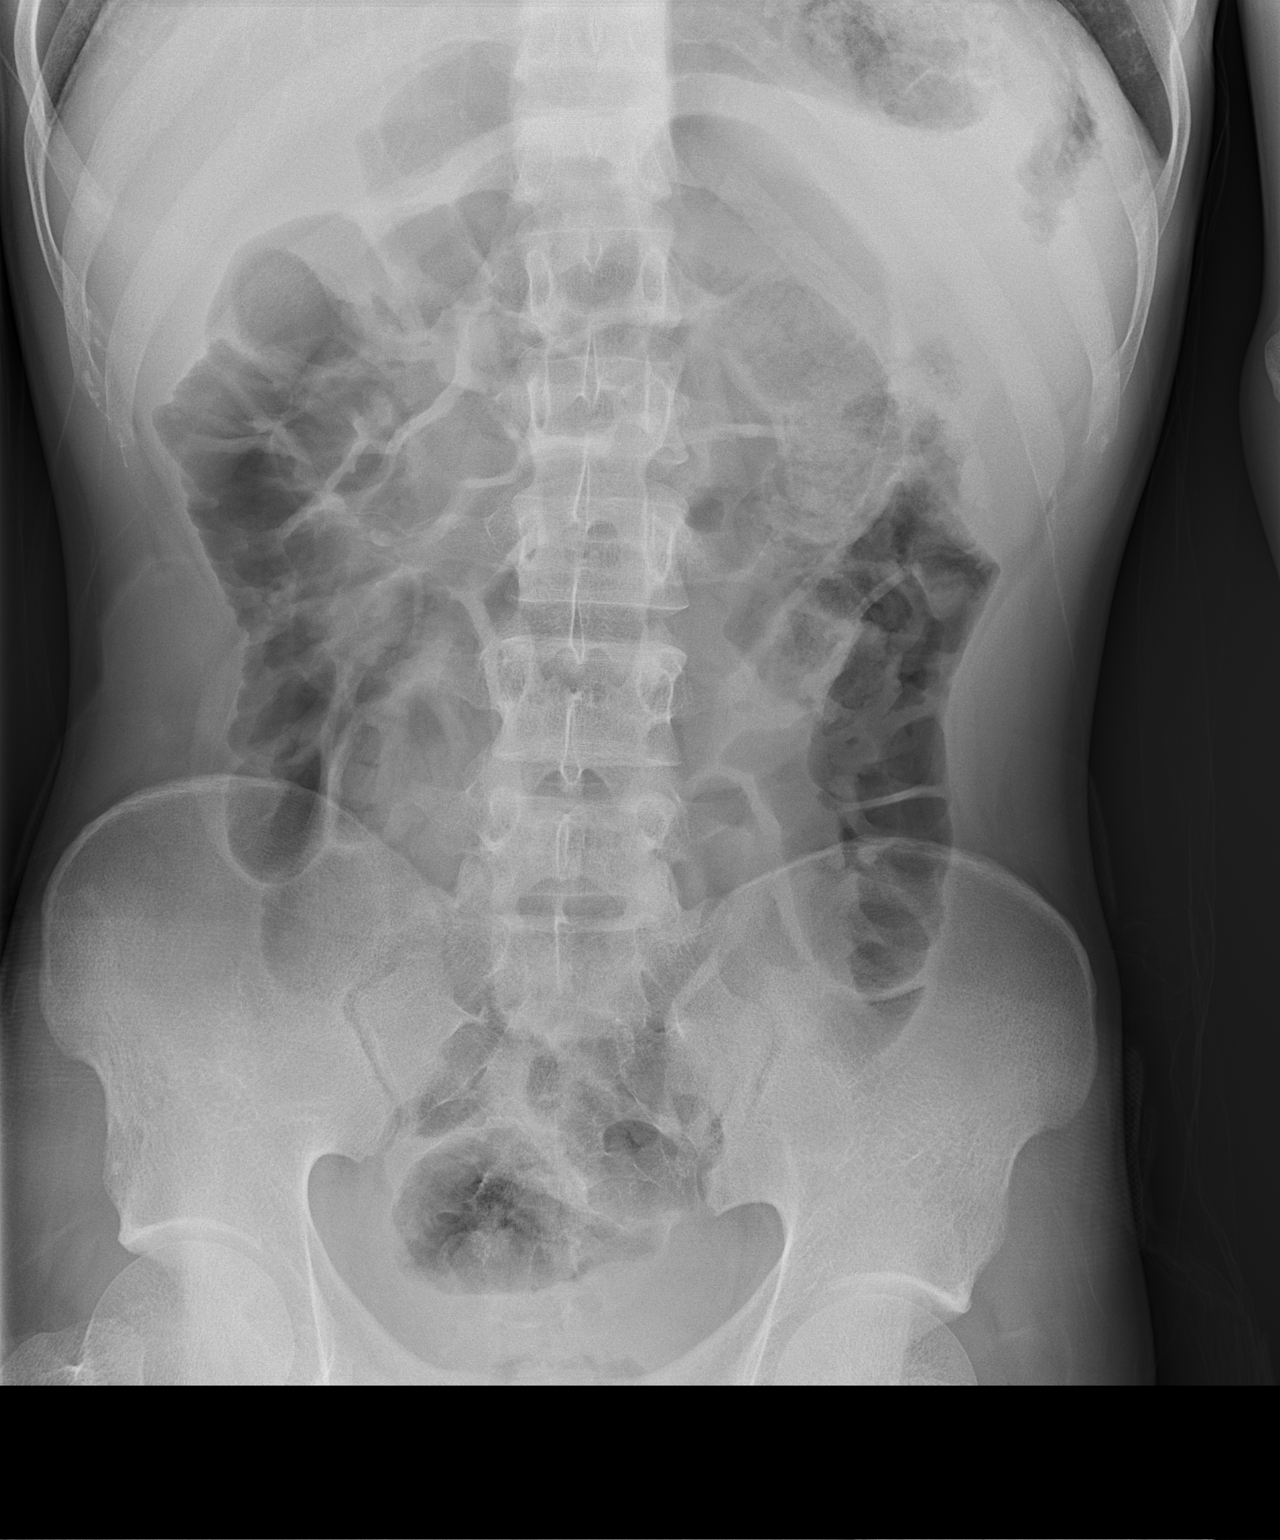

[2 of 2 positions shown; findings below may reference images not displayed]

FINDINGS: The bowel gas pattern is normal. There is no evidence of free air.
No radio-opaque calculi or other significant radiographic
abnormality is seen. No acute osseous abnormality.
IMPRESSION: Negative.

## 2022-03-25 IMAGING — CT CT IMAGE GUIDED DRAINAGE BY PERCUTANEOUS CATHETER
1 of 2 series · 15 of 32 positions shown, 19 images · non-contrast
Comparison: none

CLINICAL DATA: Previous colectomy and ostomy take-down. Loculated
peritoneal fluid collection left lower quadrant postop.

[Series 2: i-spiral 5.0 b30f · axial · 0.59mm/px · z∈[-265,-118]mm · 15 of 46 slices shown, 19 images]
[im 2/46  soft-tissue]
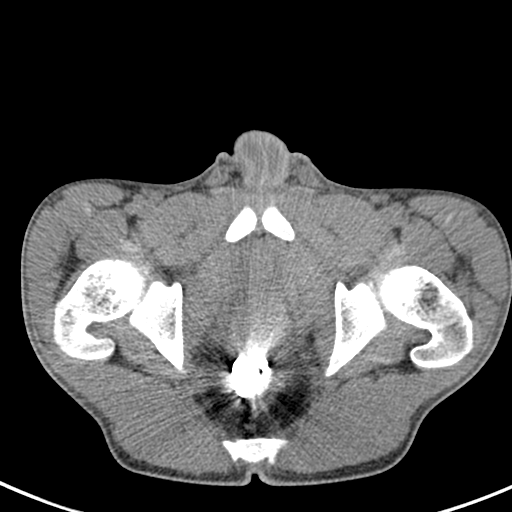
[im 2/46  bone]
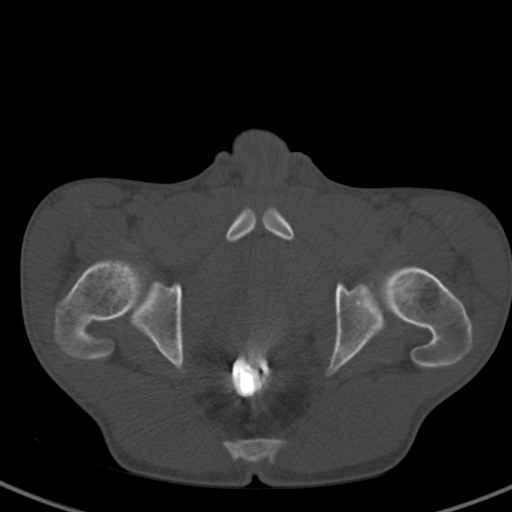
[im 6/46  soft-tissue]
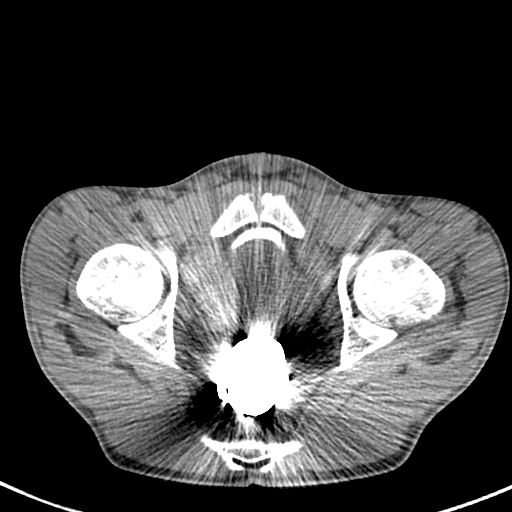
[im 9/46  soft-tissue]
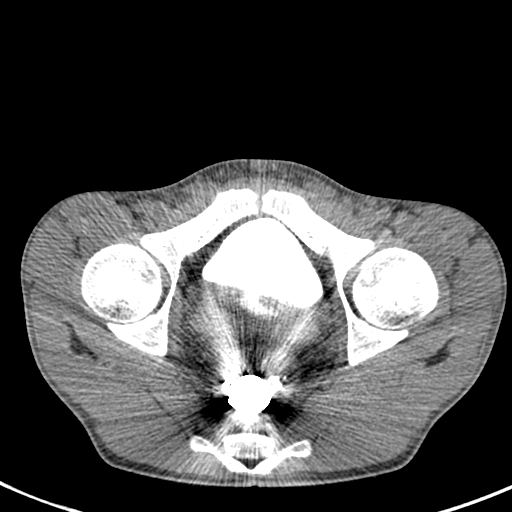
[im 13/46  soft-tissue]
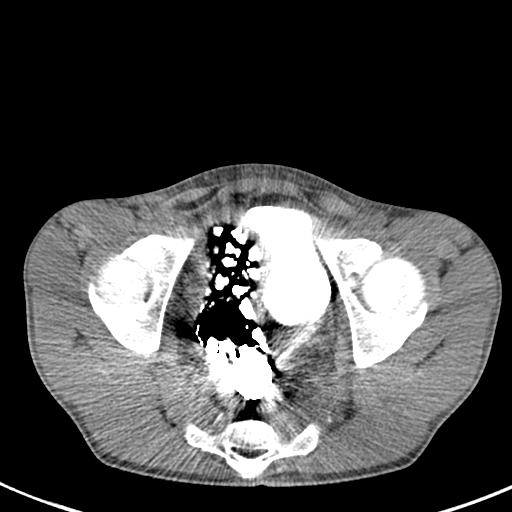
[im 16/46  soft-tissue]
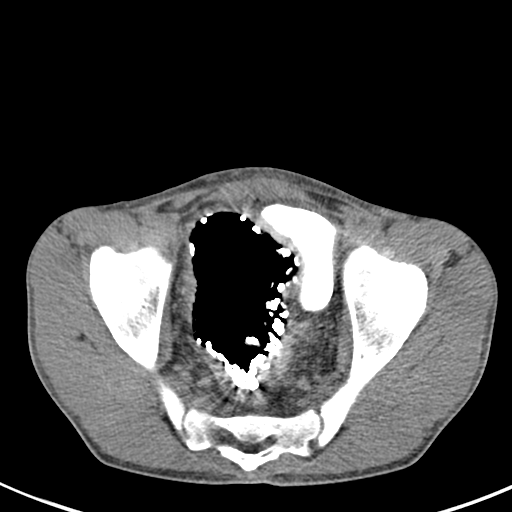
[im 20/46  soft-tissue]
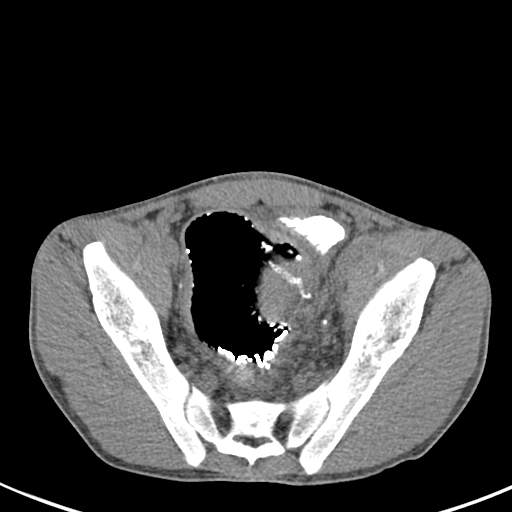
[im 23/46  soft-tissue]
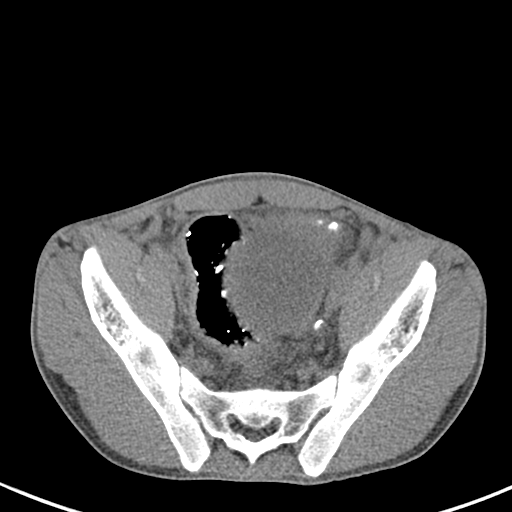
[im 26/46  soft-tissue]
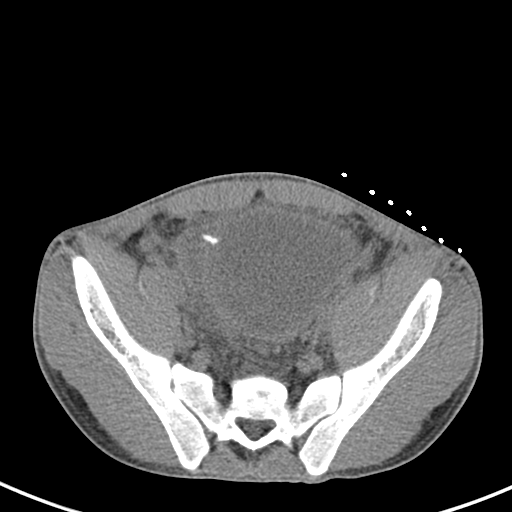
[im 30/46  soft-tissue]
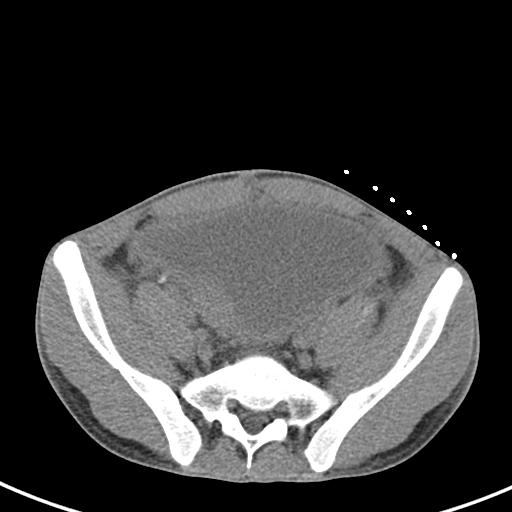
[im 30/46  bone]
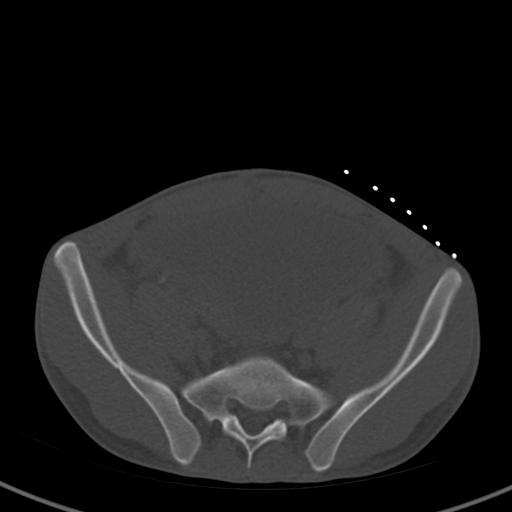
[im 33/46  soft-tissue]
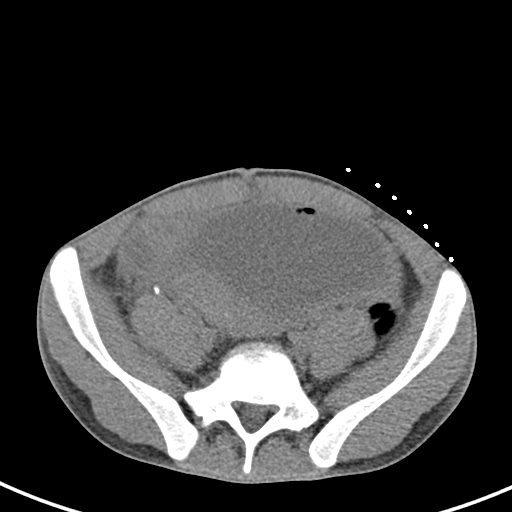
[im 37/46  soft-tissue]
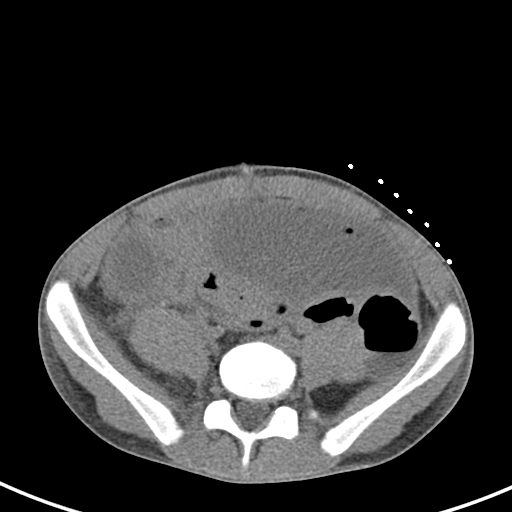
[im 39/46  lung]
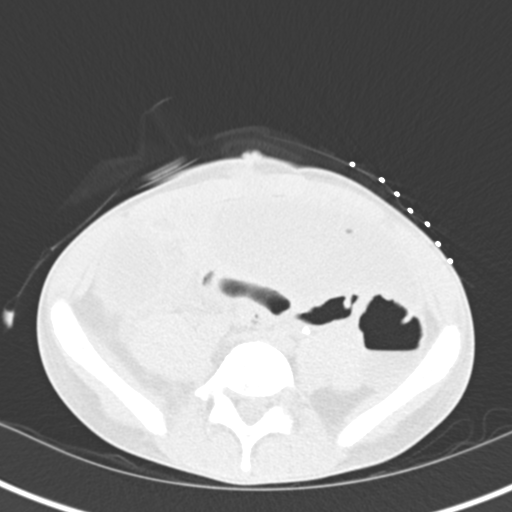
[im 40/46  soft-tissue]
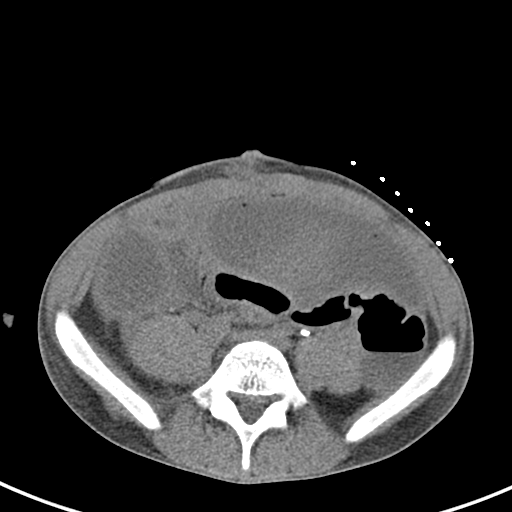
[im 40/46  lung]
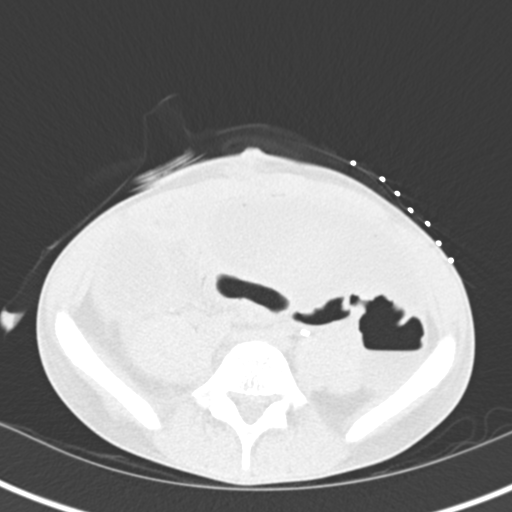
[im 42/46  lung]
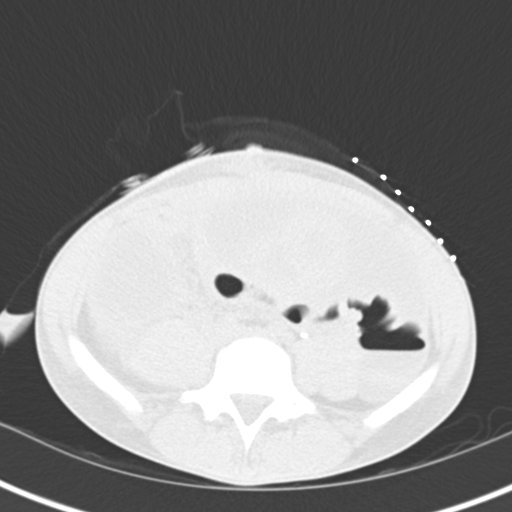
[im 44/46  soft-tissue]
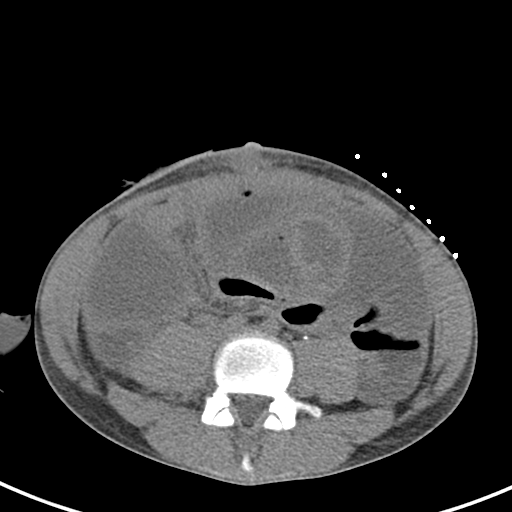
[im 44/46  lung]
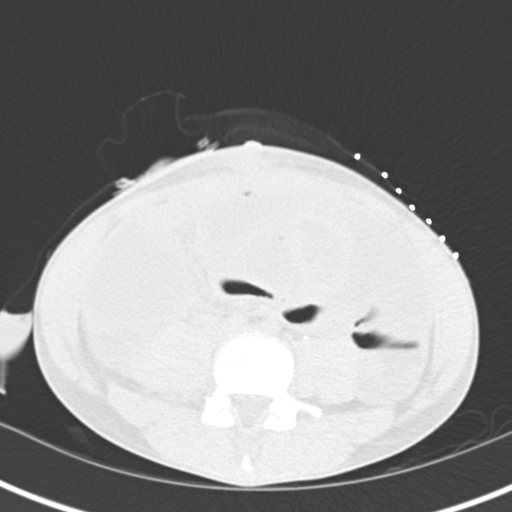

[15 of 32 positions shown; findings below may reference images not displayed]

EXAM:
CT GUIDED DRAINAGE OF PERITONEAL ABSCESS

ANESTHESIA/SEDATION:
Intravenous Fentanyl 77mcg and Versed 1mg were administered as
conscious sedation during continuous monitoring of the patient's
level of consciousness and physiological / cardiorespiratory status
by the radiology RN, with a total moderate sedation time of 13
minutes.

PROCEDURE:
The procedure, risks, benefits, and alternatives were explained to
the patient. Questions regarding the procedure were encouraged and
answered. The patient understands and consents to the procedure.

Select axial scans through the lower abdomen were obtained. The left
lower quadrant/pelvic dominant loculated fluid collection was
localized and an appropriate skin entry site was determined and
marked.

The operative field was prepped with chlorhexidinein a sterile
fashion, and a sterile drape was applied covering the operative
field. A sterile gown and sterile gloves were used for the
procedure. Local anesthesia was provided with 1% Lidocaine.

Under CT fluoroscopic guidance, 18 gauge trocar needle advanced to
the collection. Thin blood-tinged fluid could be aspirated. Amplatz
guidewire advanced easily, position confirmed on CT. Tract dilated
to facilitate placement of a 14 French pigtail drain catheter,
formed within the central dependent aspect of the collection. 20 mL
of blood tinged aspirate sent for Gram stain and culture. The
catheter was secured externally with 0 Prolene suture and placed to
gravity drain bag. The patient tolerated the procedure well.

COMPLICATIONS:
None immediate
FINDINGS: Loculated left lower quadrant/pelvic peritoneal fluid collection was
localized. 12 French pigtail drain catheter placed as above. 20 mL
aspirate sent for Gram stain and culture.
IMPRESSION: Technically successful CT-guided left lower quadrant abscess drain
catheter placement.

## 2022-12-17 ENCOUNTER — Ambulatory Visit
Admission: EM | Admit: 2022-12-17 | Discharge: 2022-12-17 | Disposition: A | Discharge status: Home / Self Care | Payer: Medicaid Other | Attending: Physician Assistant | Admitting: Physician Assistant

## 2022-12-17 DIAGNOSIS — A5401 Gonococcal cystitis and urethritis, unspecified: Secondary | ICD-10-CM | POA: Insufficient documentation

## 2022-12-17 DIAGNOSIS — Z113 Encounter for screening for infections with a predominantly sexual mode of transmission: Secondary | ICD-10-CM | POA: Insufficient documentation

## 2022-12-17 MED ORDER — CEFTRIAXONE SODIUM 500 MG IJ SOLR
500.0000 mg | INTRAMUSCULAR | Status: DC
  Administered 2022-12-17: 500 mg via INTRAMUSCULAR

## 2022-12-17 MED ORDER — DOXYCYCLINE HYCLATE 100 MG PO CAPS: 100.0000 mg | ORAL_CAPSULE | Freq: Two times a day (BID) | ORAL | 0 refills | Status: AC

## 2022-12-17 NOTE — ED Provider Notes (Signed)
[facility]    CSN: [id number] Arrival date & time: 12/17/22  1233      History   Chief Complaint Chief Complaint  Patient presents with   Exposure to STD    HPI Thomas Rivas is a 27 y.o. male.   27 year old male presents with penile discharge and STI testing.  Patient indicates that his girlfriend advised him that she had tested positive for gonorrhea and chlamydia.  Patient indicates for the past 2 to 3 days he has been having mild penile discharge with occasional burning on urination.  Patient indicates last time he had intercourse was about 3-4 days ago unprotected.  He request STI testing.  He is without fever, chills, nausea or vomiting.   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    Past Surgical History:  Procedure Laterality Date   ABDOMINAL WOUND DEHISCENCE N/A 08/09/2020   Procedure: ABDOMINAL WOUND DEHISCENCE REPAIR;  Surgeon: Jules Husbands, MD;  Location: [facility] ORS;  Service: General;  Laterality: N/A;   COLON SURGERY  12/21/2019   abdominal colectomy w/ileostomy   EXPLORATORY LAPAROTOMY  12/25/2019   GASTROJEJUNOSTOMY  12/31/2019   LAPAROTOMY N/A 08/02/2020   Procedure: EXPLORATORY LAPAROTOMY;  Surgeon: Jules Husbands, MD;  Location: [facility] ORS;  Service: General;  Laterality: N/A;       Home Medications    Prior to Admission medications   Medication Sig Start Date End Date Taking? Authorizing Provider  doxycycline  (VIBRAMYCIN) 100 MG capsule Take 1 capsule (100 mg total) by mouth 2 (two) times daily. 12/17/22  Yes Nyoka Lint, PA-C    Family History History reviewed. No pertinent family history.  Social History Social History   Tobacco Use   Smoking status: Some Days    Types: Cigarettes   Smokeless tobacco: Never   Tobacco comments:    1-2 cigarettes 2-3 days per week.  Vaping Use   Vaping Use: Never used  Substance Use Topics   Alcohol use: Not Currently   Drug use: Yes    Types: Marijuana     Allergies   Lactose intolerance (gi)   Review of Systems Review of Systems   Physical Exam Triage Vital Signs ED Triage Vitals  Enc Vitals Group     BP 12/17/22 1302 104/72     Pulse Rate 12/17/22 1302 (!) 110     Resp 12/17/22 1302 12     Temp 12/17/22 1302 98.2 F (36.8 C)     Temp Source 12/17/22 1302 Oral     SpO2 12/17/22 1302 97 %     Weight --      Height --      Head Circumference --      Peak Flow --      Pain Score 12/17/22 1301 4     Pain Loc --      Pain Edu? --      Excl. in Page? --    No data found.  Updated  Vital Signs BP 104/72 (BP Location: Left Arm)   Pulse (!) 110   Temp 98.2 F (36.8 C) (Oral)   Resp 12   SpO2 97%   Visual Acuity Right Eye Distance:   Left Eye Distance:   Bilateral Distance:    Right Eye Near:   Left Eye Near:    Bilateral Near:     Physical Exam Constitutional:      Appearance: Normal appearance.  Neurological:     Mental Status: He is alert.      UC Treatments / Results  Labs (all labs ordered are listed, but only abnormal results are displayed) Labs Reviewed  CYTOLOGY, (ORAL, ANAL, URETHRAL) ANCILLARY ONLY    EKG   Radiology No results found.  Procedures Procedures (including critical care time)  Medications Ordered in UC Medications  cefTRIAXone (ROCEPHIN) injection 500 mg (has no administration in time range)    Initial Impression / Assessment and Plan / UC Course  I have reviewed the triage  vital signs and the nursing notes.  Pertinent labs & imaging results that were available during my care of the patient were reviewed by me and considered in my medical decision making (see chart for details).    Plan: This will be treated with the following: 1.  Gonococcal urethritis: A.  Rocephin 500 mg IM given. B.  Doxycycline 100 mg every 12 hours to treat associated chlamydia. 2.  Screening for STI: A.  Treatment may be modified depending on results of the STI testing. 3.  Advised follow-up PCP return to urgent care as needed. Final Clinical Impressions(s) / UC Diagnoses   Final diagnoses:  Urethritis, gonococcal, acute  Routine screening for STI (sexually transmitted infection)     Discharge Instructions      Be completed in 48 hours.  If you do not get a call from this office that indicates the test are negative.  Log onto MyChart to be the test results when they post in 48 hours.  Advised take the doxycycline 100 mg every 12 hours until completed as this will treat infection.  Advised follow-up PCP or return to urgent care as needed.    ED Prescriptions     Medication Sig Dispense Auth. Provider   doxycycline (VIBRAMYCIN) 100 MG capsule Take 1 capsule (100 mg total) by mouth 2 (two) times daily. 20 capsule Nyoka Lint, PA-C      PDMP not reviewed this encounter.   Nyoka Lint, PA-C 12/17/22 1320

## 2022-12-17 NOTE — ED Triage Notes (Signed)
Pt partner tested positive for gonorrhea yesterday. Pt states he is having discharge from penis , painful urination x 2days

## 2022-12-17 NOTE — Discharge Instructions (Signed)
Be completed in 48 hours.  If you do not get a call from this office that indicates the test are negative.  Log onto MyChart to be the test results when they post in 48 hours.  Advised take the doxycycline 100 mg every 12 hours until completed as this will treat infection.  Advised follow-up PCP or return to urgent care as needed.

## 2022-12-18 LAB — CYTOLOGY, (ORAL, ANAL, URETHRAL) ANCILLARY ONLY
Chlamydia: NEGATIVE
Comment: NEGATIVE
Comment: NEGATIVE
Comment: NORMAL
Neisseria Gonorrhea: NEGATIVE
Trichomonas: NEGATIVE
